# Patient Record
Sex: Female | Born: 1985 | Race: White | Hispanic: No | Marital: Single | Smoking: Current every day smoker
Health system: Southern US, Community
[De-identification: ages and names within clinical notes are randomized; demographics above are authoritative.]

## PROBLEM LIST (undated history)

## (undated) ENCOUNTER — Inpatient Hospital Stay (HOSPITAL_COMMUNITY): Payer: Self-pay

## (undated) VITALS — BP 104/74 | HR 102 | Temp 98.2°F | Resp 16 | Ht 63.0 in | Wt 116.0 lb

## (undated) DIAGNOSIS — R112 Nausea with vomiting, unspecified: Secondary | ICD-10-CM

## (undated) DIAGNOSIS — N390 Urinary tract infection, site not specified: Secondary | ICD-10-CM

## (undated) DIAGNOSIS — F329 Major depressive disorder, single episode, unspecified: Secondary | ICD-10-CM

## (undated) DIAGNOSIS — J45909 Unspecified asthma, uncomplicated: Secondary | ICD-10-CM

## (undated) DIAGNOSIS — R87629 Unspecified abnormal cytological findings in specimens from vagina: Secondary | ICD-10-CM

## (undated) DIAGNOSIS — G43909 Migraine, unspecified, not intractable, without status migrainosus: Secondary | ICD-10-CM

## (undated) DIAGNOSIS — N809 Endometriosis, unspecified: Secondary | ICD-10-CM

## (undated) DIAGNOSIS — F32A Depression, unspecified: Secondary | ICD-10-CM

## (undated) DIAGNOSIS — IMO0002 Reserved for concepts with insufficient information to code with codable children: Secondary | ICD-10-CM

## (undated) DIAGNOSIS — Z9889 Other specified postprocedural states: Secondary | ICD-10-CM

## (undated) DIAGNOSIS — F4310 Post-traumatic stress disorder, unspecified: Secondary | ICD-10-CM

## (undated) DIAGNOSIS — F419 Anxiety disorder, unspecified: Secondary | ICD-10-CM

## (undated) HISTORY — PX: WISDOM TOOTH EXTRACTION: SHX21

## (undated) HISTORY — PX: OTHER SURGICAL HISTORY: SHX169

## (undated) HISTORY — DX: Migraine, unspecified, not intractable, without status migrainosus: G43.909

## (undated) HISTORY — DX: Unspecified abnormal cytological findings in specimens from vagina: R87.629

## (undated) HISTORY — DX: Reserved for concepts with insufficient information to code with codable children: IMO0002

---
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
## 2005-04-16 ENCOUNTER — Emergency Department (HOSPITAL_COMMUNITY): Admission: EM | Admit: 2005-04-16 | Discharge: 2005-04-16 | Payer: Self-pay | Admitting: Emergency Medicine

## 2005-05-20 ENCOUNTER — Emergency Department (HOSPITAL_COMMUNITY): Admission: EM | Admit: 2005-05-20 | Discharge: 2005-05-21 | Payer: Self-pay | Admitting: Emergency Medicine

## 2006-07-09 ENCOUNTER — Encounter: Admission: RE | Admit: 2006-07-09 | Discharge: 2006-08-06 | Payer: Self-pay | Admitting: Family Medicine

## 2007-07-09 ENCOUNTER — Ambulatory Visit (HOSPITAL_COMMUNITY): Admission: RE | Admit: 2007-07-09 | Discharge: 2007-07-09 | Payer: Self-pay | Admitting: Pediatrics

## 2007-07-16 ENCOUNTER — Ambulatory Visit (HOSPITAL_COMMUNITY): Admission: RE | Admit: 2007-07-16 | Discharge: 2007-07-16 | Payer: Self-pay | Admitting: Pediatrics

## 2008-05-25 ENCOUNTER — Other Ambulatory Visit: Admission: RE | Admit: 2008-05-25 | Discharge: 2008-05-25 | Payer: Self-pay | Admitting: Obstetrics and Gynecology

## 2009-04-21 DIAGNOSIS — IMO0002 Reserved for concepts with insufficient information to code with codable children: Secondary | ICD-10-CM

## 2009-04-21 DIAGNOSIS — R87619 Unspecified abnormal cytological findings in specimens from cervix uteri: Secondary | ICD-10-CM

## 2009-04-21 HISTORY — DX: Reserved for concepts with insufficient information to code with codable children: IMO0002

## 2009-04-21 HISTORY — DX: Unspecified abnormal cytological findings in specimens from cervix uteri: R87.619

## 2010-01-08 ENCOUNTER — Emergency Department (HOSPITAL_COMMUNITY): Admission: EM | Admit: 2010-01-08 | Discharge: 2010-01-08 | Payer: Self-pay | Admitting: Emergency Medicine

## 2010-01-28 ENCOUNTER — Emergency Department (HOSPITAL_COMMUNITY): Admission: EM | Admit: 2010-01-28 | Discharge: 2010-01-29 | Payer: Self-pay | Admitting: Emergency Medicine

## 2010-07-04 LAB — POCT PREGNANCY, URINE: Preg Test, Ur: NEGATIVE

## 2011-01-21 ENCOUNTER — Emergency Department (HOSPITAL_COMMUNITY)
Admission: EM | Admit: 2011-01-21 | Discharge: 2011-01-21 | Disposition: A | Discharge status: Home / Self Care | Payer: Self-pay | Attending: Emergency Medicine | Admitting: Emergency Medicine

## 2011-01-21 DIAGNOSIS — J40 Bronchitis, not specified as acute or chronic: Secondary | ICD-10-CM | POA: Insufficient documentation

## 2011-01-21 DIAGNOSIS — L509 Urticaria, unspecified: Secondary | ICD-10-CM | POA: Insufficient documentation

## 2011-01-21 DIAGNOSIS — F17200 Nicotine dependence, unspecified, uncomplicated: Secondary | ICD-10-CM | POA: Insufficient documentation

## 2011-01-21 DIAGNOSIS — F411 Generalized anxiety disorder: Secondary | ICD-10-CM | POA: Insufficient documentation

## 2011-01-27 ENCOUNTER — Emergency Department (HOSPITAL_COMMUNITY)
Admission: EM | Admit: 2011-01-27 | Discharge: 2011-01-27 | Disposition: A | Discharge status: Home / Self Care | Payer: Self-pay | Attending: Emergency Medicine | Admitting: Emergency Medicine

## 2011-01-27 DIAGNOSIS — L509 Urticaria, unspecified: Secondary | ICD-10-CM | POA: Insufficient documentation

## 2011-01-27 DIAGNOSIS — F411 Generalized anxiety disorder: Secondary | ICD-10-CM | POA: Insufficient documentation

## 2011-04-24 DIAGNOSIS — F329 Major depressive disorder, single episode, unspecified: Secondary | ICD-10-CM | POA: Insufficient documentation

## 2011-04-24 DIAGNOSIS — F32A Depression, unspecified: Secondary | ICD-10-CM | POA: Insufficient documentation

## 2011-04-24 DIAGNOSIS — F411 Generalized anxiety disorder: Secondary | ICD-10-CM | POA: Insufficient documentation

## 2011-04-24 DIAGNOSIS — F410 Panic disorder [episodic paroxysmal anxiety] without agoraphobia: Secondary | ICD-10-CM | POA: Insufficient documentation

## 2011-12-31 ENCOUNTER — Emergency Department (HOSPITAL_COMMUNITY)
Admission: EM | Admit: 2011-12-31 | Discharge: 2011-12-31 | Disposition: A | Discharge status: Home / Self Care | Payer: Self-pay | Attending: Emergency Medicine | Admitting: Emergency Medicine

## 2011-12-31 ENCOUNTER — Emergency Department (HOSPITAL_COMMUNITY): Payer: Self-pay

## 2011-12-31 ENCOUNTER — Encounter (HOSPITAL_COMMUNITY): Payer: Self-pay | Admitting: Emergency Medicine

## 2011-12-31 DIAGNOSIS — M549 Dorsalgia, unspecified: Secondary | ICD-10-CM | POA: Insufficient documentation

## 2011-12-31 DIAGNOSIS — R109 Unspecified abdominal pain: Secondary | ICD-10-CM | POA: Insufficient documentation

## 2011-12-31 DIAGNOSIS — R300 Dysuria: Secondary | ICD-10-CM | POA: Insufficient documentation

## 2011-12-31 DIAGNOSIS — R10819 Abdominal tenderness, unspecified site: Secondary | ICD-10-CM | POA: Insufficient documentation

## 2011-12-31 DIAGNOSIS — J3489 Other specified disorders of nose and nasal sinuses: Secondary | ICD-10-CM | POA: Insufficient documentation

## 2011-12-31 HISTORY — DX: Depression, unspecified: F32.A

## 2011-12-31 HISTORY — DX: Post-traumatic stress disorder, unspecified: F43.10

## 2011-12-31 HISTORY — DX: Anxiety disorder, unspecified: F41.9

## 2011-12-31 HISTORY — DX: Major depressive disorder, single episode, unspecified: F32.9

## 2011-12-31 HISTORY — DX: Urinary tract infection, site not specified: N39.0

## 2011-12-31 LAB — BASIC METABOLIC PANEL
Calcium: 9 mg/dL (ref 8.4–10.5)
Creatinine, Ser: 0.81 mg/dL (ref 0.50–1.10)
GFR calc Af Amer: >90 mL/min (ref >90)
GFR calc non Af Amer: >90 mL/min (ref >90)
Sodium: 137 mEq/L (ref 135–145)

## 2011-12-31 LAB — CBC WITH DIFFERENTIAL/PLATELET
Basophils Absolute: 0 10*3/uL (ref 0.0–0.1)
Basophils Relative: 1 % (ref 0–1)
Eosinophils Relative: 3 % (ref 0–5)
HCT: 40.5 % (ref 36.0–46.0)
MCHC: 35.1 g/dL (ref 30.0–36.0)
MCV: 89.4 fL (ref 78.0–100.0)
Monocytes Absolute: 0.5 10*3/uL (ref 0.1–1.0)
Platelets: 239 10*3/uL (ref 150–400)
RDW: 12.2 % (ref 11.5–15.5)
WBC: 8.7 10*3/uL (ref 4.0–10.5)

## 2011-12-31 LAB — URINALYSIS, ROUTINE W REFLEX MICROSCOPIC
Ketones, ur: NEGATIVE mg/dL
Leukocytes, UA: NEGATIVE
Nitrite: NEGATIVE
Protein, ur: NEGATIVE mg/dL
Urobilinogen, UA: 0.2 mg/dL (ref 0.0–1.0)

## 2011-12-31 LAB — WET PREP, GENITAL: Clue Cells Wet Prep HPF POC: NONE SEEN

## 2011-12-31 MED ORDER — ONDANSETRON 8 MG PO TBDP
8.0000 mg | ORAL_TABLET | Freq: Once | ORAL | Status: AC
  Administered 2011-12-31: 8 mg via ORAL
  Filled 2011-12-31: qty 1

## 2011-12-31 MED ORDER — OXYCODONE-ACETAMINOPHEN 5-325 MG PO TABS: 1.0000 | ORAL_TABLET | Freq: Four times a day (QID) | ORAL | Status: AC | PRN

## 2011-12-31 MED ORDER — SODIUM CHLORIDE 0.9 % IV BOLUS (SEPSIS)
1000.0000 mL | Freq: Once | INTRAVENOUS | Status: AC
  Administered 2011-12-31: 1000 mL via INTRAVENOUS

## 2011-12-31 MED ORDER — OXYCODONE-ACETAMINOPHEN 5-325 MG PO TABS
2.0000 | ORAL_TABLET | Freq: Once | ORAL | Status: AC
  Administered 2011-12-31: 2 via ORAL
  Filled 2011-12-31: qty 2

## 2011-12-31 MED ORDER — DOXYCYCLINE HYCLATE 100 MG PO CAPS: 100.0000 mg | ORAL_CAPSULE | Freq: Two times a day (BID) | ORAL | Status: AC

## 2011-12-31 NOTE — ED Provider Notes (Signed)
Medical screening examination/treatment/procedure(s) were conducted as a shared visit with non-physician practitioner(s) and myself.  I personally evaluated the patient during the encounter  Toy Baker, MD 12/31/11 2024

## 2011-12-31 NOTE — ED Provider Notes (Signed)
History     CSN: [id number]  Arrival date & time 12/31/11  1042   First MD Initiated Contact with Patient 12/31/11 1146      Chief Complaint  Patient presents with  . Urinary Tract Infection  . Pyelonephritis    (Consider location/radiation/quality/duration/timing/severity/associated sxs/prior treatment) HPI Comments: Pamela Gallagher 26 y.o. female   The chief complaint is: Patient presents with:   Urinary Tract Infection   Pyelonephritis   The patient has medical history significant for:   Past Medical History:   UTI (lower urinary tract infection)                          PTSD (post-traumatic stress disorder)                        Anxiety and depression                                      Patient presents with urinary symptoms of dysuria, frequency, urgency for 1.5 months. Patiens state she took 2 different antibiotics, one she can't remember and 10 days of Levaquin. Associated symptoms include nausea, back pain, and hematuria. LMP: now. Sexually active with one partner, does not use barrier. Denies fever or chills. Denies vomiting and diarrhea.  Patient also reports productive cough with greenish sputum, an congestion and sinus pressure and believes she has a sinus infection. She has had sinus infections in the past and this seems similar. Denies sore throat but reports mild epsitaxis.       The history is provided by the patient.    Past Medical History  Diagnosis Date  . UTI (lower urinary tract infection)   . PTSD (post-traumatic stress disorder)   . Anxiety and depression     Past Surgical History  Procedure Date  . Thyroglossoduct cyst     x 2  . Wisdom tooth extraction     History reviewed. No pertinent family history.  History  Substance Use Topics  . Smoking status: Current Every Day Smoker -- .2 years  . Smokeless tobacco: Not on file  . Alcohol Use: No     rarely    OB History    Grav Para Term Preterm Abortions TAB SAB Ect Mult Living                   Review of Systems  Constitutional: Negative for fever and chills.  HENT: Positive for nosebleeds, congestion, rhinorrhea and sinus pressure. Negative for sore throat.   Gastrointestinal: Negative for nausea, vomiting, abdominal pain and diarrhea.  Genitourinary: Positive for dysuria, urgency, hematuria and difficulty urinating. Negative for vaginal discharge.  Musculoskeletal: Positive for back pain.  All other systems reviewed and are negative.    Allergies  Clindamycin/lincomycin; Penicillins; and Sulfa antibiotics  Home Medications   Current Outpatient Rx  Name Route Sig Dispense Refill  . ALBUTEROL SULFATE HFA 108 (90 BASE) MCG/ACT IN AERS Inhalation Inhale 2 puffs into the lungs every 6 (six) hours as needed. Wheezing    . ALPRAZOLAM 2 MG PO TABS Oral Take 2 mg by mouth 3 (three) times daily as needed. Anxiety    . CITALOPRAM HYDROBROMIDE 40 MG PO TABS Oral Take 40 mg by mouth daily.      BP 113/53  Pulse 75  Temp 98 F (36.7 C) (Oral)  Resp 16  SpO2 100%  LMP 12/02/2011  Physical Exam  Nursing note and vitals reviewed. Constitutional: She appears well-developed and well-nourished.  HENT:  Head: Normocephalic and atraumatic.  Mouth/Throat: Oropharynx is clear and moist.       Patient has tenderness to palpation of her maxillary and frontal sinuses.  Eyes: Conjunctivae normal and EOM are normal. No scleral icterus.  Neck: Normal range of motion. Neck supple.  Cardiovascular: Normal rate, regular rhythm and normal heart sounds.   Pulmonary/Chest: Effort normal and breath sounds normal. She has no wheezes.  Abdominal: Soft. Bowel sounds are normal. There is tenderness.       Suprapubic and CVA tenderness on exam.  Lymphadenopathy:    She has no cervical adenopathy.  Neurological: She is alert.  Skin: Skin is warm and dry.    ED Course  Procedures (including critical care time)   Labs Reviewed  URINALYSIS, ROUTINE W REFLEX MICROSCOPIC  CBC  WITH DIFFERENTIAL   Results for orders placed during the hospital encounter of 12/31/11  CBC WITH DIFFERENTIAL      Component Value Range   WBC 8.7  4.0 - 10.5 K/uL   RBC 4.53  3.87 - 5.11 MIL/uL   Hemoglobin 14.2  12.0 - 15.0 g/dL   HCT 81.1  91.4 - 78.2 %   MCV 89.4  78.0 - 100.0 fL   MCH 31.3  26.0 - 34.0 pg   MCHC 35.1  30.0 - 36.0 g/dL   RDW 95.6  21.3 - 08.6 %   Platelets 239  150 - 400 K/uL   Neutrophils Relative 63  43 - 77 %   Neutro Abs 5.5  1.7 - 7.7 K/uL   Lymphocytes Relative 28  12 - 46 %   Lymphs Abs 2.5  0.7 - 4.0 K/uL   Monocytes Relative 6  3 - 12 %   Monocytes Absolute 0.5  0.1 - 1.0 K/uL   Eosinophils Relative 3  0 - 5 %   Eosinophils Absolute 0.3  0.0 - 0.7 K/uL   Basophils Relative 1  0 - 1 %   Basophils Absolute 0.0  0.0 - 0.1 K/uL  BASIC METABOLIC PANEL      Component Value Range   Sodium 137  135 - 145 mEq/L   Potassium 3.9  3.5 - 5.1 mEq/L   Chloride 101  96 - 112 mEq/L   CO2 27  19 - 32 mEq/L   Glucose, Bld 84  70 - 99 mg/dL   BUN 7  6 - 23 mg/dL   Creatinine, Ser 5.78  0.50 - 1.10 mg/dL   Calcium 9.0  8.4 - 46.9 mg/dL   GFR calc non Af Amer >90  >90 mL/min   GFR calc Af Amer >90  >90 mL/min  URINALYSIS, ROUTINE W REFLEX MICROSCOPIC      Component Value Range   Color, Urine YELLOW  YELLOW   APPearance CLOUDY (*) CLEAR   Specific Gravity, Urine 1.024  1.005 - 1.030   pH 6.0  5.0 - 8.0   Glucose, UA NEGATIVE  NEGATIVE mg/dL   Hgb urine dipstick NEGATIVE  NEGATIVE   Bilirubin Urine NEGATIVE  NEGATIVE   Ketones, ur NEGATIVE  NEGATIVE mg/dL   Protein, ur NEGATIVE  NEGATIVE mg/dL   Urobilinogen, UA 0.2  0.0 - 1.0 mg/dL   Nitrite NEGATIVE  NEGATIVE   Leukocytes, UA NEGATIVE  NEGATIVE  POCT PREGNANCY, URINE      Component Value Range   Preg  Test, Ur NEGATIVE  NEGATIVE  WET PREP, GENITAL      Component Value Range   Yeast Wet Prep HPF POC NONE SEEN  NONE SEEN   Trich, Wet Prep NONE SEEN  NONE SEEN   Clue Cells Wet Prep HPF POC NONE SEEN  NONE  SEEN   WBC, Wet Prep HPF POC FEW (*) NONE SEEN    No results found.   1. Abdominal pain   2. Dysuria       MDM  Patient presents with UTI symptoms for one month. Back flank pain 8/10 with associated nausea. Patient given pain medication and Zofran with improves. Patient also given fluids as she has not been eating or drinkink as much due to pain. CBC:,CMP, UA, UPT: unremarkable CT abdomen pelvis without contrast:unremarkable other than a moderate amount of stool. Patient  discharged on ABX for sinusitis. No red flags for kidney stone, pyelonephritis, or perinephric abscess.  Return precautions given verbally and in discharge summary.        Pixie Casino, PA-C 12/31/11 1612

## 2011-12-31 NOTE — ED Notes (Signed)
Seen at [facility] 1 month ago for UTI-- took 10 days of antibiotics, did not get better, went back, given another antibiotic-- still not better. Still having dysuria, frequency

## 2012-01-01 LAB — GC/CHLAMYDIA PROBE AMP, GENITAL: Chlamydia, DNA Probe: NEGATIVE

## 2012-01-14 ENCOUNTER — Encounter: Payer: Self-pay | Admitting: Advanced Practice Midwife

## 2012-01-14 ENCOUNTER — Ambulatory Visit (INDEPENDENT_AMBULATORY_CARE_PROVIDER_SITE_OTHER): Payer: Self-pay | Admitting: Obstetrics & Gynecology

## 2012-01-14 VITALS — BP 115/73 | HR 88 | Temp 96.6°F | Resp 20 | Ht 63.0 in | Wt 119.5 lb

## 2012-01-14 DIAGNOSIS — R300 Dysuria: Secondary | ICD-10-CM | POA: Insufficient documentation

## 2012-01-14 LAB — POCT URINALYSIS DIP (DEVICE)
Glucose, UA: NEGATIVE mg/dL
Specific Gravity, Urine: 1.02 (ref 1.005–1.030)
Urobilinogen, UA: 2 mg/dL — ABNORMAL HIGH (ref 0.0–1.0)

## 2012-01-14 MED ORDER — PHENAZOPYRIDINE HCL 95 MG PO TABS: 95.0000 mg | ORAL_TABLET | Freq: Three times a day (TID) | ORAL | Status: DC | PRN

## 2012-01-14 MED ORDER — NITROFURANTOIN MONOHYD MACRO 100 MG PO CAPS: 100.0000 mg | ORAL_CAPSULE | ORAL | Status: DC

## 2012-01-14 NOTE — Progress Notes (Signed)
Pt also desires referral to PCP- her previous PCP has changed to [facility].

## 2012-01-14 NOTE — Progress Notes (Signed)
Subjective:     Patient ID: Pamela Gallagher, female   DOB: March 29, 1986, 26 y.o.   MRN: [id number]  HPI Pt reports a h/o freq UIT's.  Pt reports that she was seen several times and treated for hemorrhagic cystitis.      Pt c/o current pain.  No blood in urine.   Review of Systems     Objective:   Physical ExamBP 115/73  Pulse 88  Temp 96.6 F (35.9 C) (Oral)  Resp 20  Ht 5\' 3"  (1.6 m)  Wt 119 lb 8 oz (54.205 kg)  BMI 21.17 kg/m2  LMP 12/25/2011  Pt in NAD Lungs: CTA CV: RRR Abd: soft, ND, NT; no suprapubic tenderness GU: EGBUS: no lesions Vagina: no blood in vault Cervix: no lesion; no mucopurulent d/c Uterus: small, mobile Adnexa: no masses; sl tender   UA: No blood Neg leuk Neg nit  RADIOLOGY REPORT*  Clinical Data: Right flank pain for 1 month  CT ABDOMEN AND PELVIS WITHOUT CONTRAST  Technique: Multidetector CT imaging of the abdomen and pelvis was  performed following the standard protocol without intravenous  contrast.  Comparison: None.  Findings: Sagittal images of the spine are unremarkable. The lung  bases are unremarkable. Unenhanced liver shows no biliary ductal  dilatation. No calcified gallstones are noted within gallbladder.  Unenhanced pancreas, spleen and adrenal glands are unremarkable.  No aortic aneurysm.  Unenhanced kidneys are symmetrical in size. No nephrolithiasis.  No hydronephrosis or hydroureter. No calcified ureteral calculi  are noted. Moderate stool in transverse colon.  No small bowel obstruction. No ascites or free air. No  adenopathy.  There is no pericecal inflammation. Normal appendix is partially  visualized in axial image 51.  Moderate stool noted in the rectosigmoid colon. Stool noted in the  left colon. No distal colonic obstruction. The unenhanced uterus  and adnexa are unremarkable. Bilateral distal ureter is  unremarkable. No calcified calculi are noted within urinary  bladder. No inguinal adenopathy. No destructive bony  lesions are  noted within pelvis. Small nonspecific bilateral inguinal lymph  nodes.  IMPRESSION:  1. No nephrolithiasis. No hydronephrosis or hydroureter.  2. No calcified ureteral calculi are noted.  3. No pericecal inflammation. Normal appendix is partially  visualized.  4. Moderate colonic stool. No distal colonic obstruction.       Assessment:     H/o freq UIT's . No active sx.     Plan:     macrobid 1 po q day Pyridium prn Fu. 4 weeks  Rochella Benner L. Harraway-Smith, M.D., [facility]

## 2012-01-14 NOTE — Patient Instructions (Addendum)

## 2012-01-15 ENCOUNTER — Telehealth: Payer: Self-pay | Admitting: *Deleted

## 2012-01-15 NOTE — Telephone Encounter (Signed)
Pt left message w/question about her prescriptions. She stated that the pharmacy told her that one of the meds "was not ready." She is wondering if both prescriptions were sent. I returned her call and left message on her personal voice mail. I stated that our records indicate that both prescriptions were sent in. Maybe the pharmacy meant that they had not prepared one of the medications @ the time she was there to pick up. She may leave a new message if she still has questions or is having problems obtaining her medications.

## 2012-01-21 ENCOUNTER — Other Ambulatory Visit: Payer: Self-pay | Admitting: *Deleted

## 2012-01-21 DIAGNOSIS — R300 Dysuria: Secondary | ICD-10-CM

## 2012-01-21 MED ORDER — NITROFURANTOIN MONOHYD MACRO 100 MG PO CAPS: 100.0000 mg | ORAL_CAPSULE | ORAL | Status: DC

## 2012-01-21 MED ORDER — PHENAZOPYRIDINE HCL 95 MG PO TABS: 95.0000 mg | ORAL_TABLET | Freq: Three times a day (TID) | ORAL | Status: DC | PRN

## 2012-01-21 NOTE — Progress Notes (Signed)
Pt states that her rx was not sent to the right pharmacy. Will resend to [facility] on [address]

## 2012-01-24 ENCOUNTER — Encounter (HOSPITAL_COMMUNITY): Payer: Self-pay | Admitting: Emergency Medicine

## 2012-01-24 ENCOUNTER — Emergency Department (HOSPITAL_COMMUNITY)
Admission: EM | Admit: 2012-01-24 | Discharge: 2012-01-25 | Disposition: A | Discharge status: Home / Self Care | Payer: Self-pay | Attending: Emergency Medicine | Admitting: Emergency Medicine

## 2012-01-24 DIAGNOSIS — R0602 Shortness of breath: Secondary | ICD-10-CM | POA: Insufficient documentation

## 2012-01-24 DIAGNOSIS — J45909 Unspecified asthma, uncomplicated: Secondary | ICD-10-CM | POA: Insufficient documentation

## 2012-01-24 DIAGNOSIS — J45901 Unspecified asthma with (acute) exacerbation: Secondary | ICD-10-CM

## 2012-01-24 DIAGNOSIS — IMO0001 Reserved for inherently not codable concepts without codable children: Secondary | ICD-10-CM | POA: Insufficient documentation

## 2012-01-24 HISTORY — DX: Unspecified asthma, uncomplicated: J45.909

## 2012-01-24 MED ORDER — METHYLPREDNISOLONE SODIUM SUCC 125 MG IJ SOLR
INTRAMUSCULAR | Status: AC
  Administered 2012-01-24: 22:00:00
  Filled 2012-01-24: qty 2

## 2012-01-24 MED ORDER — ACETAMINOPHEN 325 MG PO TABS
650.0000 mg | ORAL_TABLET | Freq: Once | ORAL | Status: AC
  Administered 2012-01-24: 650 mg via ORAL
  Filled 2012-01-24: qty 2

## 2012-01-24 MED ORDER — IPRATROPIUM BROMIDE 0.02 % IN SOLN
RESPIRATORY_TRACT | Status: AC
  Administered 2012-01-24: 22:00:00
  Filled 2012-01-24: qty 2.5

## 2012-01-24 MED ORDER — SODIUM CHLORIDE 0.9 % IV BOLUS (SEPSIS)
1000.0000 mL | Freq: Once | INTRAVENOUS | Status: AC
  Administered 2012-01-24: 1000 mL via INTRAVENOUS

## 2012-01-24 MED ORDER — ALBUTEROL SULFATE HFA 108 (90 BASE) MCG/ACT IN AERS
2.0000 | INHALATION_SPRAY | RESPIRATORY_TRACT | Status: DC | PRN
  Administered 2012-01-24: 2 via RESPIRATORY_TRACT
  Filled 2012-01-24: qty 6.7

## 2012-01-24 MED ORDER — ALBUTEROL SULFATE (5 MG/ML) 0.5% IN NEBU
INHALATION_SOLUTION | RESPIRATORY_TRACT | Status: AC
  Administered 2012-01-24: 22:00:00
  Filled 2012-01-24: qty 2

## 2012-01-24 NOTE — ED Notes (Signed)
As per EMS pt was SOB and was not moving air. Pt was given 10mg albuterol, .5mg  arrovent and 125mg  solumedrol en route. VSS.

## 2012-01-24 NOTE — ED Provider Notes (Signed)
History     CSN: [id number]  Arrival date & time 01/24/12  2159   First MD Initiated Contact with Patient 01/24/12 2202      Chief Complaint  Patient presents with  . Asthma    (Consider location/radiation/quality/duration/timing/severity/associated sxs/prior treatment) HPI Comments: Patient history of, asthma, was at a wedding today in the heat and noticed, that she was having some shortness of breath, but because her inhaler.  Has been running low she put off using it until she was extremely short of breath.  Her friend/roommate preformed 2 chest compressions -she was worried about her breathing.  By the time the patient arrived in the emergency department.  She was wheezing slightly, but moving air, EMS administered 10 mg albuterol 0.5 mg, Atrovent, and 125 of Solu-Medrol  Patient is a 26 y.o. female presenting with asthma. The history is provided by the patient.  Asthma This is a new problem. The current episode started today. The problem has been rapidly improving. Associated symptoms include myalgias. Pertinent negatives include no chills, coughing, fever, headaches or weakness.    Past Medical History  Diagnosis Date  . UTI (lower urinary tract infection)   . PTSD (post-traumatic stress disorder)   . Anxiety and depression   . Abnormal Pap smear 2011  . Asthma     Past Surgical History  Procedure Date  . Thyroglossoduct cyst     x 2  . Wisdom tooth extraction     Family History  Problem Relation Age of Onset  . Depression Mother   . Anxiety disorder Mother   . Depression Father   . Hearing loss Paternal Uncle     History  Substance Use Topics  . Smoking status: Current Every Day Smoker -- 0.2 packs/day for .5 years    Types: Cigarettes  . Smokeless tobacco: Not on file  . Alcohol Use: No     rarely    OB History    Grav Para Term Preterm Abortions TAB SAB Ect Mult Living   0               Review of Systems  Constitutional: Negative for fever and  chills.  Respiratory: Positive for shortness of breath and wheezing. Negative for cough.   Musculoskeletal: Positive for myalgias.  Neurological: Negative for dizziness, weakness and headaches.    Allergies  Clindamycin/lincomycin; Penicillins; and Sulfa antibiotics  Home Medications   Current Outpatient Rx  Name Route Sig Dispense Refill  . ALBUTEROL SULFATE HFA 108 (90 BASE) MCG/ACT IN AERS Inhalation Inhale 2 puffs into the lungs every 6 (six) hours as needed. Wheezing    . ALPRAZOLAM 2 MG PO TABS Oral Take 2 mg by mouth 3 (three) times daily as needed. Anxiety    . CALCIUM CARBONATE ANTACID 500 MG PO CHEW Oral Chew 1 tablet by mouth daily as needed. For heartburn    . CITALOPRAM HYDROBROMIDE 40 MG PO TABS Oral Take 40 mg by mouth daily.    Marland Kitchen NITROFURANTOIN MONOHYD MACRO 100 MG PO CAPS Oral Take 100 mg by mouth daily.    Marland Kitchen PHENAZOPYRIDINE HCL 95 MG PO TABS Oral Take 1 tablet (95 mg total) by mouth 3 (three) times daily as needed for pain. 10 tablet 0  . ALBUTEROL SULFATE HFA 108 (90 BASE) MCG/ACT IN AERS Inhalation Inhale 1-2 puffs into the lungs every 6 (six) hours as needed for wheezing. 1 Inhaler 0  . PREDNISONE 10 MG PO TABS Oral Take 2 tablets (20 mg total)  by mouth daily. 15 tablet 0    BP 101/42  Pulse 107  Resp 20  SpO2 99%  LMP 12/25/2011  Physical Exam  Constitutional: She appears well-developed.  HENT:  Head: Normocephalic.  Eyes: Pupils are equal, round, and reactive to light.  Neck: Normal range of motion.  Cardiovascular: Normal rate.   Pulmonary/Chest: Effort normal and breath sounds normal. No respiratory distress. She has no wheezes. She has no rales. She exhibits tenderness.  Abdominal: She exhibits no distension.  Musculoskeletal: Normal range of motion.  Neurological: She is alert.  Skin: Skin is warm.    ED Course  Procedures (including critical care time)  Labs Reviewed - No data to display No results found.   1. Asthma attack       MDM    Will continue to monitor and provide patient with albuterol inhaler, Rx for Prednisone and referrals for PCP         Arman Filter, NP 01/25/12 0021

## 2012-01-25 MED ORDER — PREDNISONE 10 MG PO TABS: 20.0000 mg | ORAL_TABLET | Freq: Every day | ORAL | Status: DC

## 2012-01-25 MED ORDER — ALBUTEROL SULFATE HFA 108 (90 BASE) MCG/ACT IN AERS: 1.0000 | INHALATION_SPRAY | Freq: Four times a day (QID) | RESPIRATORY_TRACT | Status: DC | PRN

## 2012-01-25 NOTE — ED Provider Notes (Signed)
History/physical exam/procedure(s) were performed by non-physician practitioner and as supervising physician I was immediately available for consultation/collaboration. I have reviewed all notes and am in agreement with care and plan.   Hilario Quarry, MD 01/25/12 [phone]

## 2012-02-10 ENCOUNTER — Ambulatory Visit: Payer: Self-pay | Admitting: Emergency Medicine

## 2012-02-10 ENCOUNTER — Ambulatory Visit: Payer: Self-pay

## 2012-02-10 VITALS — BP 100/84 | HR 93 | Temp 98.5°F | Resp 16 | Ht 63.0 in | Wt 118.8 lb

## 2012-02-10 DIAGNOSIS — R079 Chest pain, unspecified: Secondary | ICD-10-CM

## 2012-02-10 DIAGNOSIS — R071 Chest pain on breathing: Secondary | ICD-10-CM

## 2012-02-10 DIAGNOSIS — R0789 Other chest pain: Secondary | ICD-10-CM | POA: Insufficient documentation

## 2012-02-10 MED ORDER — MELOXICAM 15 MG PO TABS: 15.0000 mg | ORAL_TABLET | Freq: Every day | ORAL | Status: DC

## 2012-02-10 NOTE — Progress Notes (Addendum)
Pamela Gallagher is a 26 y.o. female who presents to [facility] today for left-sided chest pain starting this afternoon at 3 PM. Patient has acute nonradiating nonexertional chest pain worse with inspiration today. She says this is not consistent with prior panic attacks. She denies any fevers or chills. No palpitations or significant shortness of breath.  She says that on October 5 she started wheezing and stopped breathing her from her provided 2 rescue breaths and she presented to the emergency room where she was thought to possibly have asthma versus panic.  She is currently taking Xanax and Celexa for panic and PTSD. Her primary care provider recently moved and she is without a PCP.     PMH reviewed. Panic/PTSD History  Substance Use Topics  . Smoking status: Current Every Day Smoker -- 0.2 packs/day for .5 years    Types: Cigarettes  . Smokeless tobacco: Not on file  . Alcohol Use: No     rarely   ROS as above otherwise neg   Exam:  BP 100/84  Pulse 93  Temp 98.5 F (36.9 C) (Oral)  Resp 16  Ht 5\' 3"  (1.6 m)  Wt 118 lb 12.8 oz (53.887 kg)  BMI 21.04 kg/m2  SpO2 97%  LMP 01/20/2012 Gen: Well NAD Heent: Normal mucous membranes no significant lymphadenopathy Lungs: Clear to Auscultation bilaterally with normal work of breathing Chest: Tender to palpation left anterior chest wall Heart: Regular rate and rhythm no murmurs rubs or gallop Abdomen; normal active bowel sounds soft nontender nondistended Extremities warm and well perfused  Chest x-ray: Two-view chest x-ray reveals no acute changes normal appearing lung  EKG: Twelve-lead EKG shows normal sinus rhythm at 73 beats per minute with a normal axis and no interval abnormalities.  Assessment and plan:  26 year old woman with panic disorder with chest pain.  Chest is tender to palpation. Patient additionally has anxiety disorder. Think his pain is costochondritis. Plan to treat with meloxicam and reassurance. Discussed warning  signs or symptoms patient expresses understanding and agreement. Follow up with Dr. Merla Riches for management of anxiety disorder.    I have reviewed this patient's record and agree with the findings and treatment ordered. Carmelina Dane, M.D. I have reviewed and agree with documentation. Robert P. Merla Riches, M.D.

## 2012-02-10 NOTE — Patient Instructions (Addendum)
Thank you for coming in today. Please take the meloxicam daily as needed.  Please take a followup appointment with Dr. Merla Riches in the next few weeks. Come back if not getting better or worsening. Call or go to the emergency room if you get worse, have trouble breathing, have chest pains, or palpitations.

## 2012-02-11 ENCOUNTER — Encounter: Payer: Self-pay | Admitting: Obstetrics & Gynecology

## 2012-02-11 ENCOUNTER — Ambulatory Visit (INDEPENDENT_AMBULATORY_CARE_PROVIDER_SITE_OTHER): Payer: Self-pay | Admitting: Obstetrics & Gynecology

## 2012-02-11 VITALS — BP 121/77 | HR 96 | Temp 98.2°F | Ht 63.0 in | Wt 121.0 lb

## 2012-02-11 DIAGNOSIS — R300 Dysuria: Secondary | ICD-10-CM

## 2012-02-11 NOTE — Addendum Note (Signed)
Addended by: Carmelina Dane on: 02/11/2012 11:09 AM   Modules accepted: Level of Service

## 2012-02-11 NOTE — Patient Instructions (Signed)
Dysuria  Dysuria is the medical term for pain with urination. There are many causes for dysuria, but urinary tract infection is the most common. If a urinalysis was performed it can show that there is a urinary tract infection. A urine culture confirms that you or your child is sick. You will need to follow up with a healthcare provider because:  · If a urine culture was done you will need to know the culture results and treatment recommendations.  · If the urine culture was positive, you or your child will need to be put on antibiotics or know if the antibiotics prescribed are the right antibiotics for your urinary tract infection.  · If the urine culture is negative (no urinary tract infection), then other causes may need to be explored or antibiotics need to be stopped.  Today laboratory work may have been done and there does not seem to be an infection. If cultures were done they will take at least 24 to 48 hours to be completed.  Today x-rays may have been taken and they read as normal. No cause can be found for the problems. The x-rays may be re-read by a radiologist and you will be contacted if additional findings are made.  You or your child may have been put on medications to help with this problem until you can see your primary caregiver. If the problems get better, see your primary caregiver if the problems return. If you were given antibiotics (medications which kill germs), take all of the mediations as directed for the full course of treatment.   If laboratory work was done, you need to find the results. Leave a telephone number where you can be reached. If this is not possible, make sure you find out how you are to get test results.  HOME CARE INSTRUCTIONS   · Drink lots of fluids. For adults, drink eight, 8 ounce glasses of clear juice or water a day. For children, replace fluids as suggested by your caregiver.  · Empty the bladder often. Avoid holding urine for long periods of time.  · After a bowel  movement, women should cleanse front to back, using each tissue only once.  · Empty your bladder before and after sexual intercourse.  · Take all the medicine given to you until it is gone. You may feel better in a few days, but TAKE ALL MEDICINE.  · Avoid caffeine, tea, alcohol and carbonated beverages, because they tend to irritate the bladder.  · In men, alcohol may irritate the prostate.  · Only take over-the-counter or prescription medicines for pain, discomfort, or fever as directed by your caregiver.  · If your caregiver has given you a follow-up appointment, it is very important to keep that appointment. Not keeping the appointment could result in a chronic or permanent injury, pain, and disability. If there is any problem keeping the appointment, you must call back to this facility for assistance.  SEEK IMMEDIATE MEDICAL CARE IF:   · Back pain develops.  · A fever develops.  · There is nausea (feeling sick to your stomach) or vomiting (throwing up).  · Problems are no better with medications or are getting worse.  MAKE SURE YOU:   · Understand these instructions.  · Will watch your condition.  · Will get help right away if you are not doing well or get worse.  Document Released: 01/04/2004 Document Revised: 06/30/2011 Document Reviewed: 11/11/2007  [employer]® Patient Information ©2013 [employer].

## 2012-02-11 NOTE — Progress Notes (Signed)
Subjective:     Patient ID: Pamela Gallagher, female   DOB: May 05, 1985, 26 y.o.   MRN: [id number]  HPI Pt reports that her sx have decreased since her last visit when she started Macrobid daily.  She is a little worried because she has not been able to test her sx with her cycle but, will have her cycle next week.   Review of Systems     Objective:   Physical ExamBP 121/77  Pulse 96  Temp 98.2 F (36.8 C)  Ht 5\' 3"  (1.6 m)  Wt 121 lb (54.885 kg)  BMI 21.43 kg/m2  LMP 01/20/2012 Exam-deferred     Assessment:     Dysuria- improved with daily Macrobid     Plan:     Keep Macrobid daily  Pyridium prn F/u 4months or sooner prn  [name] L. Harraway-Smith, M.D., [facility]

## 2012-03-29 ENCOUNTER — Emergency Department (HOSPITAL_COMMUNITY)
Admission: EM | Admit: 2012-03-29 | Discharge: 2012-03-30 | Disposition: A | Discharge status: Home / Self Care | Payer: Self-pay | Attending: Emergency Medicine | Admitting: Emergency Medicine

## 2012-03-29 ENCOUNTER — Encounter (HOSPITAL_COMMUNITY): Payer: Self-pay | Admitting: *Deleted

## 2012-03-29 DIAGNOSIS — Z7982 Long term (current) use of aspirin: Secondary | ICD-10-CM | POA: Insufficient documentation

## 2012-03-29 DIAGNOSIS — Z79899 Other long term (current) drug therapy: Secondary | ICD-10-CM | POA: Insufficient documentation

## 2012-03-29 DIAGNOSIS — F341 Dysthymic disorder: Secondary | ICD-10-CM | POA: Insufficient documentation

## 2012-03-29 DIAGNOSIS — F17200 Nicotine dependence, unspecified, uncomplicated: Secondary | ICD-10-CM | POA: Insufficient documentation

## 2012-03-29 DIAGNOSIS — Z3202 Encounter for pregnancy test, result negative: Secondary | ICD-10-CM | POA: Insufficient documentation

## 2012-03-29 DIAGNOSIS — F4310 Post-traumatic stress disorder, unspecified: Secondary | ICD-10-CM | POA: Insufficient documentation

## 2012-03-29 DIAGNOSIS — J45909 Unspecified asthma, uncomplicated: Secondary | ICD-10-CM | POA: Insufficient documentation

## 2012-03-29 DIAGNOSIS — N390 Urinary tract infection, site not specified: Secondary | ICD-10-CM | POA: Insufficient documentation

## 2012-03-29 LAB — URINE MICROSCOPIC-ADD ON

## 2012-03-29 LAB — URINALYSIS, ROUTINE W REFLEX MICROSCOPIC
Glucose, UA: NEGATIVE mg/dL
pH: 6 (ref 5.0–8.0)

## 2012-03-29 LAB — PREGNANCY, URINE: Preg Test, Ur: NEGATIVE

## 2012-03-29 MED ORDER — HYDROMORPHONE HCL PF 1 MG/ML IJ SOLN
1.0000 mg | Freq: Once | INTRAMUSCULAR | Status: AC
  Administered 2012-03-29: 1 mg via INTRAVENOUS
  Filled 2012-03-29: qty 1

## 2012-03-29 MED ORDER — SODIUM CHLORIDE 0.9 % IV SOLN
1000.0000 mL | Freq: Once | INTRAVENOUS | Status: AC
  Administered 2012-03-29: 1000 mL via INTRAVENOUS

## 2012-03-29 MED ORDER — ONDANSETRON HCL 4 MG/2ML IJ SOLN
4.0000 mg | Freq: Once | INTRAMUSCULAR | Status: AC
  Administered 2012-03-29: 4 mg via INTRAVENOUS
  Filled 2012-03-29: qty 2

## 2012-03-29 MED ORDER — SODIUM CHLORIDE 0.9 % IV SOLN: 1000.0000 mL | INTRAVENOUS | Status: DC

## 2012-03-29 MED ORDER — CIPROFLOXACIN IN D5W 400 MG/200ML IV SOLN
400.0000 mg | Freq: Once | INTRAVENOUS | Status: AC
  Administered 2012-03-29: 400 mg via INTRAVENOUS
  Filled 2012-03-29: qty 200

## 2012-03-29 NOTE — ED Notes (Signed)
Pt c/o hesitancy; urinary urgency; pain; history of same

## 2012-03-29 NOTE — ED Provider Notes (Signed)
History     CSN: [id number]  Arrival date & time 03/29/12  2016   First MD Initiated Contact with Patient 03/29/12 2305      Chief Complaint  Patient presents with  . urinary symptoms      The history is provided by the patient.   the patient reports a history of recurring urinary tract infections.  Over the past 24 hours she's had urinary urgency hesitancy and dysuria.  She denies flank pain.  She's had nausea without vomiting.  She denies diarrhea.  She has no flank pain.  She denies significant abdominal pain except for some mild suprapubic tenderness.  She is currently working with several providers in an effort to determine why she's having recurring urinary tract infections.  She believes she may be developing endometriosis as well per her OB/GYN.  The patient's pain is moderate to severe at this time.  Nothing improves or worsens her symptoms  Past Medical History  Diagnosis Date  . UTI (lower urinary tract infection)   . PTSD (post-traumatic stress disorder)   . Anxiety and depression   . Abnormal Pap smear 2011  . Asthma     Past Surgical History  Procedure Date  . Thyroglossoduct cyst     x 2  . Wisdom tooth extraction     Family History  Problem Relation Age of Onset  . Depression Mother   . Anxiety disorder Mother   . Depression Father   . Hearing loss Paternal Uncle     History  Substance Use Topics  . Smoking status: Current Every Day Smoker -- 0.2 packs/day for .5 years    Types: Cigarettes  . Smokeless tobacco: Never Used  . Alcohol Use: No     Comment: rarely    OB History    Grav Para Term Preterm Abortions TAB SAB Ect Mult Living   0               Review of Systems  All other systems reviewed and are negative.    Allergies  Clindamycin/lincomycin; Penicillins; and Sulfa antibiotics  Home Medications   Current Outpatient Rx  Name  Route  Sig  Dispense  Refill  . ALBUTEROL SULFATE HFA 108 (90 BASE) MCG/ACT IN AERS   Inhalation  Inhale 2 puffs into the lungs every 6 (six) hours as needed. Wheezing         . ALPRAZOLAM 2 MG PO TABS   Oral   Take 2 mg by mouth 3 (three) times daily as needed. Anxiety         . ASPIRIN-ACETAMINOPHEN-CAFFEINE 250-250-65 MG PO TABS   Oral   Take 2 tablets by mouth every 6 (six) hours as needed. For pain.         Marland Kitchen CALCIUM CARBONATE ANTACID 500 MG PO CHEW   Oral   Chew 1 tablet by mouth daily as needed. For heartburn         . CITALOPRAM HYDROBROMIDE 40 MG PO TABS   Oral   Take 40 mg by mouth daily.         . MELOXICAM 15 MG PO TABS   Oral   Take 1 tablet (15 mg total) by mouth daily.   30 tablet   0   . AZO TABS PO   Oral   Take 2 tablets by mouth once.         Marland Kitchen PRAZOSIN HCL 1 MG PO CAPS   Oral   Take 1 mg by  mouth at bedtime.           BP 111/71  Pulse 82  Temp 97.9 F (36.6 C)  Resp 20  SpO2 97%  LMP 03/22/2012  Physical Exam  Nursing note and vitals reviewed. Constitutional: She is oriented to person, place, and time. She appears well-developed and well-nourished. No distress.  HENT:  Head: Normocephalic and atraumatic.  Eyes: EOM are normal.  Neck: Normal range of motion.  Cardiovascular: Normal rate, regular rhythm and normal heart sounds.   Pulmonary/Chest: Effort normal and breath sounds normal.  Abdominal: Soft. She exhibits no distension.       Mild suprapubic tenderness  Musculoskeletal: Normal range of motion.  Neurological: She is alert and oriented to person, place, and time.  Skin: Skin is warm and dry.  Psychiatric: She has a normal mood and affect. Judgment normal.    ED Course  Procedures (including critical care time)  Labs Reviewed  URINALYSIS, ROUTINE W REFLEX MICROSCOPIC - Abnormal; Notable for the following:    Color, Urine AMBER (*)  BIOCHEMICALS MAY BE AFFECTED BY COLOR   APPearance TURBID (*)     Hgb urine dipstick MODERATE (*)     Bilirubin Urine SMALL (*)     Nitrite POSITIVE (*)     Leukocytes, UA  LARGE (*)     All other components within normal limits  URINE MICROSCOPIC-ADD ON - Abnormal; Notable for the following:    Squamous Epithelial / LPF MANY (*)     Bacteria, UA MANY (*)     All other components within normal limits  PREGNANCY, URINE  URINE CULTURE   No results found.   1. Urinary tract infection       MDM  2:31 AM Patient feels much better at this time.  IV Cipro given.  The patient be discharged home on antibiotics.  Primary care followup for recurrent urinary tract infections the  Lyanne Co, MD 03/31/12 [phone]

## 2012-03-30 MED ORDER — ONDANSETRON 8 MG PO TBDP: 8.0000 mg | ORAL_TABLET | Freq: Three times a day (TID) | ORAL | Status: DC | PRN

## 2012-03-30 MED ORDER — OXYCODONE-ACETAMINOPHEN 5-325 MG PO TABS: 1.0000 | ORAL_TABLET | ORAL | Status: DC | PRN

## 2012-03-30 MED ORDER — CIPROFLOXACIN HCL 500 MG PO TABS: 500.0000 mg | ORAL_TABLET | Freq: Two times a day (BID) | ORAL | Status: DC

## 2012-04-01 LAB — URINE CULTURE: Colony Count: >=100000

## 2012-04-02 NOTE — ED Notes (Signed)
+   Urine Patient treated with Cipro-Sensitive to same-chart appended per protocol MD.

## 2012-04-13 ENCOUNTER — Ambulatory Visit: Payer: Self-pay | Admitting: Family Medicine

## 2012-04-13 VITALS — BP 90/61 | HR 72 | Temp 98.4°F | Resp 16 | Ht 63.0 in | Wt 122.0 lb

## 2012-04-13 DIAGNOSIS — J111 Influenza due to unidentified influenza virus with other respiratory manifestations: Secondary | ICD-10-CM

## 2012-04-13 MED ORDER — OSELTAMIVIR PHOSPHATE 75 MG PO CAPS: 75.0000 mg | ORAL_CAPSULE | Freq: Two times a day (BID) | ORAL | Status: DC

## 2012-04-13 MED ORDER — PROMETHAZINE HCL 25 MG PO TABS: 25.0000 mg | ORAL_TABLET | Freq: Three times a day (TID) | ORAL | Status: DC | PRN

## 2012-04-13 MED ORDER — HYDROCODONE-HOMATROPINE 5-1.5 MG/5ML PO SYRP: 5.0000 mL | ORAL_SOLUTION | Freq: Three times a day (TID) | ORAL | Status: DC | PRN

## 2012-04-13 NOTE — Progress Notes (Signed)
26 yo woman who has recently been exposed to the flu and now has two days of -throat sore -nonstop cough -fever -chest burning -headache -runny nose -nauseated last night with vomiting.  Objective:  Pale HEENT:  Unremarkable Chest:  Clear Heart:  Regular, no murmur Neck:  Supple  Assessment:  Influenza  Plan:

## 2012-04-13 NOTE — Patient Instructions (Addendum)

## 2012-04-29 ENCOUNTER — Encounter (HOSPITAL_COMMUNITY): Payer: Self-pay | Admitting: *Deleted

## 2012-04-29 ENCOUNTER — Emergency Department (HOSPITAL_COMMUNITY)
Admission: EM | Admit: 2012-04-29 | Discharge: 2012-04-30 | Disposition: A | Discharge status: Home / Self Care | Payer: Self-pay | Attending: Emergency Medicine | Admitting: Emergency Medicine

## 2012-04-29 DIAGNOSIS — Z8659 Personal history of other mental and behavioral disorders: Secondary | ICD-10-CM | POA: Insufficient documentation

## 2012-04-29 DIAGNOSIS — Z9889 Other specified postprocedural states: Secondary | ICD-10-CM | POA: Insufficient documentation

## 2012-04-29 DIAGNOSIS — F329 Major depressive disorder, single episode, unspecified: Secondary | ICD-10-CM | POA: Insufficient documentation

## 2012-04-29 DIAGNOSIS — J45909 Unspecified asthma, uncomplicated: Secondary | ICD-10-CM | POA: Insufficient documentation

## 2012-04-29 DIAGNOSIS — Z87440 Personal history of urinary (tract) infections: Secondary | ICD-10-CM | POA: Insufficient documentation

## 2012-04-29 DIAGNOSIS — Z79899 Other long term (current) drug therapy: Secondary | ICD-10-CM | POA: Insufficient documentation

## 2012-04-29 DIAGNOSIS — R112 Nausea with vomiting, unspecified: Secondary | ICD-10-CM | POA: Insufficient documentation

## 2012-04-29 DIAGNOSIS — F1010 Alcohol abuse, uncomplicated: Secondary | ICD-10-CM | POA: Insufficient documentation

## 2012-04-29 DIAGNOSIS — F3289 Other specified depressive episodes: Secondary | ICD-10-CM | POA: Insufficient documentation

## 2012-04-29 DIAGNOSIS — F10929 Alcohol use, unspecified with intoxication, unspecified: Secondary | ICD-10-CM

## 2012-04-29 DIAGNOSIS — F411 Generalized anxiety disorder: Secondary | ICD-10-CM | POA: Insufficient documentation

## 2012-04-29 DIAGNOSIS — Z7982 Long term (current) use of aspirin: Secondary | ICD-10-CM | POA: Insufficient documentation

## 2012-04-29 DIAGNOSIS — F17200 Nicotine dependence, unspecified, uncomplicated: Secondary | ICD-10-CM | POA: Insufficient documentation

## 2012-04-29 MED ORDER — SODIUM CHLORIDE 0.9 % IV BOLUS (SEPSIS)
1000.0000 mL | Freq: Once | INTRAVENOUS | Status: AC
  Administered 2012-04-30: 1000 mL via INTRAVENOUS

## 2012-04-29 MED ORDER — ONDANSETRON HCL 4 MG/2ML IJ SOLN
4.0000 mg | Freq: Once | INTRAMUSCULAR | Status: AC
  Administered 2012-04-30: 4 mg via INTRAVENOUS
  Filled 2012-04-29: qty 2

## 2012-04-29 NOTE — ED Notes (Addendum)
Pt in stating she has been drinking alcohol, since that time she has been vomiting and she hasn't eaten, pt tearful in triage and states she isn't supposed to drink due to her anxiety. Pt called her friend to take her to ED because she continued to feel so sick after drinking, last drink was a few hours ago.

## 2012-04-29 NOTE — ED Notes (Signed)
MD at bedside. 

## 2012-04-29 NOTE — ED Provider Notes (Signed)
History     CSN: [id number]  Arrival date & time 04/29/12  2234   First MD Initiated Contact with Patient 04/29/12 2307      Chief Complaint  Patient presents with  . Alcohol Intoxication  . Emesis    (Consider location/radiation/quality/duration/timing/severity/associated sxs/prior treatment) HPI Hx per PT, drinking alcohol tonight, developed N/V, presents here for evaluation. Has h/o PTSD, takes xanax and is feeling very anxious, symptoms are MOD in severity. No CP or wheezing. NB/ NB emesis x multiple episodes prior to arrival. No diarrhea. No fevers. Past Medical History  Diagnosis Date  . UTI (lower urinary tract infection)   . PTSD (post-traumatic stress disorder)   . Anxiety and depression   . Abnormal Pap smear 2011  . Asthma     Past Surgical History  Procedure Date  . Thyroglossoduct cyst     x 2  . Wisdom tooth extraction     Family History  Problem Relation Age of Onset  . Depression Mother   . Anxiety disorder Mother   . Depression Father   . Hearing loss Paternal Uncle     History  Substance Use Topics  . Smoking status: Current Every Day Smoker -- 0.2 packs/day for .5 years    Types: Cigarettes  . Smokeless tobacco: Never Used  . Alcohol Use: No     Comment: rarely    OB History    Grav Para Term Preterm Abortions TAB SAB Ect Mult Living   0               Review of Systems  Constitutional: Negative for fever and chills.  HENT: Negative for neck pain and neck stiffness.   Eyes: Negative for pain.  Respiratory: Negative for cough.   Cardiovascular: Negative for chest pain.  Gastrointestinal: Positive for nausea and vomiting. Negative for abdominal pain.  Genitourinary: Negative for dysuria.  Musculoskeletal: Negative for back pain.  Skin: Negative for rash.  Neurological: Negative for headaches.  All other systems reviewed and are negative.    Allergies  Clindamycin/lincomycin; Penicillins; and Sulfa antibiotics  Home Medications     Current Outpatient Rx  Name  Route  Sig  Dispense  Refill  . ALBUTEROL SULFATE HFA 108 (90 BASE) MCG/ACT IN AERS   Inhalation   Inhale 2 puffs into the lungs every 6 (six) hours as needed. Wheezing         . ALPRAZOLAM 2 MG PO TABS   Oral   Take 2 mg by mouth 3 (three) times daily as needed. Anxiety         . ASPIRIN-ACETAMINOPHEN-CAFFEINE 250-250-65 MG PO TABS   Oral   Take 2 tablets by mouth every 6 (six) hours as needed. For pain.         Marland Kitchen CALCIUM CARBONATE ANTACID 500 MG PO CHEW   Oral   Chew 1 tablet by mouth daily as needed. For heartburn         . CITALOPRAM HYDROBROMIDE 40 MG PO TABS   Oral   Take 40 mg by mouth daily.         Marland Kitchen HYDROCODONE-HOMATROPINE 5-1.5 MG/5ML PO SYRP   Oral   Take 5 mLs by mouth every 8 (eight) hours as needed for cough.   120 mL   0   . OSELTAMIVIR PHOSPHATE 75 MG PO CAPS   Oral   Take 1 capsule (75 mg total) by mouth 2 (two) times daily.   10 capsule   0   .  PRAZOSIN HCL 1 MG PO CAPS   Oral   Take 1 mg by mouth at bedtime.         Marland Kitchen PROMETHAZINE HCL 25 MG PO TABS   Oral   Take 1 tablet (25 mg total) by mouth every 8 (eight) hours as needed for nausea.   20 tablet   0     BP 105/60  Pulse 85  Resp 20  SpO2 100%  Physical Exam  Nursing note and vitals reviewed. Constitutional: She is oriented to person, place, and time. She appears well-developed and well-nourished.  HENT:  Head: Normocephalic and atraumatic.  Eyes: EOM are normal. Pupils are equal, round, and reactive to light.  Neck: Neck supple.  Cardiovascular: Normal rate, regular rhythm, normal heart sounds and intact distal pulses.   Pulmonary/Chest: Effort normal and breath sounds normal. No respiratory distress. She has no wheezes.  Abdominal: Soft. Bowel sounds are normal. She exhibits no distension. There is no tenderness. There is no rebound.  Musculoskeletal: Normal range of motion. She exhibits no edema.  Neurological: She is alert and oriented  to person, place, and time.  Skin: Skin is warm and dry.  Psychiatric:       Anxious, cooperative    ED Course  Procedures (including critical care time)   IV fluids and Zofran provided  1:30 AM recheck is feeling much better and requesting to be discharged home. Sister bedside agreeable to plan. No indication for admit or further ED workup at this time   MDM   Alcohol intoxication with associated nausea and vomiting. Symptoms improved with fluids and medications as above. Vital signs nursing note reviewed and considered.          Sunnie Nielsen, MD 04/30/12 [phone]

## 2012-04-30 MED ORDER — ONDANSETRON HCL 4 MG PO TABS: 4.0000 mg | ORAL_TABLET | Freq: Four times a day (QID) | ORAL | Status: DC

## 2012-06-05 ENCOUNTER — Other Ambulatory Visit: Payer: Self-pay

## 2012-07-16 ENCOUNTER — Encounter (HOSPITAL_COMMUNITY): Payer: Self-pay | Admitting: Nurse Practitioner

## 2012-07-16 ENCOUNTER — Emergency Department (HOSPITAL_COMMUNITY)
Admission: EM | Admit: 2012-07-16 | Discharge: 2012-07-16 | Disposition: A | Discharge status: Home / Self Care | Payer: Self-pay | Attending: Emergency Medicine | Admitting: Emergency Medicine

## 2012-07-16 ENCOUNTER — Emergency Department (HOSPITAL_COMMUNITY): Payer: Self-pay

## 2012-07-16 DIAGNOSIS — F17200 Nicotine dependence, unspecified, uncomplicated: Secondary | ICD-10-CM | POA: Insufficient documentation

## 2012-07-16 DIAGNOSIS — F4310 Post-traumatic stress disorder, unspecified: Secondary | ICD-10-CM | POA: Insufficient documentation

## 2012-07-16 DIAGNOSIS — M62838 Other muscle spasm: Secondary | ICD-10-CM

## 2012-07-16 DIAGNOSIS — Z87440 Personal history of urinary (tract) infections: Secondary | ICD-10-CM | POA: Insufficient documentation

## 2012-07-16 DIAGNOSIS — H53149 Visual discomfort, unspecified: Secondary | ICD-10-CM | POA: Insufficient documentation

## 2012-07-16 DIAGNOSIS — R55 Syncope and collapse: Secondary | ICD-10-CM

## 2012-07-16 DIAGNOSIS — R110 Nausea: Secondary | ICD-10-CM | POA: Insufficient documentation

## 2012-07-16 DIAGNOSIS — M542 Cervicalgia: Secondary | ICD-10-CM | POA: Insufficient documentation

## 2012-07-16 DIAGNOSIS — F341 Dysthymic disorder: Secondary | ICD-10-CM | POA: Insufficient documentation

## 2012-07-16 DIAGNOSIS — J45909 Unspecified asthma, uncomplicated: Secondary | ICD-10-CM | POA: Insufficient documentation

## 2012-07-16 DIAGNOSIS — Z79899 Other long term (current) drug therapy: Secondary | ICD-10-CM | POA: Insufficient documentation

## 2012-07-16 DIAGNOSIS — R51 Headache: Secondary | ICD-10-CM | POA: Insufficient documentation

## 2012-07-16 LAB — POCT I-STAT, CHEM 8
BUN: 7 mg/dL (ref 6–23)
Calcium, Ion: 1.15 mmol/L (ref 1.12–1.23)
Chloride: 101 mEq/L (ref 96–112)
Creatinine, Ser: 1.2 mg/dL — ABNORMAL HIGH (ref 0.50–1.10)
Glucose, Bld: 83 mg/dL (ref 70–99)
HCT: 48 % — ABNORMAL HIGH (ref 36.0–46.0)
Hemoglobin: 16.3 g/dL — ABNORMAL HIGH (ref 12.0–15.0)
Potassium: 3.4 mEq/L — ABNORMAL LOW (ref 3.5–5.1)
Sodium: 140 meq/L (ref 135–145)
TCO2: 30 mmol/L (ref 0–100)

## 2012-07-16 LAB — HCG, SERUM, QUALITATIVE: Preg, Serum: NEGATIVE

## 2012-07-16 MED ORDER — KETOROLAC TROMETHAMINE 60 MG/2ML IM SOLN
60.0000 mg | Freq: Once | INTRAMUSCULAR | Status: AC
  Administered 2012-07-16: 60 mg via INTRAMUSCULAR
  Filled 2012-07-16: qty 2

## 2012-07-16 MED ORDER — CYCLOBENZAPRINE HCL 5 MG PO TABS: 5.0000 mg | ORAL_TABLET | Freq: Three times a day (TID) | ORAL | Status: DC | PRN

## 2012-07-16 MED ORDER — DIAZEPAM 5 MG PO TABS
5.0000 mg | ORAL_TABLET | Freq: Once | ORAL | Status: AC
  Administered 2012-07-16: 5 mg via ORAL
  Filled 2012-07-16: qty 1

## 2012-07-16 MED ORDER — IBUPROFEN 800 MG PO TABS: 800.0000 mg | ORAL_TABLET | Freq: Three times a day (TID) | ORAL | Status: DC
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
## 2012-07-16 NOTE — ED Notes (Signed)
Per Dr. Oletta Lamas- fluid challenge not necessary at this point.

## 2012-07-16 NOTE — ED Notes (Addendum)
Per pt: Pt had a "night terror" this morning and was awakened by a bad dream (Hx of PTSD/ Depression and anxiety ).  Pt began walking to restroom and suddenly passed out and woke up on bedroom floor (bedroom floor carpeted).  Pt woke up and felt pain in right wrists, right ribs, neck and head.  Pt believes that she hit the door and vanity.  Pt had emesis x2 about 5 minutes after she regained consciousness.  Pt denies taking any medications prior to fall.

## 2012-07-16 NOTE — ED Notes (Signed)
MD at bedside. 

## 2012-08-23 ENCOUNTER — Encounter: Payer: Self-pay | Admitting: Advanced Practice Midwife

## 2012-08-23 ENCOUNTER — Ambulatory Visit (INDEPENDENT_AMBULATORY_CARE_PROVIDER_SITE_OTHER): Payer: Self-pay | Admitting: Advanced Practice Midwife

## 2012-08-23 VITALS — BP 121/78 | HR 77 | Temp 97.5°F | Ht 63.0 in | Wt 116.6 lb

## 2012-08-23 DIAGNOSIS — R300 Dysuria: Secondary | ICD-10-CM

## 2012-08-23 DIAGNOSIS — N926 Irregular menstruation, unspecified: Secondary | ICD-10-CM

## 2012-08-23 DIAGNOSIS — N970 Female infertility associated with anovulation: Secondary | ICD-10-CM | POA: Insufficient documentation

## 2012-08-23 LAB — POCT URINALYSIS DIP (DEVICE)
Bilirubin Urine: NEGATIVE
Ketones, ur: NEGATIVE mg/dL
Leukocytes, UA: NEGATIVE
Protein, ur: NEGATIVE mg/dL
Specific Gravity, Urine: 1.025 (ref 1.005–1.030)
pH: 5.5 (ref 5.0–8.0)

## 2012-08-23 LAB — POCT PREGNANCY, URINE: Preg Test, Ur: NEGATIVE

## 2012-08-23 MED ORDER — AMITRIPTYLINE HCL 25 MG PO TABS: 25.0000 mg | ORAL_TABLET | Freq: Every day | ORAL | Status: DC

## 2012-08-23 NOTE — Patient Instructions (Addendum)
Dysfunctional Uterine Bleeding  Normally, menstrual periods begin between ages 11 to 17 in young women. A normal menstrual cycle/period may begin every 23 days up to 35 days and lasts from 1 to 7 days. Around 12 to 14 days before your menstrual period starts, ovulation (ovary produces an egg) occurs. When counting the time between menstrual periods, count from the first day of bleeding of the previous period to the first day of bleeding of the next period.  Dysfunctional (abnormal) uterine bleeding is bleeding that is different from a normal menstrual period. Your periods may come earlier or later than usual. They may be lighter, have blood clots or be heavier. You may have bleeding between periods, or you may skip one period or more. You may have bleeding after sexual intercourse, bleeding after menopause, or no menstrual period.  CAUSES   · Pregnancy (normal, miscarriage, tubal).  · IUDs (intrauterine device, birth control).  · Birth control pills.  · Hormone treatment.  · Menopause.  · Infection of the cervix.  · Blood clotting problems.  · Infection of the inside lining of the uterus.  · Endometriosis, inside lining of the uterus growing in the pelvis and other female organs.  · Adhesions (scar tissue) inside the uterus.  · Obesity or severe weight loss.  · Uterine polyps inside the uterus.  · Cancer of the vagina, cervix, or uterus.  · Ovarian cysts or polycystic ovary syndrome.  · Medical problems (diabetes, thyroid disease).  · Uterine fibroids (noncancerous tumor).  · Problems with your female hormones.  · Endometrial hyperplasia, very thick lining and enlarged cells inside of the uterus.  · Medicines that interfere with ovulation.  · Radiation to the pelvis or abdomen.  · Chemotherapy.  DIAGNOSIS   · Your doctor will discuss the history of your menstrual periods, medicines you are taking, changes in your weight, stress in your life, and any medical problems you may have.  · Your doctor will do a physical  and pelvic examination.  · Your doctor may want to perform certain tests to make a diagnosis, such as:  · Pap test.  · Blood tests.  · Cultures for infection.  · CT scan.  · Ultrasound.  · Hysteroscopy.  · Laparoscopy.  · MRI.  · Hysterosalpingography.  · D and C.  · Endometrial biopsy.  TREATMENT   Treatment will depend on the cause of the dysfunctional uterine bleeding (DUB). Treatment may include:  · Observing your menstrual periods for a couple of months.  · Prescribing medicines for medical problems, including:  · Antibiotics.  · Hormones.  · Birth control pills.  · Removing an IUD (intrauterine device, birth control).  · Surgery:  · D and C (scrape and remove tissue from inside the uterus).  · Laparoscopy (examine inside the abdomen with a lighted tube).  · Uterine ablation (destroy lining of the uterus with electrical current, laser, heat, or freezing).  · Hysteroscopy (examine cervix and uterus with a lighted tube).  · Hysterectomy (remove the uterus).  HOME CARE INSTRUCTIONS   · If medicines were prescribed, take exactly as directed. Do not change or switch medicines without consulting your caregiver.  · Long term heavy bleeding may result in iron deficiency. Your caregiver may have prescribed iron pills. They help replace the iron that your body lost from heavy bleeding. Take exactly as directed.  · Do not take aspirin or medicines that contain aspirin one week before or during your menstrual period. Aspirin may make   the bleeding worse.  · If you need to change your sanitary pad or tampon more than once every 2 hours, stay in bed with your feet elevated and a cold pack on your lower abdomen. Rest as much as possible, until the bleeding stops or slows down.  · Eat well-balanced meals. Eat foods high in iron. Examples are:  · Leafy green vegetables.  · Whole-grain breads and cereals.  · Eggs.  · Meat.  · Liver.  · Do not try to lose weight until the abnormal bleeding has stopped and your blood iron level is  back to normal. Do not lift more than ten pounds or do strenuous activities when you are bleeding.  · For a couple of months, make note on your calendar, marking the start and ending of your period, and the type of bleeding (light, medium, heavy, spotting, clots or missed periods). This is for your caregiver to better evaluate your problem.  SEEK MEDICAL CARE IF:   · You develop nausea (feeling sick to your stomach) and vomiting, dizziness, or diarrhea while you are taking your medicine.  · You are getting lightheaded or weak.  · You have any problems that may be related to the medicine you are taking.  · You develop pain with your DUB.  · You want to remove your IUD.  · You want to stop or change your birth control pills or hormones.  · You have any type of abnormal bleeding mentioned above.  · You are over 16 years old and have not had a menstrual period yet.  · You are 27 years old and you are still having menstrual periods.  · You have any of the symptoms mentioned above.  · You develop a rash.  SEEK IMMEDIATE MEDICAL CARE IF:   · An oral temperature above 102° F (38.9° C) develops.  · You develop chills.  · You are changing your sanitary pad or tampon more than once an hour.  · You develop abdominal pain.  · You pass out or faint.  Document Released: 04/04/2000 Document Revised: 06/30/2011 Document Reviewed: 03/06/2009  [employer]® Patient Information ©2013 [employer].

## 2012-08-23 NOTE — Progress Notes (Signed)
  Subjective:    Patient ID: Pamela Gallagher, female    DOB: 11-16-1985, 27 y.o.   MRN: [id number]  HPI This is a 27 y.o. female who presents with two concerns. First, she did not have a normal period this month. Instead of being 6-7 days and heavy it has been 11 days of very light bleeding. No cramps as usual. No PMS as usual. Has never missed a cycle before. Second, she has a long history of frequent UTIs and is still having some dysuria and some difficulty with initiating urine stream and then some bladder pain. Uses Macrobid suppression daily and Pyridium PRN. Wants another med that the doctor mentioned last time, for possible interstitial cystitis.    Review of Systems  Constitutional: Negative for fever, chills, diaphoresis and activity change.  Endocrine: Negative for cold intolerance and heat intolerance.  Genitourinary: Positive for dysuria, vaginal bleeding, difficulty urinating and menstrual problem. Negative for frequency, hematuria and flank pain.  Neurological: Negative for dizziness.       Objective:   Physical Exam  Constitutional: She is oriented to person, place, and time. She appears well-developed and well-nourished. No distress.  HENT:  Head: Normocephalic.  Cardiovascular: Normal rate.   Pulmonary/Chest: Effort normal.  Abdominal: Soft. There is no tenderness.  Musculoskeletal: Normal range of motion.  Neurological: She is alert and oriented to person, place, and time.  Skin: Skin is warm and dry.  Psychiatric: She has a normal mood and affect.    UA totally negative      Assessment & Plan:  A:  History of chronic UTIs      Possible bladder spasm vs possible Interstitial cystitis      Probable anovulatory cycle   P:  Recommend Urology eval, but pt has no insurance and wants to try med first       Discussed with Dr Erin Fulling and Dr Shawnie Pons       Will try small dose of Elavil 25mg  qhs       Advised stopping caffeine and sodas       Advised urology  consult but pt does not have insurance       Advised menstrual problem will probably resolve over next month or so

## 2012-10-05 ENCOUNTER — Inpatient Hospital Stay (HOSPITAL_COMMUNITY)
Admission: RE | Admit: 2012-10-05 | Discharge: 2012-10-08 | DRG: 897 | Disposition: A | Discharge status: Home / Self Care | Payer: Federal, State, Local not specified - Other | Attending: Psychiatry | Admitting: Psychiatry

## 2012-10-05 ENCOUNTER — Encounter (HOSPITAL_COMMUNITY): Payer: Self-pay

## 2012-10-05 ENCOUNTER — Encounter (HOSPITAL_COMMUNITY): Payer: Self-pay | Admitting: *Deleted

## 2012-10-05 ENCOUNTER — Emergency Department (HOSPITAL_COMMUNITY)
Admission: EM | Admit: 2012-10-05 | Discharge: 2012-10-05 | Disposition: A | Discharge status: Other Acute Inpatient Hospital | Payer: Self-pay | Attending: Emergency Medicine | Admitting: Emergency Medicine

## 2012-10-05 DIAGNOSIS — Z79899 Other long term (current) drug therapy: Secondary | ICD-10-CM | POA: Insufficient documentation

## 2012-10-05 DIAGNOSIS — F32A Depression, unspecified: Secondary | ICD-10-CM

## 2012-10-05 DIAGNOSIS — G4700 Insomnia, unspecified: Secondary | ICD-10-CM | POA: Insufficient documentation

## 2012-10-05 DIAGNOSIS — R450 Nervousness: Secondary | ICD-10-CM | POA: Insufficient documentation

## 2012-10-05 DIAGNOSIS — M542 Cervicalgia: Secondary | ICD-10-CM | POA: Insufficient documentation

## 2012-10-05 DIAGNOSIS — G8929 Other chronic pain: Secondary | ICD-10-CM | POA: Insufficient documentation

## 2012-10-05 DIAGNOSIS — N949 Unspecified condition associated with female genital organs and menstrual cycle: Secondary | ICD-10-CM | POA: Insufficient documentation

## 2012-10-05 DIAGNOSIS — F411 Generalized anxiety disorder: Secondary | ICD-10-CM | POA: Diagnosis present

## 2012-10-05 DIAGNOSIS — F341 Dysthymic disorder: Secondary | ICD-10-CM | POA: Insufficient documentation

## 2012-10-05 DIAGNOSIS — F329 Major depressive disorder, single episode, unspecified: Secondary | ICD-10-CM

## 2012-10-05 DIAGNOSIS — Z87440 Personal history of urinary (tract) infections: Secondary | ICD-10-CM | POA: Insufficient documentation

## 2012-10-05 DIAGNOSIS — Z880 Allergy status to penicillin: Secondary | ICD-10-CM | POA: Insufficient documentation

## 2012-10-05 DIAGNOSIS — F1320 Sedative, hypnotic or anxiolytic dependence, uncomplicated: Principal | ICD-10-CM

## 2012-10-05 DIAGNOSIS — F4310 Post-traumatic stress disorder, unspecified: Secondary | ICD-10-CM | POA: Insufficient documentation

## 2012-10-05 DIAGNOSIS — Z8659 Personal history of other mental and behavioral disorders: Secondary | ICD-10-CM | POA: Insufficient documentation

## 2012-10-05 DIAGNOSIS — J45909 Unspecified asthma, uncomplicated: Secondary | ICD-10-CM | POA: Insufficient documentation

## 2012-10-05 DIAGNOSIS — F1210 Cannabis abuse, uncomplicated: Secondary | ICD-10-CM

## 2012-10-05 DIAGNOSIS — F17200 Nicotine dependence, unspecified, uncomplicated: Secondary | ICD-10-CM | POA: Insufficient documentation

## 2012-10-05 DIAGNOSIS — F331 Major depressive disorder, recurrent, moderate: Secondary | ICD-10-CM

## 2012-10-05 DIAGNOSIS — Z3202 Encounter for pregnancy test, result negative: Secondary | ICD-10-CM | POA: Insufficient documentation

## 2012-10-05 LAB — COMPREHENSIVE METABOLIC PANEL
ALT: 7 U/L (ref 0–35)
AST: 14 U/L (ref 0–37)
Alkaline Phosphatase: 82 U/L (ref 39–117)
CO2: 28 mEq/L (ref 19–32)
Chloride: 99 mEq/L (ref 96–112)
GFR calc Af Amer: >90 mL/min (ref >90)
GFR calc non Af Amer: >90 mL/min (ref >90)
Glucose, Bld: 91 mg/dL (ref 70–99)
Sodium: 138 mEq/L (ref 135–145)
Total Bilirubin: 0.5 mg/dL (ref 0.3–1.2)

## 2012-10-05 LAB — CBC
Hemoglobin: 15.5 g/dL — ABNORMAL HIGH (ref 12.0–15.0)
Platelets: 261 10*3/uL (ref 150–400)
RBC: 4.94 MIL/uL (ref 3.87–5.11)
WBC: 7.9 10*3/uL (ref 4.0–10.5)

## 2012-10-05 LAB — POCT PREGNANCY, URINE: Preg Test, Ur: NEGATIVE

## 2012-10-05 LAB — RAPID URINE DRUG SCREEN, HOSP PERFORMED
Amphetamines: NOT DETECTED
Barbiturates: NOT DETECTED
Tetrahydrocannabinol: POSITIVE — AB

## 2012-10-05 MED ORDER — PRAZOSIN HCL 1 MG PO CAPS
1.0000 mg | ORAL_CAPSULE | Freq: Every day | ORAL | Status: DC
  Filled 2012-10-05: qty 1

## 2012-10-05 MED ORDER — GABAPENTIN 300 MG PO CAPS
300.0000 mg | ORAL_CAPSULE | Freq: Three times a day (TID) | ORAL | Status: DC
  Administered 2012-10-05: 300 mg via ORAL
  Filled 2012-10-05 (×2): qty 1

## 2012-10-05 MED ORDER — ALBUTEROL SULFATE HFA 108 (90 BASE) MCG/ACT IN AERS: 2.0000 | INHALATION_SPRAY | Freq: Four times a day (QID) | RESPIRATORY_TRACT | Status: DC | PRN

## 2012-10-05 MED ORDER — GABAPENTIN 300 MG PO CAPS
300.0000 mg | ORAL_CAPSULE | Freq: Three times a day (TID) | ORAL | Status: DC
  Administered 2012-10-06 – 2012-10-08 (×8): 300 mg via ORAL
  Filled 2012-10-05: qty 42
  Filled 2012-10-05 (×3): qty 1
  Filled 2012-10-05: qty 42
  Filled 2012-10-05 (×3): qty 1
  Filled 2012-10-05: qty 42
  Filled 2012-10-05 (×5): qty 1

## 2012-10-05 MED ORDER — ALUM & MAG HYDROXIDE-SIMETH 200-200-20 MG/5ML PO SUSP: 30.0000 mL | ORAL | Status: DC | PRN

## 2012-10-05 MED ORDER — CITALOPRAM HYDROBROMIDE 40 MG PO TABS
40.0000 mg | ORAL_TABLET | Freq: Every day | ORAL | Status: DC
  Filled 2012-10-05: qty 1

## 2012-10-05 MED ORDER — NICOTINE 14 MG/24HR TD PT24
14.0000 mg | MEDICATED_PATCH | Freq: Every day | TRANSDERMAL | Status: DC
  Administered 2012-10-06 – 2012-10-08 (×3): 14 mg via TRANSDERMAL
  Filled 2012-10-05 (×6): qty 1

## 2012-10-05 MED ORDER — MAGNESIUM HYDROXIDE 400 MG/5ML PO SUSP: 30.0000 mL | Freq: Every day | ORAL | Status: DC | PRN

## 2012-10-05 MED ORDER — POTASSIUM CHLORIDE CRYS ER 20 MEQ PO TBCR
20.0000 meq | EXTENDED_RELEASE_TABLET | Freq: Two times a day (BID) | ORAL | Status: AC
  Administered 2012-10-05 – 2012-10-07 (×4): 20 meq via ORAL
  Filled 2012-10-05 (×6): qty 1

## 2012-10-05 MED ORDER — ALPRAZOLAM 0.5 MG PO TABS: 2.0000 mg | ORAL_TABLET | Freq: Three times a day (TID) | ORAL | Status: DC | PRN

## 2012-10-05 MED ORDER — CYCLOBENZAPRINE HCL 10 MG PO TABS: 5.0000 mg | ORAL_TABLET | Freq: Three times a day (TID) | ORAL | Status: DC | PRN

## 2012-10-05 MED ORDER — PRAZOSIN HCL 1 MG PO CAPS
1.0000 mg | ORAL_CAPSULE | Freq: Every day | ORAL | Status: DC
Start: 2012-10-05 — End: 2012-10-08
  Administered 2012-10-05 – 2012-10-07 (×3): 1 mg via ORAL
  Filled 2012-10-05 (×4): qty 1
  Filled 2012-10-05: qty 14
  Filled 2012-10-05: qty 1

## 2012-10-05 MED ORDER — CITALOPRAM HYDROBROMIDE 40 MG PO TABS: 40.0000 mg | ORAL_TABLET | Freq: Every day | ORAL | Status: DC

## 2012-10-05 MED ORDER — ACETAMINOPHEN 325 MG PO TABS: 650.0000 mg | ORAL_TABLET | Freq: Four times a day (QID) | ORAL | Status: DC | PRN

## 2012-10-05 MED ORDER — NAPROXEN 500 MG PO TABS
500.0000 mg | ORAL_TABLET | Freq: Two times a day (BID) | ORAL | Status: DC | PRN
  Administered 2012-10-05: 500 mg via ORAL
  Filled 2012-10-05: qty 1

## 2012-10-05 MED ORDER — AMITRIPTYLINE HCL 25 MG PO TABS
25.0000 mg | ORAL_TABLET | Freq: Every day | ORAL | Status: DC
  Administered 2012-10-05 – 2012-10-07 (×3): 25 mg via ORAL
  Filled 2012-10-05 (×3): qty 1
  Filled 2012-10-05: qty 14
  Filled 2012-10-05 (×2): qty 1

## 2012-10-05 MED ORDER — AMITRIPTYLINE HCL 25 MG PO TABS: 25.0000 mg | ORAL_TABLET | Freq: Every day | ORAL | Status: DC

## 2012-10-05 MED ORDER — CHLORDIAZEPOXIDE HCL 25 MG PO CAPS: 25.0000 mg | ORAL_CAPSULE | Freq: Four times a day (QID) | ORAL | Status: DC | PRN

## 2012-10-05 NOTE — Tx Team (Signed)
Initial Interdisciplinary Treatment Plan  PATIENT STRENGTHS: (choose at least two) Ability for insight Capable of independent living Communication skills General fund of knowledge Motivation for treatment/growth Work skills  PATIENT STRESSORS: Financial difficulties Medication change or noncompliance Occupational concerns Substance abuse   PROBLEM LIST: Problem List/Patient Goals Date to be addressed Date deferred Reason deferred Estimated date of resolution  Substance abuse benzo/THC 10/05/12   D/C        Depression/anxiety 10/05/2012   D/C        Housing concerns/recent loss of apartment 10/05/2012   D/C                           DISCHARGE CRITERIA:  Ability to meet basic life and health needs Improved stabilization in mood, thinking, and/or behavior Reduction of life-threatening or endangering symptoms to within safe limits Safe-care adequate arrangements made Verbal commitment to aftercare and medication compliance Withdrawal symptoms are absent or subacute and managed without 24-hour nursing intervention  PRELIMINARY DISCHARGE PLAN: Attend aftercare/continuing care group Attend 12-step recovery group Placement in alternative living arrangements  PATIENT/FAMIILY INVOLVEMENT: This treatment plan has been presented to and reviewed with the patient, Pamela Gallagher, and/or family member, .  The patient and family have been given the opportunity to ask questions and make suggestions.  Cresenciano Lick 10/05/2012, 6:45 PM
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
## 2012-10-05 NOTE — Progress Notes (Signed)
Nursing admit note- Patient is a 27y/o s/w/f admitted voluntarily following medical clearance from [facility]. Presents requesting "help with medications" which she states are for chronic pelvic pain and anxiety.  Was guarded and vague when asked about medications,dosages and providers.  Cooperative but distraught and did confirm that she wanted to "be off medications."  Housing stressors r/t landlord evicting her and currently homeless.  Sleep issues r/t to "night terrors". Extensive PTA list. Denies SI/HI. Confirmed THC use but denied etoh or other illicits.  States she is on a "downward spiral".  Orientation to unit and meal given.  POC and 15' checks initiated.  Remains safe on the unit.

## 2012-10-05 NOTE — Progress Notes (Signed)
C/O pain at skin piercing sites.  Jewelry returned upon request.
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
## 2012-10-05 NOTE — ED Provider Notes (Addendum)
History     CSN: [id number]  Arrival date & time 10/05/12  1006   First MD Initiated Contact with Patient 10/05/12 1036      Chief Complaint  Patient presents with  . Medical Clearance    (Consider location/radiation/quality/duration/timing/severity/associated sxs/prior treatment) HPI Comments: Patient presents to the ER for evaluation of increased anxiety and depression. Patient reports a severe history of insomnia, worsening. She has run out of some of her sleep agents, has been using increased amounts of her other medications to compensate. Patient not actively suicidal but is concerned about her own safety. Patient does have a previous suicide attempt. Patient reports multiple new social stressors in her left this week, causing her decline.  She reports a history of chronic pelvic pain. She is having pain today because she is out of her pain medications. The pain is no different than her chronic pain. She also complains of pain in the left neck. She says that she pulled a muscle the other day and has been having pain ever since.   Past Medical History  Diagnosis Date  . UTI (lower urinary tract infection)   . PTSD (post-traumatic stress disorder)     "adultified child"  . Anxiety and depression   . Abnormal Pap smear 2011  . Asthma     Past Surgical History  Procedure Laterality Date  . Thyroglossoduct cyst      x 2  . Wisdom tooth extraction      Family History  Problem Relation Age of Onset  . Depression Mother   . Anxiety disorder Mother   . Depression Father   . Hearing loss Paternal Uncle     History  Substance Use Topics  . Smoking status: Current Every Day Smoker -- 0.50 packs/day for .5 years    Types: Cigarettes  . Smokeless tobacco: Never Used  . Alcohol Use: Yes     Comment: social    OB History   Grav Para Term Preterm Abortions TAB SAB Ect Mult Living   0               Review of Systems  HENT: Positive for neck pain.   Genitourinary:  Positive for pelvic pain.  Psychiatric/Behavioral: Positive for dysphoric mood. The patient is nervous/anxious.   All other systems reviewed and are negative.    Allergies  Clindamycin/lincomycin; Penicillins; and Sulfa antibiotics  Home Medications   Current Outpatient Rx  Name  Route  Sig  Dispense  Refill  . albuterol (PROVENTIL HFA;VENTOLIN HFA) 108 (90 BASE) MCG/ACT inhaler   Inhalation   Inhale 2 puffs into the lungs every 6 (six) hours as needed. Wheezing         . alprazolam (XANAX) 2 MG tablet   Oral   Take 2 mg by mouth 3 (three) times daily as needed. Anxiety         . amitriptyline (ELAVIL) 25 MG tablet   Oral   Take 1 tablet (25 mg total) by mouth at bedtime.   30 tablet   0   . citalopram (CELEXA) 40 MG tablet   Oral   Take 40 mg by mouth daily at 12 noon.          . cyclobenzaprine (FLEXERIL) 5 MG tablet   Oral   Take 1 tablet (5 mg total) by mouth 3 (three) times daily as needed for muscle spasms.   20 tablet   0   . gabapentin (NEURONTIN) 300 MG capsule   Oral  Take 300 mg by mouth 3 (three) times daily.         . prazosin (MINIPRESS) 1 MG capsule   Oral   Take 1 mg by mouth at bedtime.           BP 112/79  Pulse 97  Temp(Src) 98.3 F (36.8 C) (Oral)  Resp 18  SpO2 98%  LMP 09/29/2012  Physical Exam  Constitutional: She is oriented to person, place, and time. She appears well-developed and well-nourished. No distress.  HENT:  Head: Normocephalic and atraumatic.  Right Ear: Hearing normal.  Left Ear: Hearing normal.  Nose: Nose normal.  Mouth/Throat: Oropharynx is clear and moist and mucous membranes are normal.  Eyes: Conjunctivae and EOM are normal. Pupils are equal, round, and reactive to light.  Neck: Normal range of motion. Neck supple. Muscular tenderness present. No spinous process tenderness present. Normal range of motion present.  Cardiovascular: Regular rhythm, S1 normal and S2 normal.  Exam reveals no gallop and  no friction rub.   No murmur heard. Pulmonary/Chest: Effort normal and breath sounds normal. No respiratory distress. She exhibits no tenderness.  Abdominal: Soft. Normal appearance and bowel sounds are normal. There is no hepatosplenomegaly. There is no tenderness. There is no rebound, no guarding, no tenderness at McBurney's point and negative Murphy's sign. No hernia.  Musculoskeletal: Normal range of motion.  Neurological: She is oriented to person, place, and time. She has normal strength. No cranial nerve deficit or sensory deficit. Coordination normal. GCS eye subscore is 4. GCS verbal subscore is 5. GCS motor subscore is 6.  Appears slightly sedated, but is oriented  Skin: Skin is warm, dry and intact. No rash noted. No cyanosis.  Psychiatric: She has a normal mood and affect. Her speech is normal and behavior is normal. Thought content normal.    ED Course  Procedures (including critical care time)  Labs Reviewed  ACETAMINOPHEN LEVEL  CBC  COMPREHENSIVE METABOLIC PANEL  ETHANOL  SALICYLATE LEVEL  URINE RAPID DRUG SCREEN (HOSP PERFORMED)   No results found.   Diagnosis: Depression    MDM  Patient has been [facility] to [facility] for evaluation and treatment of her increasing anxiety and depression. She has been overusing her medications, however, and was therefore sent to ER for medical clearance. Blood work performed here in the ER. Patient complaining of pelvic pain but this is chronic, no change. She also has some mild left sided neck pain after a strain type injury, no midline pain. No imaging necessary.  Workup has returned with any abnormality. The patient is medically clear for psychiatric treatment.         Gilda Crease, MD 10/05/12 1201  Gilda Crease, MD 10/05/12 [phone]

## 2012-10-06 DIAGNOSIS — F1320 Sedative, hypnotic or anxiolytic dependence, uncomplicated: Principal | ICD-10-CM

## 2012-10-06 DIAGNOSIS — F411 Generalized anxiety disorder: Secondary | ICD-10-CM

## 2012-10-06 MED ORDER — FLUOXETINE HCL 20 MG PO CAPS
20.0000 mg | ORAL_CAPSULE | Freq: Every day | ORAL | Status: DC
  Administered 2012-10-06 – 2012-10-08 (×3): 20 mg via ORAL
  Filled 2012-10-06 (×2): qty 1
  Filled 2012-10-06: qty 14
  Filled 2012-10-06 (×2): qty 1

## 2012-10-06 MED ORDER — ADULT MULTIVITAMIN W/MINERALS CH
1.0000 | ORAL_TABLET | Freq: Every day | ORAL | Status: DC
  Administered 2012-10-06 – 2012-10-08 (×3): 1 via ORAL
  Filled 2012-10-06 (×5): qty 1

## 2012-10-06 NOTE — Progress Notes (Signed)
Adult Psychoeducational Group Note  Date:  10/06/2012 Time:  8:00PM Group Topic/Focus:  Wrap-Up Group:   The focus of this group is to help patients review their daily goal of treatment and discuss progress on daily workbooks.  Participation Level:  Did Not Attend    Additional Comments:  Pt. Didn't attend group.   Bing Plume D 10/06/2012, 8:51 PM

## 2012-10-06 NOTE — [facility] Suicide Risk Assessment (Signed)
Suicide Risk Assessment  Admission Assessment     Nursing information obtained from:  Patient Demographic factors:  Adolescent or young adult;Caucasian;Low socioeconomic status Current Mental Status:    Loss Factors:  Decline in physical health;Financial problems / change in socioeconomic status Historical Factors:    Risk Reduction Factors:  Positive social support;Positive therapeutic relationship  CLINICAL FACTORS:   Depression:   Severe Alcohol/Substance Abuse/Dependencies Chronic Pain  COGNITIVE FEATURES THAT CONTRIBUTE TO RISK:  Closed-mindedness Thought constriction (tunnel vision)    SUICIDE RISK:   Mild:  Suicidal ideation of limited frequency, intensity, duration, and specificity.  There are no identifiable plans, no associated intent, mild dysphoria and related symptoms, good self-control (both objective and subjective assessment), few other risk factors, and identifiable protective factors, including available and accessible social support.   Axis I: Benzodiazepine Dependency, Marijuana Abuse, Major Depressive Disorder, Post traumatic Stress Disorder, rule out Premenstrual Menstrual Dysphoric Disorder. Axis II: No Diagnosis Axis III:  Past Medical History   Diagnosis  Date   .  UTI (lower urinary tract infection)    .  PTSD (post-traumatic stress disorder)      "adultified child"   .  Anxiety and depression    .  Abnormal Pap smear  2011   .  Asthma    Axis IV: economic problems, occupational problems, other psychosocial or environmental problems and problems with access to health care services  Axis V: 31-40 impairment in reality testing  PLAN OF CARE:  Plan of Care:  1. Affirm with the patient that the medications are taken as ordered. Patient  expressed understanding of how their medications were to be used.    Laboratory:  Reviewed-positive for marijuana and benzodiazepine  Psychotherapy: Therapy: brief supportive therapy provided. Continue current  services.   Medications:  a) Stop Citalopram-has been on this for one year-not effective B) Start Fluoxetine 20 mg c) Continue Prazosin 1 mg d) Continue detox protocol e) Will not restart Xanax.  Routine PRN Medications:  Negative  Consultations: none  Safety Concerns:   15 minute checks  Other:   8. Referral to outpatient treatment. [facility]. [facility] 10. The patient expressed understanding of the plan and agrees with the above.      I certify that inpatient services furnished can reasonably be expected to improve the patient's condition.  Krysta Bloomfield 10/06/2012, 9:36 AM
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
## 2012-10-06 NOTE — Progress Notes (Signed)
NUTRITION ASSESSMENT  Pt identified as at risk on the Malnutrition Screen Tool  INTERVENTION: 1. Educated patient on the importance of nutrition and encouraged intake of food and beverages. 2. Discussed weight goals. 3. Supplements: MVI daily  NUTRITION DIAGNOSIS: Unintentional weight loss related to sub-optimal intake as evidenced by pt report.   Goal: Pt to meet >/= 90% of their estimated nutrition needs.  Monitor:  PO intake  Assessment:  Patient admitted with Benzodiazepine dependence and generalized anxiety disorder.  Spoke with patient who reports a poor appetite for months.  States that she had wisdom teeth out 07/21/11 and decreased eating started at that time.  Prior to admit would go 3 days without eating.  States this is often due to lack of resources.  Has 3 dogs as well which patient states she prioritizes above her own needs.  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  UBW fluctuates between 115 and 125 lbs.  Now eating well but feels bloated.  27 y.o. female  Height: Ht Readings from Last 1 Encounters:  10/05/12 5\' 3"  (1.6 m)    Weight: Wt Readings from Last 1 Encounters:  10/05/12 116 lb (52.617 kg)    Weight Hx: Wt Readings from Last 10 Encounters:  10/05/12 116 lb (52.617 kg)  08/23/12 116 lb 9.6 oz (52.889 kg)  04/13/12 122 lb (55.339 kg)  02/11/12 121 lb (54.885 kg)  02/10/12 118 lb 12.8 oz (53.887 kg)  01/14/12 119 lb 8 oz (54.205 kg)    BMI:  Body mass index is 20.55 kg/(m^2). Pt meets criteria for wnl based on current BMI.  Estimated Nutritional Needs: Kcal: 25-30 kcal/kg Protein: > 1 gram protein/kg Fluid: 1 ml/kcal  Diet Order: General Pt is also offered choice of unit snacks mid-morning and mid-afternoon.  Pt is eating as desired.   Lab results and medications reviewed.   Oran Rein, RD, LDN Clinical Inpatient Dietitian Pager:  [phone] Weekend and after hours pager:  [phone]
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
## 2012-10-06 NOTE — [facility] Group Notes (Signed)
[facility] LCSW Aftercare Discharge Planning Group Note   10/06/2012 9:44 AM  Participation Quality: Appropriate   Mood/Affect:  Appropriate  Depression Rating:  5  Anxiety Rating:  5  Thoughts of Suicide:  No Will you contract for safety?   NA  Current AVH:  No  Plan for Discharge/Comments:  Pt plans to return to cosmetology school and would like therapy and psychiatrist. She lives with her roommate and states that she is anxious to return to school. Pt states she is overmedicating due to running out of her meds.    Transportation Means: family/friends  Supports: family  Smart, Research scientist (physical sciences)

## 2012-10-06 NOTE — Progress Notes (Signed)
Patient ID: Pamela Gallagher, female   DOB: [birth year], 27 y.o.   MRN: [id number] She has been up and to groups interacting with peers and staff. She has refused her librium today saying she did not need it that she did not have any withdraw symptoms. She was  Educated as to why the medication was ordered. She denies thoughts of SI.

## 2012-10-07 DIAGNOSIS — F1320 Sedative, hypnotic or anxiolytic dependence, uncomplicated: Secondary | ICD-10-CM

## 2012-10-07 NOTE — Progress Notes (Signed)
Adult Psychoeducational Group Note  Date:  10/07/2012 Time:  12:46 PM  Group Topic/Focus:  Overcoming Stress:   The focus of this group is to define stress and help patients assess their triggers.  Participation Level:  Active  Participation Quality:  Appropriate  Affect:  Appropriate  Cognitive:  Appropriate  Insight: Appropriate  Engagement in Group:  Engaged  Modes of Intervention:  Discussion, Education and Support  Additional Comments:  Jannae was active in group and supported the other group members. Minie's comments were helpful and insightful.   Nichola Sizer 10/07/2012, 12:46 PM

## 2012-10-07 NOTE — Progress Notes (Signed)
D: Pt was tearful at the beginning of the shift after seeing her visitors. It reminded her of how much she wanted to be at home. She was also tearful about the anticipation of possibly seeing her mom. She states that her mom is one of the reasons she is here. Per pt's admission assessment note this is true. A: Writer encouraged pt to avoid focusing on the actual amount time she would actually be here. Afterwards we changed the focus to her detox progress. Pt is currently denying any withdrawal symptoms. Later in the course of this evening this pt was observed with increased interaction with the other patients. She took her medications as prescribed with no PRNs requested or needed. Continued support and availability as needed was extended to this pt. Staff continue to monitor this pt with q36min checks. R: Pt remains safe at this time.

## 2012-10-07 NOTE — [facility] Suicide Risk Assessment (Signed)
[facility] INPATIENT: Family/Significant Other Suicide Prevention Education  Suicide Prevention Education:  Education Completed; No one has been identified by the patient as the family member/significant other with whom the patient will be residing, and identified as the person(s) who will aid the patient in the event of a mental health crisis (suicidal ideations/suicide attempt). With written consent from the patient, the family member/significant other has been provided the following suicide prevention education, prior to the and/or following the discharge of the patient.  The suicide prevention education provided includes the following:  Suicide risk factors  Suicide prevention and interventions  National Suicide Hotline telephone number  [facility] assessment telephone number  [facility] Emergency Assistance 911  [facility] and/or Residential Mobile Crisis Unit telephone number Request made of family/significant other to:  Remove weapons (e.g., guns, rifles, knives), all items previously/currently identified as safety concern.  Remove drugs/medications (over-the-counter, prescriptions, illicit drugs), all items previously/currently identified as a safety concern. The family member/significant other verbalizes understanding of the suicide prevention education information provided. The family member/significant other agrees to remove the items of safety concern listed above.   Pt did not c/o SI at admission, nor have they endorsed SI during their stay here. SPE not required.  Leota Maka Smart LCSWA 10/07/2012 11:35 AM

## 2012-10-07 NOTE — [facility] Group Notes (Signed)
[facility] LCSW Group Therapy  10/07/2012 3:20 PM  Type of Therapy:  Group Therapy  Participation Level:  Active  Participation Quality:  Attentive  Affect:  Appropriate  Cognitive:  Appropriate  Insight:  Engaged  Engagement in Therapy:  Engaged  Modes of Intervention:  Discussion, Education, Exploration, Socialization and Support  Summary of Progress/Problems:  Finding Balance in Life. Today's group focused on defining balance in one's own words, identifying things that can knock one off balance, and exploring healthy ways to maintain balance in life. Group members were asked to provide an example of a time when they felt off balance, describe how they handled that situation,and process healthier ways to regain balance in the future. Group members were asked to share the most important tool for maintaining balance that they learned while at [facility] and how they plan to apply this method after discharge. [name] stated that to her, balance meant prioritizing goals, making a schedule, and stability. She stated that family members often stress her out and throw her off balance mentally. [name] stated that anxiety and depression (MI) cause her to feel out of balance. [name] processed ways to build on coping skills and develop methods for reestablishing balance. She stated that mindfulness and making time to do the things she enjoys helps to re attain a sense of peace and balance.     Gallagher, Pamela Steenson 10/07/2012, 3:20 PM

## 2012-10-07 NOTE — Progress Notes (Signed)
Recreation Therapy Notes  Date: 06.19.2014 Time: 3:00pm Location: [address] Dayroom      Group Topic/Focus: Communication, Journalist, newspaper, Team Work  Participation Level: Active  Participation Quality: Appropriate  Affect: Euhtymic  Cognitive: Appropriate   Additional Comments: Activity: Landing Pad ; Explanation: Patients worked as a team. Patients were given 10 drinking straws and a length of masking tape. Using the straws and the medical tape patients were asked to create a landing pad that would catch a golf ball dropped from approximately 6 feet.    Patient actively participated in group activity. Patient assisted peers with building landing pad. Group landing pad not successful at capturing golf ball. Patient contributed to wrap up discussion about the importance of using good communication skills, problem solving skills, and team work to enhance relapse prevention. Patient related how members of group helped when needed and stepped away when needed to her support system at home. Patient shared that she has a good support system that she can rely on, which will help her stay sober post d/c.   Marykay Lex Lynisha Osuch, LRT/CTRS  Sly Parlee L 10/07/2012 4:07 PM

## 2012-10-07 NOTE — [facility] Group Notes (Signed)
[facility] LCSW Aftercare Discharge Planning Group Note   10/07/2012 10:09 AM  Participation Quality:  Appropriate   Mood/Affect:  Appropriate  Depression Rating:  0  Anxiety Rating:  0  Thoughts of Suicide:  No Will you contract for safety?   Yes  Current AVH:  No  Plan for Discharge/Comments: Pt goes to [facility] for psychiatry and is interested in [facility] in [place] for therapy. Will call with pt today to schedule therapy appt that fits her school schedule. Pt hoping to d/c Friday.   Transportation Means: family/mother  Supports: Warehouse manager, Research scientist (physical sciences)

## 2012-10-07 NOTE — Progress Notes (Signed)
Patient did attend the evening karaoke group. Pt was engaged and supportive but did not sing a song.   

## 2012-10-07 NOTE — Progress Notes (Addendum)
Patient ID: Pamela Gallagher, female   DOB: 10-31-1985, 27 y.o.   MRN: [id number] D: patient in Day Room interacting with others.  She is attending groups.  She interacts well with staff and is cooperative.  She denies any withdrawal symptoms at this time.  She stated she was surprised that she was withdrawing so well considering the amount of xanax she was taking.  She has previously attempted to detox herself and stated, "I was told that was not a good idea."  Patient feels her treatment is progressing well. Patient denies any SI/HI/AVH. A: continue to monitor medication management and MD orders.  Encourage patient to continue to attend groups and participate in her treatment.   R: patient's behavior is appropriate; she is receptive to staff.

## 2012-10-07 NOTE — Progress Notes (Signed)
[facility] MD Progress Note  10/07/2012 2:20 PM Pamela Gallagher  MRN:  [id number]  Subjective: "I believe that I have changed my state of mind by being here. I'm actually trying to see if my dad will come to a place like this and get help. Group sessions, AA/NA meetings helped quite much too. Having to share my struggles with the others who are facing the same problems made it worth while. I planned on changing my environment, lose old friends, make new ones. Find a job and start going to bed on time, once I get discharged".  Diagnosis:   Axis I: Benzodiazepine dependence Axis II: Deferred Axis III:  Past Medical History  Diagnosis Date  . UTI (lower urinary tract infection)   . PTSD (post-traumatic stress disorder)     "adultified child"  . Anxiety and depression   . Abnormal Pap smear 2011  . Asthma    Axis IV: other psychosocial or environmental problems Axis V: 41-50 serious symptoms  ADL's:  Intact  Sleep: "Improved"  Appetite:  "Improved"  Suicidal Ideation:  Plan:  Denies Intent:  Denies Means:  Denies Homicidal Ideation:  Plan:  Denies Intent:  Denies Means:  Denies AEB (as evidenced by):  Psychiatric Specialty Exam: Review of Systems  Constitutional: Negative.   HENT: Negative.   Eyes: Negative.   Respiratory: Negative.   Cardiovascular: Negative.   Gastrointestinal: Negative.   Genitourinary: Negative.   Skin: Negative.   Neurological: Negative.   Endo/Heme/Allergies: Negative.   Psychiatric/Behavioral: Positive for depression (Currently being stabilized with medication) and substance abuse (Benzodiazepine dependence). Negative for suicidal ideas, hallucinations and memory loss. The patient is nervous/anxious (Currently being stabilized with medication) and has insomnia (Currently being stabilized with medication).     Blood pressure 120/86, pulse 101, temperature 98.3 F (36.8 C), temperature source Oral, resp. rate 20, height 5\' 3"  (1.6 m), weight 52.617 kg  (116 lb), last menstrual period 09/29/2012, SpO2 99.00%.Body mass index is 20.55 kg/(m^2).  General Appearance: Fairly Groomed  Patent attorney::  Good  Speech:  Clear and Coherent  Volume:  Normal  Mood:  Anxious and Depressed  Affect:  Flat  Thought Process:  Coherent and Intact  Orientation:  Full (Time, Place, and Person)  Thought Content:  Rumination  Suicidal Thoughts:  No  Homicidal Thoughts:  No  Memory:  Immediate;   Good Recent;   Good Remote;   Good  Judgement:  Fair  Insight:  Fair  Psychomotor Activity:  Normal  Concentration:  Fair  Recall:  Good  Akathisia:  No  Handed:  Right  AIMS (if indicated):     Assets:  Desire for Improvement  Sleep:  Number of Hours: 6   Current Medications: Current Facility-Administered Medications  Medication Dose Route Frequency Provider Last Rate Last Dose  . acetaminophen (TYLENOL) tablet 650 mg  650 mg Oral Q6H PRN Kerry Hough, PA-C      . albuterol (PROVENTIL HFA;VENTOLIN HFA) 108 (90 BASE) MCG/ACT inhaler 2 puff  2 puff Inhalation Q6H PRN Kerry Hough, PA-C      . alum & mag hydroxide-simeth (MAALOX/MYLANTA) 200-200-20 MG/5ML suspension 30 mL  30 mL Oral Q4H PRN Kerry Hough, PA-C      . amitriptyline (ELAVIL) tablet 25 mg  25 mg Oral QHS Kerry Hough, PA-C   25 mg at 10/06/12 2151  . chlordiazePOXIDE (LIBRIUM) capsule 25 mg  25 mg Oral QID PRN Kerry Hough, PA-C      .  cyclobenzaprine (FLEXERIL) tablet 5 mg  5 mg Oral TID PRN Kerry Hough, PA-C      . FLUoxetine (PROZAC) capsule 20 mg  20 mg Oral Daily Larena Sox, MD   20 mg at 10/07/12 0747  . gabapentin (NEURONTIN) capsule 300 mg  300 mg Oral TID Kerry Hough, PA-C   300 mg at 10/07/12 1158  . magnesium hydroxide (MILK OF MAGNESIA) suspension 30 mL  30 mL Oral Daily PRN Kerry Hough, PA-C      . multivitamin with minerals tablet 1 tablet  1 tablet Oral Daily Jeoffrey Massed, RD   1 tablet at 10/07/12 0747  . naproxen (NAPROSYN) tablet 500 mg  500 mg  Oral BID PRN Kerry Hough, PA-C   500 mg at 10/05/12 2133  . nicotine (NICODERM CQ - dosed in mg/24 hours) patch 14 mg  14 mg Transdermal Q0600 Kerry Hough, PA-C   14 mg at 10/07/12 0645  . prazosin (MINIPRESS) capsule 1 mg  1 mg Oral QHS Kerry Hough, PA-C   1 mg at 10/06/12 2151    Lab Results: No results found for this or any previous visit (from the past 48 hour(s)).  Physical Findings: AIMS: Facial and Oral Movements Muscles of Facial Expression: None, normal Lips and Perioral Area: None, normal Jaw: None, normal Tongue: None, normal,Extremity Movements Upper (arms, wrists, hands, fingers): None, normal Lower (legs, knees, ankles, toes): None, normal, Trunk Movements Neck, shoulders, hips: None, normal, Overall Severity Severity of abnormal movements (highest score from questions above): None, normal Incapacitation due to abnormal movements: None, normal Patient's awareness of abnormal movements (rate only patient's report): No Awareness, Dental Status Current problems with teeth and/or dentures?: No Does patient usually wear dentures?: No  CIWA:  CIWA-Ar Total: 3 COWS:     Treatment Plan Summary: Daily contact with patient to assess and evaluate symptoms and progress in treatment Medication management  Plan: Supportive approach/coping skills/relapse prevention. Encouraged out of room, participation in group sessions and application of coping skills when distressed. Will continue to monitor response to/adverse effects of medications in use to assure effectiveness. Continue to monitor mood, behavior and interaction with staff and other patients. Discharge plan in progress. Continue current plan of care.  Medical Decision Making Problem Points:  Established problem, stable/improving (1), Review of last therapy session (1) and Review of psycho-social stressors (1) Data Points:  Review of medication regiment & side effects (2) Review of new medications or change in  dosage (2)  I certify that inpatient services furnished can reasonably be expected to improve the patient's condition.   Armandina Stammer I, PMHNP-BC 10/07/2012, 2:20 PM

## 2012-10-07 NOTE — Progress Notes (Signed)
Patient ID: Pamela Gallagher, female   DOB: 1986-01-08, 27 y.o.   MRN: [id number] D: Patient denies SI/HI/AVH and pain. Pt mood/ affect is appropriate to circumstance. Pt stated she will be discharged tomorrow and she will be relocating to be away from bad influence.  Pt attended evening karaoke and was engaged and supportive of peers.  Pt denies any needs or concerns.  Cooperative with assessment. No acute distressed noted at this time.   A: Met with pt 1:1. Medications administered as prescribed. Writer encouraged pt to discuss feelings. Pt encouraged to come to staff with any question or concerns. 15 minutes checks for safety.  R: Patient remains safe. She is complaint with medications and denies any adverse reaction. Continue current POC.

## 2012-10-08 MED ORDER — AMITRIPTYLINE HCL 25 MG PO TABS: 25.0000 mg | ORAL_TABLET | Freq: Every day | ORAL | Status: DC

## 2012-10-08 MED ORDER — GABAPENTIN 300 MG PO CAPS: 300.0000 mg | ORAL_CAPSULE | Freq: Three times a day (TID) | ORAL | Status: DC

## 2012-10-08 MED ORDER — PRAZOSIN HCL 1 MG PO CAPS: 1.0000 mg | ORAL_CAPSULE | Freq: Every day | ORAL | Status: DC

## 2012-10-08 MED ORDER — CYCLOBENZAPRINE HCL 5 MG PO TABS: 5.0000 mg | ORAL_TABLET | Freq: Three times a day (TID) | ORAL | Status: DC | PRN

## 2012-10-08 MED ORDER — FLUOXETINE HCL 20 MG PO CAPS: 20.0000 mg | ORAL_CAPSULE | Freq: Every day | ORAL | Status: DC

## 2012-10-08 MED ORDER — ALBUTEROL SULFATE HFA 108 (90 BASE) MCG/ACT IN AERS: 2.0000 | INHALATION_SPRAY | Freq: Four times a day (QID) | RESPIRATORY_TRACT | Status: DC | PRN

## 2012-10-08 NOTE — BHH Group Notes (Signed)
[facility] LCSW Aftercare Discharge Planning Group Note   10/08/2012 9:40 AM  Participation Quality:  Appropriate  Mood/Affect:  Appropriate  Depression Rating:  0  Anxiety Rating:  0  Thoughts of Suicide:  No Will you contract for safety?   NA  Current AVH:  No  Plan for Discharge/Comments:  Pt to follow up at [facility] for med management and has appt with therapist at [facility] for next week. Pt feels confident in discharge plan as expressed in morning group.   Transportation Means: Friend  Supports: Engineer, water, Research scientist (physical sciences)

## 2012-10-08 NOTE — Progress Notes (Signed)
[facility] Adult Case Management Discharge Plan :  Will you be returning to the same living situation after discharge: No. At discharge, do you have transportation home?:Yes,  friend Do you have the ability to pay for your medications:Yes,  mental health  Release of information consent forms completed and in the chart;  Patient's signature needed at discharge.  Patient to Follow up at: Follow-up Information   Follow up with [facility] . (Walk-in between 8am-9am Monday through Friday for hospital followup.)    Contact information:   [address], [place] [address] phone: [phone] fax: [phone]      Follow up with [facility] On 10/15/2012. (Appt at 10:00AM with Elroy Channel for therapy)    Contact information:   [facility] [address], [place] [address] phone: [phone] fax: [phone]      Patient denies SI/HI:   Yes,  in group    Safety Planning and Suicide Prevention discussed:  Yes,  pt did not endorse SI during admission or during stay at [facility]. no SPE required. Pt provided with SPI pamplet.   Smart, Tijuan Dantes 10/08/2012, 10:17 AM

## 2012-10-08 NOTE — Progress Notes (Signed)
Adult Psychoeducational Group Note  Date:  10/08/2012 Time:  11:00AM Group Topic/Focus:  Relapse Prevention Planning:   The focus of this group is to define relapse and discuss the need for planning to combat relapse.  Participation Level:  Active  Participation Quality:  Appropriate and Attentive  Affect:  Appropriate  Cognitive:  Alert and Appropriate  Insight: Appropriate  Engagement in Group:  Engaged  Modes of Intervention:  Discussion  Additional Comments:  Pt. Was attentive and appropriate during today's group discussion. Pt shared that she is making changes and will not be around negative people and stay away from places that will cause her to relapse. Pt stated that she will continue to take her medication and not stop taking it once she is feeling better.   Bing Plume D 10/08/2012, 12:55 PM

## 2012-10-08 NOTE — Progress Notes (Signed)
Adult Psychoeducational Group Note  Date:  10/08/2012 Time:  2:55 PM  Group Topic/Focus:  Therapeutic Activity  Participation Level:  Active  Participation Quality:  Appropriate, Attentive, Sharing and Supportive  Affect:  Appropriate and Cheerful  Cognitive:  Alert, Appropriate and Oriented  Insight: Appropriate and Good  Engagement in Group:  Engaged  Modes of Intervention:  Activity, Education and Socialization  Additional Comments:  Pt was asked to participate in Pictionary for group. Pt was bright, cheerful and engaged throughout group. Pt was focused on topic and purpose of group, participating throughout entire group. Pt was able to socialize with other pts appropriately.     Dalia Heading 10/08/2012, 2:55 PM
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
## 2012-10-08 NOTE — Progress Notes (Signed)
[name] states " I am ready to go home today". SHe completes her AM self inventory and on it she denied SI within the past 24 hrs, she rated her depression and hopelessness " 2/2" and stated  Her DC plan is " eating, schedule, exercise". When asked by this Clinical research associate, she stated she has learned that " I am in control of my life and I am responsible for the choices I make".   A MD completed DC AVS as well as DC order. This Clinical research associate reviewed DC AVS with pt, she stated she understood and will comply   And pt given sample meds as well as prescriptions from MD. ALl belongings returned to pt, and pt escorted to bldg entrance and DC'd amb per MD order.   R Safety in place and pt DC'd.

## 2012-10-08 NOTE — [facility] Suicide Risk Assessment (Signed)
Suicide Risk Assessment  Discharge Assessment     Demographic Factors:  Low socioeconomic status, Unemployed and female  Mental Status Per Nursing Assessment::   On Admission:     Current Mental Status by Physician: patient denies suicidal ideation, intent or plan  Loss Factors: Financial problems/change in socioeconomic status  Historical Factors: Impulsivity  Risk Reduction Factors:   Sense of responsibility to family, Living with another person, especially a relative and Positive social support  Continued Clinical Symptoms:  Alcohol/Substance Abuse/Dependencies  Cognitive Features That Contribute To Risk:  Closed-mindedness    Suicide Risk:  Minimal: No identifiable suicidal ideation.  Patients presenting with no risk factors but with morbid ruminations; may be classified as minimal risk based on the severity of the depressive symptoms  Discharge Diagnoses:   AXIS I:  Benzodiazepine dependence  AXIS II:  Deferred AXIS III:   Past Medical History  Diagnosis Date  . UTI (lower urinary tract infection)   . PTSD (post-traumatic stress disorder)     "adultified child"  . Anxiety and depression   . Abnormal Pap smear 2011  . Asthma    AXIS IV:  other psychosocial or environmental problems and problems related to social environment AXIS V:  61-70 mild symptoms  Plan Of Care/Follow-up recommendations:  Activity:  as tolerated Diet:  healthy Tests:  routine Other:  patient to keep her after care appointment  Is patient on multiple antipsychotic therapies at discharge:  No   Has Patient had three or more failed trials of antipsychotic monotherapy by history:  No  Recommended Plan for Multiple Antipsychotic Therapies: N/A  Micheale Schlack,MD 10/08/2012, 11:23 AM

## 2012-10-11 NOTE — Discharge Summary (Signed)
Seen and agreed. Pamela Ambrosius, MD 

## 2012-10-13 NOTE — Progress Notes (Signed)
Patient Discharge Instructions:  After Visit Summary (AVS):   Faxed to:  10/13/12 Discharge Summary Note:   Faxed to:  10/13/12 Psychiatric Admission Assessment Note:   Faxed to:  10/13/12 Suicide Risk Assessment - Discharge Assessment:   Faxed to:  10/13/12 Faxed/Sent to the Next Level Care provider:  10/13/12 Faxed to [facility] @ [phone] Faxed to [facility] @ [phone]  Jerelene Redden, 10/13/2012, 11:50 AM

## 2012-10-14 ENCOUNTER — Encounter (HOSPITAL_COMMUNITY): Payer: Self-pay | Admitting: Emergency Medicine

## 2012-10-14 ENCOUNTER — Emergency Department (HOSPITAL_COMMUNITY)
Admission: EM | Admit: 2012-10-14 | Discharge: 2012-10-15 | Disposition: A | Discharge status: Home / Self Care | Payer: Federal, State, Local not specified - Other | Attending: Emergency Medicine | Admitting: Emergency Medicine

## 2012-10-14 DIAGNOSIS — R630 Anorexia: Secondary | ICD-10-CM | POA: Insufficient documentation

## 2012-10-14 DIAGNOSIS — J45909 Unspecified asthma, uncomplicated: Secondary | ICD-10-CM | POA: Insufficient documentation

## 2012-10-14 DIAGNOSIS — F1320 Sedative, hypnotic or anxiolytic dependence, uncomplicated: Secondary | ICD-10-CM

## 2012-10-14 DIAGNOSIS — F341 Dysthymic disorder: Secondary | ICD-10-CM | POA: Insufficient documentation

## 2012-10-14 DIAGNOSIS — F39 Unspecified mood [affective] disorder: Secondary | ICD-10-CM

## 2012-10-14 DIAGNOSIS — F17200 Nicotine dependence, unspecified, uncomplicated: Secondary | ICD-10-CM | POA: Insufficient documentation

## 2012-10-14 DIAGNOSIS — F32A Depression, unspecified: Secondary | ICD-10-CM

## 2012-10-14 DIAGNOSIS — F1920 Other psychoactive substance dependence, uncomplicated: Secondary | ICD-10-CM | POA: Insufficient documentation

## 2012-10-14 DIAGNOSIS — Z87440 Personal history of urinary (tract) infections: Secondary | ICD-10-CM | POA: Insufficient documentation

## 2012-10-14 DIAGNOSIS — F410 Panic disorder [episodic paroxysmal anxiety] without agoraphobia: Secondary | ICD-10-CM

## 2012-10-14 DIAGNOSIS — F4310 Post-traumatic stress disorder, unspecified: Secondary | ICD-10-CM | POA: Insufficient documentation

## 2012-10-14 DIAGNOSIS — Z880 Allergy status to penicillin: Secondary | ICD-10-CM | POA: Insufficient documentation

## 2012-10-14 DIAGNOSIS — Z79899 Other long term (current) drug therapy: Secondary | ICD-10-CM | POA: Insufficient documentation

## 2012-10-14 DIAGNOSIS — F329 Major depressive disorder, single episode, unspecified: Secondary | ICD-10-CM

## 2012-10-14 DIAGNOSIS — F419 Anxiety disorder, unspecified: Secondary | ICD-10-CM

## 2012-10-14 DIAGNOSIS — IMO0002 Reserved for concepts with insufficient information to code with codable children: Secondary | ICD-10-CM | POA: Insufficient documentation

## 2012-10-14 DIAGNOSIS — G478 Other sleep disorders: Secondary | ICD-10-CM | POA: Insufficient documentation

## 2012-10-14 LAB — CBC
HCT: 45.8 % (ref 36.0–46.0)
Hemoglobin: 15.7 g/dL — ABNORMAL HIGH (ref 12.0–15.0)
MCH: 30.8 pg (ref 26.0–34.0)
MCHC: 34.3 g/dL (ref 30.0–36.0)
RDW: 11.9 % (ref 11.5–15.5)

## 2012-10-14 LAB — COMPREHENSIVE METABOLIC PANEL
Albumin: 3.8 g/dL (ref 3.5–5.2)
Alkaline Phosphatase: 83 U/L (ref 39–117)
BUN: 8 mg/dL (ref 6–23)
Calcium: 9.5 mg/dL (ref 8.4–10.5)
Creatinine, Ser: 0.89 mg/dL (ref 0.50–1.10)
GFR calc Af Amer: >90 mL/min (ref >90)
Glucose, Bld: 79 mg/dL (ref 70–99)
Potassium: 4.2 mEq/L (ref 3.5–5.1)
Total Protein: 7.4 g/dL (ref 6.0–8.3)

## 2012-10-14 LAB — RAPID URINE DRUG SCREEN, HOSP PERFORMED
Barbiturates: NOT DETECTED
Benzodiazepines: POSITIVE — AB
Cocaine: NOT DETECTED
Opiates: NOT DETECTED
Tetrahydrocannabinol: POSITIVE — AB

## 2012-10-14 LAB — ETHANOL: Alcohol, Ethyl (B): <11 mg/dL (ref 0–11)

## 2012-10-14 MED ORDER — ONDANSETRON HCL 4 MG PO TABS: 4.0000 mg | ORAL_TABLET | Freq: Three times a day (TID) | ORAL | Status: DC | PRN

## 2012-10-14 MED ORDER — SODIUM CHLORIDE 0.9 % IV SOLN: INTRAVENOUS | Status: DC

## 2012-10-14 MED ORDER — PRAZOSIN HCL 1 MG PO CAPS
1.0000 mg | ORAL_CAPSULE | Freq: Every day | ORAL | Status: DC
  Filled 2012-10-14 (×2): qty 1

## 2012-10-14 MED ORDER — CYCLOBENZAPRINE HCL 10 MG PO TABS: 5.0000 mg | ORAL_TABLET | Freq: Three times a day (TID) | ORAL | Status: DC | PRN

## 2012-10-14 MED ORDER — FLUOXETINE HCL 20 MG PO CAPS
20.0000 mg | ORAL_CAPSULE | Freq: Every day | ORAL | Status: DC
  Administered 2012-10-15: 20 mg via ORAL
  Filled 2012-10-14: qty 1

## 2012-10-14 MED ORDER — IBUPROFEN 600 MG PO TABS: 600.0000 mg | ORAL_TABLET | Freq: Three times a day (TID) | ORAL | Status: DC | PRN

## 2012-10-14 MED ORDER — AMITRIPTYLINE HCL 25 MG PO TABS: 25.0000 mg | ORAL_TABLET | Freq: Every day | ORAL | Status: DC

## 2012-10-14 MED ORDER — ALBUTEROL SULFATE HFA 108 (90 BASE) MCG/ACT IN AERS: 2.0000 | INHALATION_SPRAY | Freq: Four times a day (QID) | RESPIRATORY_TRACT | Status: DC | PRN

## 2012-10-14 MED ORDER — LORAZEPAM 1 MG PO TABS: 1.0000 mg | ORAL_TABLET | Freq: Three times a day (TID) | ORAL | Status: DC | PRN

## 2012-10-14 MED ORDER — NICOTINE 21 MG/24HR TD PT24
21.0000 mg | MEDICATED_PATCH | Freq: Every day | TRANSDERMAL | Status: DC
  Administered 2012-10-14 – 2012-10-15 (×2): 21 mg via TRANSDERMAL
  Filled 2012-10-14 (×2): qty 1

## 2012-10-14 MED ORDER — SODIUM CHLORIDE 0.9 % IV BOLUS (SEPSIS)
1000.0000 mL | Freq: Once | INTRAVENOUS | Status: AC
  Administered 2012-10-14: 1000 mL via INTRAVENOUS

## 2012-10-14 MED ORDER — GABAPENTIN 300 MG PO CAPS
300.0000 mg | ORAL_CAPSULE | Freq: Three times a day (TID) | ORAL | Status: DC
  Administered 2012-10-14 – 2012-10-15 (×2): 300 mg via ORAL
  Filled 2012-10-14 (×5): qty 1

## 2012-10-14 MED ORDER — ACETAMINOPHEN 325 MG PO TABS: 650.0000 mg | ORAL_TABLET | ORAL | Status: DC | PRN

## 2012-10-14 MED ORDER — ZOLPIDEM TARTRATE 5 MG PO TABS: 5.0000 mg | ORAL_TABLET | Freq: Every evening | ORAL | Status: DC | PRN

## 2012-10-14 NOTE — ED Notes (Signed)
Pt with one bag of belongings in locker #27 cell phone, 1 t-shirt, 1 pajama pant

## 2012-10-14 NOTE — ED Notes (Signed)
Patient states she is unable to urinate at this time, has bladder issues
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
## 2012-10-14 NOTE — ED Notes (Signed)
Pt presenting to ed with c/o taking taking approximately 6 xanax. Pt states she is in a bad situation and she wanted to take a bunch of pills to go to sleep. Pt is very lethargic in triage pt states she just got out of [facility] on Friday. Pt states he depression is so bad she just wants to go to sleep. Pt states she does not want to kill herself

## 2012-10-14 NOTE — ED Notes (Signed)
Phoned poison control per representative pt needs supportive care and watch airway intubate if needed do not give romazicon it can cause seizures
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
## 2012-10-14 NOTE — ED Notes (Signed)
Pt assisted to restroom in wheelchair pt unstable on her feet

## 2012-10-14 NOTE — Progress Notes (Signed)
[facility] has seen patient and provided her with a oc application. °

## 2012-10-14 NOTE — ED Notes (Signed)
Verbal report given to [facility]- psych ED, pt eating. Will transfer pt when she has finished meal.

## 2012-10-14 NOTE — ED Notes (Signed)
Pt denies attempt to harm self or others and states "I just want to sleep."

## 2012-10-14 NOTE — ED Notes (Addendum)
Pt noted to be extremely lethargic, noxious stimuli required to arouse pt. Dr Denton Lank aware. Verbal order given to place pt on continuous pulse ox and continue administering IV fluids.

## 2012-10-15 ENCOUNTER — Emergency Department (HOSPITAL_COMMUNITY)
Admission: EM | Admit: 2012-10-15 | Discharge: 2012-10-16 | Disposition: A | Discharge status: Home / Self Care | Payer: Self-pay | Attending: Emergency Medicine | Admitting: Emergency Medicine

## 2012-10-15 ENCOUNTER — Encounter (HOSPITAL_COMMUNITY): Payer: Self-pay | Admitting: Registered Nurse

## 2012-10-15 ENCOUNTER — Encounter (HOSPITAL_COMMUNITY): Payer: Self-pay

## 2012-10-15 DIAGNOSIS — F410 Panic disorder [episodic paroxysmal anxiety] without agoraphobia: Secondary | ICD-10-CM

## 2012-10-15 DIAGNOSIS — Z79899 Other long term (current) drug therapy: Secondary | ICD-10-CM | POA: Insufficient documentation

## 2012-10-15 DIAGNOSIS — J45909 Unspecified asthma, uncomplicated: Secondary | ICD-10-CM | POA: Insufficient documentation

## 2012-10-15 DIAGNOSIS — Z87440 Personal history of urinary (tract) infections: Secondary | ICD-10-CM | POA: Insufficient documentation

## 2012-10-15 DIAGNOSIS — F419 Anxiety disorder, unspecified: Secondary | ICD-10-CM

## 2012-10-15 DIAGNOSIS — Z880 Allergy status to penicillin: Secondary | ICD-10-CM | POA: Insufficient documentation

## 2012-10-15 DIAGNOSIS — F3289 Other specified depressive episodes: Secondary | ICD-10-CM

## 2012-10-15 DIAGNOSIS — F39 Unspecified mood [affective] disorder: Secondary | ICD-10-CM

## 2012-10-15 DIAGNOSIS — F411 Generalized anxiety disorder: Secondary | ICD-10-CM

## 2012-10-15 DIAGNOSIS — F329 Major depressive disorder, single episode, unspecified: Secondary | ICD-10-CM

## 2012-10-15 DIAGNOSIS — F17200 Nicotine dependence, unspecified, uncomplicated: Secondary | ICD-10-CM | POA: Insufficient documentation

## 2012-10-15 DIAGNOSIS — F4310 Post-traumatic stress disorder, unspecified: Secondary | ICD-10-CM

## 2012-10-15 DIAGNOSIS — Z3202 Encounter for pregnancy test, result negative: Secondary | ICD-10-CM | POA: Insufficient documentation

## 2012-10-15 DIAGNOSIS — F341 Dysthymic disorder: Secondary | ICD-10-CM | POA: Insufficient documentation

## 2012-10-15 LAB — CBC
HCT: 44.1 % (ref 36.0–46.0)
Hemoglobin: 15.5 g/dL — ABNORMAL HIGH (ref 12.0–15.0)
MCH: 31.1 pg (ref 26.0–34.0)
MCHC: 35.1 g/dL (ref 30.0–36.0)
MCV: 88.6 fL (ref 78.0–100.0)
Platelets: 222 10*3/uL (ref 150–400)
RBC: 4.98 MIL/uL (ref 3.87–5.11)
RDW: 12 % (ref 11.5–15.5)
WBC: 9.4 10*3/uL (ref 4.0–10.5)

## 2012-10-15 LAB — COMPREHENSIVE METABOLIC PANEL
Albumin: 4.2 g/dL (ref 3.5–5.2)
BUN: 8 mg/dL (ref 6–23)
CO2: 27 mEq/L (ref 19–32)
Chloride: 101 mEq/L (ref 96–112)
Creatinine, Ser: 0.93 mg/dL (ref 0.50–1.10)
GFR calc Af Amer: >90 mL/min (ref >90)
GFR calc non Af Amer: 83 mL/min — ABNORMAL LOW (ref >90)
Total Bilirubin: 0.5 mg/dL (ref 0.3–1.2)

## 2012-10-15 LAB — SALICYLATE LEVEL: Salicylate Lvl: <2 mg/dL — ABNORMAL LOW (ref 2.8–20.0)

## 2012-10-15 LAB — ACETAMINOPHEN LEVEL: Acetaminophen (Tylenol), Serum: <15 ug/mL (ref 10–30)

## 2012-10-15 LAB — RAPID URINE DRUG SCREEN, HOSP PERFORMED
Amphetamines: NOT DETECTED
Opiates: NOT DETECTED

## 2012-10-15 LAB — ETHANOL: Alcohol, Ethyl (B): <11 mg/dL (ref 0–11)

## 2012-10-15 MED ORDER — ZOLPIDEM TARTRATE 5 MG PO TABS: 5.0000 mg | ORAL_TABLET | Freq: Every evening | ORAL | Status: DC | PRN

## 2012-10-15 MED ORDER — CHLORDIAZEPOXIDE HCL 25 MG PO CAPS: 25.0000 mg | ORAL_CAPSULE | Freq: Four times a day (QID) | ORAL | Status: DC | PRN

## 2012-10-15 MED ORDER — ALUM & MAG HYDROXIDE-SIMETH 200-200-20 MG/5ML PO SUSP: 30.0000 mL | ORAL | Status: DC | PRN

## 2012-10-15 MED ORDER — LORAZEPAM 1 MG PO TABS: 1.0000 mg | ORAL_TABLET | Freq: Three times a day (TID) | ORAL | Status: DC | PRN

## 2012-10-15 MED ORDER — ONDANSETRON HCL 4 MG PO TABS: 4.0000 mg | ORAL_TABLET | Freq: Three times a day (TID) | ORAL | Status: DC | PRN

## 2012-10-15 MED ORDER — AMITRIPTYLINE HCL 25 MG PO TABS: 25.0000 mg | ORAL_TABLET | Freq: Every day | ORAL | Status: DC

## 2012-10-15 MED ORDER — IBUPROFEN 600 MG PO TABS: 600.0000 mg | ORAL_TABLET | Freq: Three times a day (TID) | ORAL | Status: DC | PRN

## 2012-10-15 MED ORDER — ALBUTEROL SULFATE HFA 108 (90 BASE) MCG/ACT IN AERS: 2.0000 | INHALATION_SPRAY | Freq: Four times a day (QID) | RESPIRATORY_TRACT | Status: DC | PRN

## 2012-10-15 MED ORDER — PRAZOSIN HCL 1 MG PO CAPS
1.0000 mg | ORAL_CAPSULE | Freq: Every day | ORAL | Status: DC
Start: 2012-10-15 — End: 2012-10-16
  Filled 2012-10-15 (×2): qty 1

## 2012-10-15 MED ORDER — FLUOXETINE HCL 20 MG PO CAPS
20.0000 mg | ORAL_CAPSULE | Freq: Every day | ORAL | Status: DC
  Filled 2012-10-15: qty 1

## 2012-10-15 MED ORDER — NICOTINE 21 MG/24HR TD PT24: 21.0000 mg | MEDICATED_PATCH | Freq: Every day | TRANSDERMAL | Status: DC

## 2012-10-15 MED ORDER — GABAPENTIN 300 MG PO CAPS
300.0000 mg | ORAL_CAPSULE | Freq: Three times a day (TID) | ORAL | Status: DC
  Filled 2012-10-15 (×4): qty 1

## 2012-10-15 NOTE — ED Provider Notes (Signed)
Medical screening examination/treatment/procedure(s) were performed by non-physician practitioner and as supervising physician I was immediately available for consultation/collaboration.   Benny Lennert, MD 10/15/12 [phone]

## 2012-10-15 NOTE — ED Notes (Signed)
Pt was seen earlier today and cleared by psychiatrist and given referrals. Her roommate took out IVC papers on her stating pt was texting people saying she was or did overdose on Xanax. Pt adamantly denies all accusations. She denies being suicidal or homicidal or voices or SA problems. She said her roommate is upset because she's moving and the roommate isn't moving with her another friend is even though she asked roommate and roommate said she was moving too far for her to drive so she didn't want to move with the pt. Pt is very upset as she has to have all her stuff out of the trailer by tomorrow or they are taking it away and she will lose all her stuff. She is [place] about [place], she can have her dogs, plant a garden, be closer to her mom and brothers and still see her team at [facility] for treatment. She is med compliant.
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
## 2012-10-15 NOTE — ED Provider Notes (Signed)
History  This chart was scribed for Dierdre Forth- PA by Manuela Schwartz, ED scribe. This patient was seen in room WTR4/WLPT4 and the patient's care was started at 1908.  CSN: [id number] Arrival date & time 10/15/12  1908  First MD Initiated Contact with Patient 10/15/12 2019     Chief Complaint  Patient presents with  . Medical Clearance   The history is provided by the patient. No language interpreter was used.   HPI Comments: Pamela Gallagher is a 27 y.o. female who presents to the Emergency Department via [employer] with IVC papers placed by her roommate. She was seen here yesterday for psychiatric issues and feelings of depression. She claims that she was seen here yesterday b/c she accidentally took the wrong medicine at home. She states that today her roommate placed IVC papers saying that she was suicidal and going to harm herself b/c she is angry at her. Tonight she denies suicidal ideations, a plan to harm herself, homicidal ideations, hallucinations or hearing voices. She states that yesterday or within the past few months that she has not felt suicidal. She states no longer taking her xanax but is taking all of her other prescribed medicines.     Past Medical History  Diagnosis Date  . UTI (lower urinary tract infection)   . PTSD (post-traumatic stress disorder)     "adultified child"  . Anxiety and depression   . Abnormal Pap smear 2011  . Asthma    Past Surgical History  Procedure Laterality Date  . Thyroglossoduct cyst      x 2  . Wisdom tooth extraction     Family History  Problem Relation Age of Onset  . Depression Mother   . Anxiety disorder Mother   . Depression Father   . Hearing loss Paternal Uncle    History  Substance Use Topics  . Smoking status: Current Every Day Smoker -- 0.50 packs/day for .5 years    Types: Cigarettes  . Smokeless tobacco: Never Used  . Alcohol Use: Yes     Comment: social   OB History   Grav Para Term Preterm Abortions TAB SAB  Ect Mult Living   0              Review of Systems  Constitutional: Negative for fever, diaphoresis, appetite change, fatigue and unexpected weight change.  HENT: Negative for mouth sores and neck stiffness.   Eyes: Negative for visual disturbance.  Respiratory: Negative for cough, chest tightness, shortness of breath and wheezing.   Cardiovascular: Negative for chest pain.  Gastrointestinal: Negative for nausea, vomiting, abdominal pain, diarrhea and constipation.  Endocrine: Negative for polydipsia, polyphagia and polyuria.  Genitourinary: Negative for dysuria, urgency, frequency and hematuria.  Musculoskeletal: Negative for back pain.  Skin: Negative for rash.  Allergic/Immunologic: Negative for immunocompromised state.  Neurological: Negative for syncope, light-headedness and headaches.  Hematological: Does not bruise/bleed easily.  Psychiatric/Behavioral: Negative for suicidal ideas, hallucinations and self-injury.  All other systems reviewed and are negative.   A complete 10 system review of systems was obtained and all systems are negative except as noted in the HPI and PMH.   Allergies  Clindamycin/lincomycin; Penicillins; and Sulfa antibiotics  Home Medications   Current Outpatient Rx  Name  Route  Sig  Dispense  Refill  . albuterol (PROVENTIL HFA;VENTOLIN HFA) 108 (90 BASE) MCG/ACT inhaler   Inhalation   Inhale 2 puffs into the lungs every 6 (six) hours as needed. Wheezing         .  amitriptyline (ELAVIL) 25 MG tablet   Oral   Take 1 tablet (25 mg total) by mouth at bedtime. For sleep   30 tablet   0   . FLUoxetine (PROZAC) 20 MG capsule   Oral   Take 1 capsule (20 mg total) by mouth daily. For depression   30 capsule   0   . gabapentin (NEURONTIN) 300 MG capsule   Oral   Take 1 capsule (300 mg total) by mouth 3 (three) times daily. For anxiety   90 capsule   0   . prazosin (MINIPRESS) 1 MG capsule   Oral   Take 1 capsule (1 mg total) by mouth at  bedtime. For night mares   30 capsule   0    Triage Vitals: BP 113/90  Pulse 86  Temp(Src) 98.3 F (36.8 C) (Oral)  Resp 18  SpO2 100%  LMP 09/29/2012 Physical Exam  Nursing note and vitals reviewed. Constitutional: She is oriented to person, place, and time. She appears well-developed and well-nourished. No distress.  HENT:  Head: Normocephalic and atraumatic.  Mouth/Throat: Oropharynx is clear and moist. No oropharyngeal exudate.  Eyes: Conjunctivae are normal. Pupils are equal, round, and reactive to light. No scleral icterus.  Neck: Normal range of motion. Neck supple.  Cardiovascular: Normal rate, regular rhythm, normal heart sounds and intact distal pulses.   No murmur heard. Pulmonary/Chest: Effort normal and breath sounds normal. No respiratory distress. She has no wheezes. She has no rales.  Abdominal: Soft. Bowel sounds are normal. She exhibits no mass. There is no tenderness. There is no rebound and no guarding.  Musculoskeletal: Normal range of motion. She exhibits no edema.  Lymphadenopathy:    She has no cervical adenopathy.  Neurological: She is alert and oriented to person, place, and time. She exhibits normal muscle tone. Coordination normal.  Speech is clear and goal oriented Moves extremities without ataxia  Skin: Skin is warm and dry. No rash noted. She is not diaphoretic. No erythema.  Psychiatric: She has a normal mood and affect.    ED Course  Procedures (including critical care time) DIAGNOSTIC STUDIES: Oxygen Saturation is 100% on room air, normal by my interpretation.    COORDINATION OF CARE: At 830 PM Discussed treatment plan with patient which includes blood work, drug screen, UA, cosult to telepsych. Patient agrees.   Labs Reviewed  URINE RAPID DRUG SCREEN (HOSP PERFORMED) - Abnormal; Notable for the following:    Benzodiazepines POSITIVE (*)    Tetrahydrocannabinol POSITIVE (*)    All other components within normal limits  CBC - Abnormal;  Notable for the following:    Hemoglobin 15.5 (*)    All other components within normal limits  COMPREHENSIVE METABOLIC PANEL - Abnormal; Notable for the following:    GFR calc non Af Amer 83 (*)    All other components within normal limits  SALICYLATE LEVEL - Abnormal; Notable for the following:    Salicylate Lvl <2.0 (*)    All other components within normal limits  ACETAMINOPHEN LEVEL  ETHANOL  POCT PREGNANCY, URINE   No results found. 1. Anxiety   2. PTSD (post-traumatic stress disorder)     MDM  Juanda Crumble presents with IVC papers. Record review shows that patient was evaluated and discharged the resource this morning with outpatient resources. Stating her roommate take some IVC papers stating that she was taking people saying that she was a good overdose on Xanax. It is unclear whether or not he IVC  papers were taken out because of a miscommunication about patient's voluntary evaluation last night or whether or not roommate took IVC papers and anger. Patient adamantly denies SI/HI, auditory hallucinations/visual hallucinations.   Discussed with Dr. Rolan Bucco who recommends telepsych prior to discharge.  Patient has been medically cleared in the ED and is awaiting consult by Telepsyc for possible placement vs OP clinic information. Pt is currently not having SI or HI and appears stable in NAD. Pt is cleared to be moved back to [facility]   I personally performed the services described in this documentation, which was scribed in my presence. The recorded information has been reviewed and is accurate.     Dahlia Client Ciana Simmon, PA-C 10/16/12 [phone]

## 2012-10-15 NOTE — ED Notes (Signed)
Pt arrives via [employer] with IVC papers. Pt was just d/c early this morning for same. Pt came in voluntary yesterday and today, a friend took out IVC paperwork on her. [employer] officers at bedside. Pt was seen and treated yesterday for the same and she believes that friend was unaware that she had already sought tx for same. Pt has a follow-up referral already for [facility] from her visit yesterday. Pt denies SI and HI at this time.

## 2012-10-15 NOTE — ED Notes (Signed)
telepsych info called/faxed to lori at [facility]; she said there are two ahead of [place].

## 2012-10-16 NOTE — ED Notes (Signed)
telepsych completed 

## 2012-10-16 NOTE — ED Notes (Signed)
Per Mikle Bosworth at [facility] we have 3 ahead of [place].

## 2012-10-16 NOTE — ED Provider Notes (Signed)
Medical screening examination/treatment/procedure(s) were performed by non-physician practitioner and as supervising physician I was immediately available for consultation/collaboration.   Rolan Bucco, MD 10/16/12 [phone]

## 2012-10-16 NOTE — ED Notes (Signed)
Pt walked to d/c window. Given d/c instructions.

## 2012-10-16 NOTE — ED Provider Notes (Signed)
5:02 AM The patient was seen and evaluated by the psychiatrist Dr. Jacky Kindle, he feels as though the patient is not a threat to shoot himself or to others.  He reversed the involuntary commitment paperwork.  Outpatient followup recommended.  Resource guide given.  PCP followup.  Lyanne Co, MD 10/16/12 [phone]

## 2012-10-16 NOTE — ED Notes (Signed)
Pt is discharging home per recommendations of telepsych and IVC papers are rescinded.

## 2013-02-24 ENCOUNTER — Other Ambulatory Visit: Payer: Self-pay

## 2013-02-28 ENCOUNTER — Emergency Department (HOSPITAL_COMMUNITY)
Admission: EM | Admit: 2013-02-28 | Discharge: 2013-02-28 | Disposition: A | Discharge status: Home / Self Care | Payer: Self-pay | Attending: Emergency Medicine | Admitting: Emergency Medicine

## 2013-02-28 ENCOUNTER — Encounter (HOSPITAL_COMMUNITY): Payer: Self-pay | Admitting: Emergency Medicine

## 2013-02-28 DIAGNOSIS — F341 Dysthymic disorder: Secondary | ICD-10-CM | POA: Insufficient documentation

## 2013-02-28 DIAGNOSIS — F17200 Nicotine dependence, unspecified, uncomplicated: Secondary | ICD-10-CM | POA: Insufficient documentation

## 2013-02-28 DIAGNOSIS — M549 Dorsalgia, unspecified: Secondary | ICD-10-CM | POA: Insufficient documentation

## 2013-02-28 DIAGNOSIS — R112 Nausea with vomiting, unspecified: Secondary | ICD-10-CM | POA: Insufficient documentation

## 2013-02-28 DIAGNOSIS — R5383 Other fatigue: Secondary | ICD-10-CM | POA: Insufficient documentation

## 2013-02-28 DIAGNOSIS — R350 Frequency of micturition: Secondary | ICD-10-CM | POA: Insufficient documentation

## 2013-02-28 DIAGNOSIS — Z79899 Other long term (current) drug therapy: Secondary | ICD-10-CM | POA: Insufficient documentation

## 2013-02-28 DIAGNOSIS — R300 Dysuria: Secondary | ICD-10-CM | POA: Insufficient documentation

## 2013-02-28 DIAGNOSIS — J45909 Unspecified asthma, uncomplicated: Secondary | ICD-10-CM | POA: Insufficient documentation

## 2013-02-28 DIAGNOSIS — Z880 Allergy status to penicillin: Secondary | ICD-10-CM | POA: Insufficient documentation

## 2013-02-28 DIAGNOSIS — R5381 Other malaise: Secondary | ICD-10-CM | POA: Insufficient documentation

## 2013-02-28 DIAGNOSIS — A0472 Enterocolitis due to Clostridium difficile, not specified as recurrent: Secondary | ICD-10-CM

## 2013-02-28 DIAGNOSIS — F4310 Post-traumatic stress disorder, unspecified: Secondary | ICD-10-CM | POA: Insufficient documentation

## 2013-02-28 DIAGNOSIS — R630 Anorexia: Secondary | ICD-10-CM | POA: Insufficient documentation

## 2013-02-28 DIAGNOSIS — R197 Diarrhea, unspecified: Secondary | ICD-10-CM | POA: Insufficient documentation

## 2013-02-28 LAB — CBC WITH DIFFERENTIAL/PLATELET
Basophils Relative: 1 % (ref 0–1)
Eosinophils Absolute: 0.2 10*3/uL (ref 0.0–0.7)
MCH: 32.1 pg (ref 26.0–34.0)
MCHC: 35.6 g/dL (ref 30.0–36.0)
Neutrophils Relative %: 73 % (ref 43–77)
Platelets: 266 10*3/uL (ref 150–400)
RDW: 12.3 % (ref 11.5–15.5)

## 2013-02-28 LAB — URINALYSIS, ROUTINE W REFLEX MICROSCOPIC
Glucose, UA: NEGATIVE mg/dL
Hgb urine dipstick: NEGATIVE
Protein, ur: NEGATIVE mg/dL
Specific Gravity, Urine: 1.03 (ref 1.005–1.030)
pH: 6 (ref 5.0–8.0)

## 2013-02-28 LAB — COMPREHENSIVE METABOLIC PANEL
ALT: 15 U/L (ref 0–35)
Albumin: 4.2 g/dL (ref 3.5–5.2)
Alkaline Phosphatase: 70 U/L (ref 39–117)
BUN: 8 mg/dL (ref 6–23)
CO2: 26 mEq/L (ref 19–32)
Creatinine, Ser: 0.82 mg/dL (ref 0.50–1.10)
GFR calc Af Amer: >90 mL/min (ref >90)
GFR calc non Af Amer: >90 mL/min (ref >90)
Glucose, Bld: 90 mg/dL (ref 70–99)
Potassium: 3 mEq/L — ABNORMAL LOW (ref 3.5–5.1)
Total Protein: 7.2 g/dL (ref 6.0–8.3)

## 2013-02-28 LAB — HCG, SERUM, QUALITATIVE: Preg, Serum: NEGATIVE

## 2013-02-28 LAB — CK: Total CK: 74 U/L (ref 7–177)

## 2013-02-28 MED ORDER — DIPHENOXYLATE-ATROPINE 2.5-0.025 MG PO TABS: 1.0000 | ORAL_TABLET | Freq: Four times a day (QID) | ORAL | Status: DC | PRN

## 2013-02-28 MED ORDER — ONDANSETRON HCL 4 MG/2ML IJ SOLN
4.0000 mg | Freq: Once | INTRAMUSCULAR | Status: AC
  Administered 2013-02-28: 4 mg via INTRAVENOUS
  Filled 2013-02-28: qty 2

## 2013-02-28 MED ORDER — MORPHINE SULFATE 4 MG/ML IJ SOLN
4.0000 mg | INTRAMUSCULAR | Status: DC | PRN
  Administered 2013-02-28: 4 mg via INTRAVENOUS
  Filled 2013-02-28: qty 1

## 2013-02-28 MED ORDER — METRONIDAZOLE 500 MG PO TABS: 500.0000 mg | ORAL_TABLET | Freq: Two times a day (BID) | ORAL | Status: DC

## 2013-02-28 MED ORDER — SODIUM CHLORIDE 0.9 % IV SOLN
Freq: Once | INTRAVENOUS | Status: AC
  Administered 2013-02-28: 13:00:00 via INTRAVENOUS

## 2013-02-28 MED ORDER — SODIUM CHLORIDE 0.9 % IV BOLUS (SEPSIS)
1000.0000 mL | Freq: Once | INTRAVENOUS | Status: AC
  Administered 2013-02-28: 1000 mL via INTRAVENOUS

## 2013-02-28 MED ORDER — ONDANSETRON 4 MG PO TBDP: 4.0000 mg | ORAL_TABLET | Freq: Three times a day (TID) | ORAL | Status: DC | PRN

## 2013-02-28 NOTE — ED Provider Notes (Signed)
CSN: [id number]     Arrival date & time 02/28/13  1143 History   First MD Initiated Contact with Patient 02/28/13 1155     Chief Complaint  Patient presents with  . Flank Pain    HPI  Patient presents with back pain and vomiting. About 2 weeks ago she was admitted in [facility] with infection. She did not bite her right wrist. She was seen in urgent care there was Dermabonded, became became acutely infected. He was treated in the hospital for antibiotics for 2-3 days. Discharged on what sounds like Keflex and Bactrim. She continued to feel weak and poorly. She daily nausea. She had diarrhea "10 times today" for the first week out of the hospital this is actually decreased over the last several days. She states her urine has been dark. She has some poor by mouth intake anorexia nausea back pain.  Past Medical History  Diagnosis Date  . UTI (lower urinary tract infection)   . PTSD (post-traumatic stress disorder)     "adultified child"  . Anxiety and depression   . Abnormal Pap smear 2011  . Asthma    Past Surgical History  Procedure Laterality Date  . Thyroglossoduct cyst      x 2  . Wisdom tooth extraction     Family History  Problem Relation Age of Onset  . Depression Mother   . Anxiety disorder Mother   . Depression Father   . Hearing loss Paternal Uncle    History  Substance Use Topics  . Smoking status: Current Every Day Smoker -- 0.50 packs/day for .5 years    Types: Cigarettes  . Smokeless tobacco: Never Used  . Alcohol Use: Yes     Comment: social   OB History   Grav Para Term Preterm Abortions TAB SAB Ect Mult Living   0              Review of Systems  Constitutional: Positive for fatigue. Negative for fever, chills, diaphoresis and appetite change.  HENT: Negative for mouth sores, sore throat and trouble swallowing.   Eyes: Negative for visual disturbance.  Respiratory: Negative for cough, chest tightness, shortness of breath and wheezing.    Cardiovascular: Negative for chest pain.  Gastrointestinal: Positive for nausea, vomiting and diarrhea. Negative for abdominal pain and abdominal distention.  Endocrine: Negative for polydipsia, polyphagia and polyuria.  Genitourinary: Positive for dysuria and frequency. Negative for hematuria and decreased urine volume.       Dark urine  Musculoskeletal: Positive for back pain and myalgias. Negative for gait problem.  Skin: Negative for color change, pallor and rash.  Neurological: Negative for dizziness, syncope, light-headedness and headaches.  Hematological: Does not bruise/bleed easily.  Psychiatric/Behavioral: Negative for behavioral problems and confusion.    Allergies  Clindamycin/lincomycin; Penicillins; and Sulfa antibiotics  Home Medications   Current Outpatient Rx  Name  Route  Sig  Dispense  Refill  . albuterol (PROVENTIL HFA;VENTOLIN HFA) 108 (90 BASE) MCG/ACT inhaler   Inhalation   Inhale 2 puffs into the lungs every 6 (six) hours as needed. Wheezing         . amitriptyline (ELAVIL) 50 MG tablet   Oral   Take 50 mg by mouth at bedtime.         Marland Kitchen FLUoxetine (PROZAC) 20 MG capsule   Oral   Take 1 capsule (20 mg total) by mouth daily. For depression   30 capsule   0   . gabapentin (NEURONTIN) 300 MG  capsule   Oral   Take 1 capsule (300 mg total) by mouth 3 (three) times daily. For anxiety   90 capsule   0   . ibuprofen (ADVIL,MOTRIN) 200 MG tablet   Oral   Take 400 mg by mouth every 6 (six) hours as needed.         . prazosin (MINIPRESS) 1 MG capsule   Oral   Take 3 mg by mouth at bedtime. For night mares         . diphenoxylate-atropine (LOMOTIL) 2.5-0.025 MG per tablet   Oral   Take 1 tablet by mouth 4 (four) times daily as needed for diarrhea or loose stools.   30 tablet   0   . metroNIDAZOLE (FLAGYL) 500 MG tablet   Oral   Take 1 tablet (500 mg total) by mouth 2 (two) times daily.   14 tablet   0   . ondansetron (ZOFRAN ODT) 4 MG  disintegrating tablet   Oral   Take 1 tablet (4 mg total) by mouth every 8 (eight) hours as needed for nausea.   20 tablet   0    BP 114/62  Pulse 86  Temp(Src) 97.7 F (36.5 C) (Oral)  Resp 20  SpO2 100%  LMP 02/27/2013 Physical Exam  Constitutional: She is oriented to person, place, and time. She appears well-developed and well-nourished. No distress.  Awake alert. Appears to not feel well. Mentating well.  HENT:  Head: Normocephalic.  Eyes: Conjunctivae are normal. Pupils are equal, round, and reactive to light. No scleral icterus.  Neck: Normal range of motion. Neck supple. No thyromegaly present.  Cardiovascular: Normal rate and regular rhythm.  Exam reveals no gallop and no friction rub.   No murmur heard. Pulmonary/Chest: Effort normal and breath sounds normal. No respiratory distress. She has no wheezes. She has no rales.  Abdominal: Soft. Bowel sounds are normal. She exhibits no distension. There is no tenderness. There is no rebound.  Abdomen soft benign  Musculoskeletal: Normal range of motion.  Tenderness in the low to upper lumbar or spinal muscular.  Neurological: She is alert and oriented to person, place, and time.  Skin: Skin is warm and dry. No rash noted.  2 healing lacerations on the distal right forearm. Neurological intact distally normal tendon function distally. No sign of infection currently.  Psychiatric: She has a normal mood and affect. Her behavior is normal.    ED Course  Procedures (including critical care time) Labs Review Labs Reviewed  URINALYSIS, ROUTINE W REFLEX MICROSCOPIC - Abnormal; Notable for the following:    Color, Urine AMBER (*)    APPearance CLOUDY (*)    Bilirubin Urine SMALL (*)    Ketones, ur >80 (*)    All other components within normal limits  COMPREHENSIVE METABOLIC PANEL - Abnormal; Notable for the following:    Potassium 3.0 (*)    All other components within normal limits  URINE CULTURE  CBC WITH DIFFERENTIAL   HCG, SERUM, QUALITATIVE  CK   Imaging Review No results found.  EKG Interpretation   None       MDM   1. Diarrhea   2. C. difficile colitis      Plan will be hydration symptom control your evaluation for infection. With the back pain and dark urine I want to be sure that her CK is normal. Evaluate her liver function. She may have C. difficile with a recent antibiotic use use,  although this is improving.  CK  is normal. Urine does not appear infected. Her urine is concentrated. Her renal function is intact. With her history of the IV followed by by mouth antibiotics, I feel this is very likely a C. difficile colitis.  Her medical history he has decreased. She is hydrated here. With IV fluids and antiemetics he feels considerably better. Plan is discharge. We'll continue her probiotic. Flagyl. Zofran. Lomotil. Recheck if not improving.    Roney Marion, MD 02/28/13 [phone]

## 2013-02-28 NOTE — ED Notes (Signed)
Patient with decreased appetite, and not taking many fluids due to nausea.  Patient has vomited x 2 in the last 24 hours.  History of frequent UTI's.  She is having bilateral flank pain, and dysuria, and decreased urine output.

## 2013-02-28 NOTE — Progress Notes (Signed)
[facility] provided pt with a list of primary care resources. Patient pending Medicaid.  °

## 2013-03-01 LAB — URINE CULTURE

## 2014-03-25 IMAGING — CR DG CHEST 2V
2 series · 2 of 2 positions shown · non-contrast
Comparison: None.

CLINICAL DATA: panic attacks.  Chest pain.

CHEST - 2 VIEW

[PA]
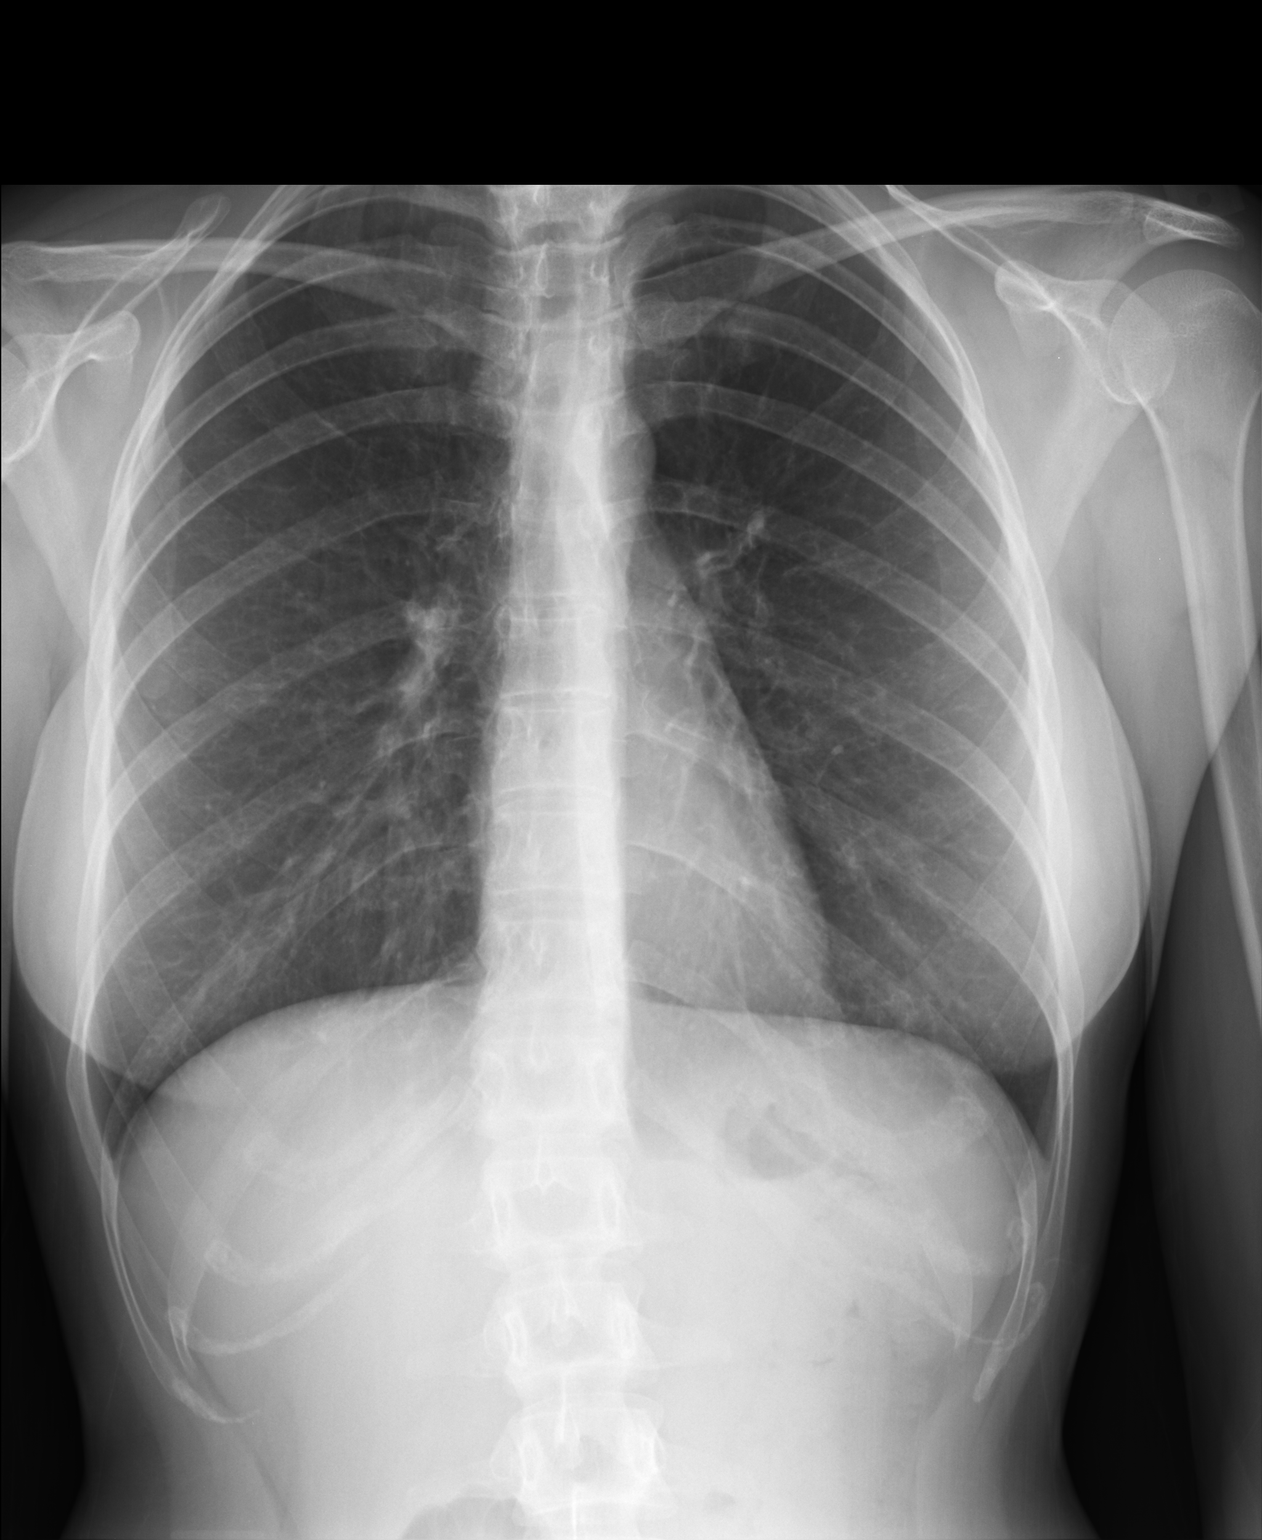

[lateral]
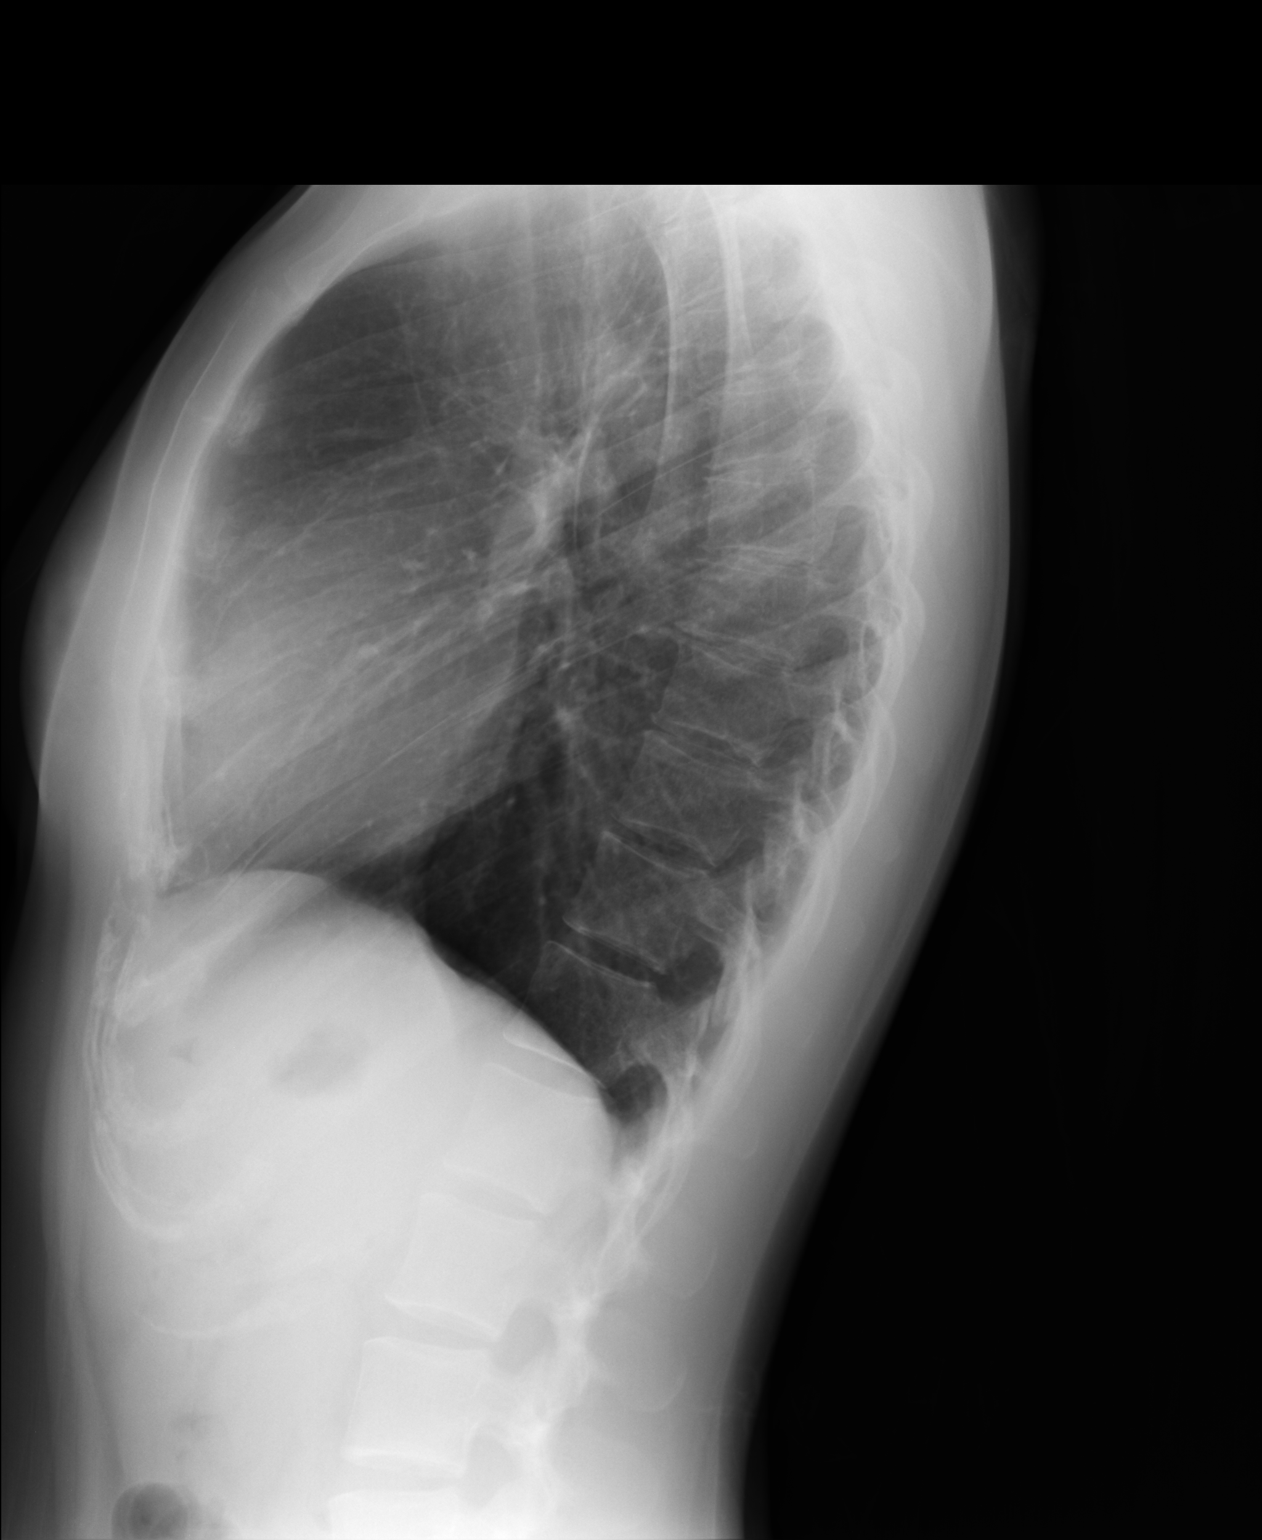

[2 of 2 positions shown; findings below may reference images not displayed]

FINDINGS: The heart, mediastinum and hila are within normal limits.
The lungs are hyperexpanded but clear.  No pleural effusion or
pneumothorax.  The bony thorax and surrounding soft tissues are
unremarkable.
IMPRESSION: No active disease of the chest.

## 2014-08-01 ENCOUNTER — Other Ambulatory Visit: Payer: Self-pay | Admitting: Obstetrics and Gynecology

## 2014-08-01 DIAGNOSIS — R102 Pelvic and perineal pain: Secondary | ICD-10-CM

## 2014-08-01 DIAGNOSIS — R1084 Generalized abdominal pain: Secondary | ICD-10-CM

## 2014-08-03 ENCOUNTER — Ambulatory Visit
Admission: RE | Admit: 2014-08-03 | Discharge: 2014-08-03 | Disposition: A | Discharge status: Home / Self Care | Payer: BLUE CROSS/BLUE SHIELD | Source: Ambulatory Visit | Attending: Obstetrics and Gynecology | Admitting: Obstetrics and Gynecology

## 2014-08-03 DIAGNOSIS — R102 Pelvic and perineal pain: Secondary | ICD-10-CM

## 2014-08-03 DIAGNOSIS — R1084 Generalized abdominal pain: Secondary | ICD-10-CM

## 2014-08-03 MED ORDER — IOPAMIDOL (ISOVUE-300) INJECTION 61%
100.0000 mL | Freq: Once | INTRAVENOUS | Status: AC | PRN
  Administered 2014-08-03: 100 mL via INTRAVENOUS

## 2014-11-06 ENCOUNTER — Emergency Department (HOSPITAL_COMMUNITY)
Admission: EM | Admit: 2014-11-06 | Discharge: 2014-11-06 | Discharge status: Against Medical Advice | Payer: BLUE CROSS/BLUE SHIELD | Attending: Emergency Medicine | Admitting: Emergency Medicine

## 2014-11-06 ENCOUNTER — Encounter (HOSPITAL_COMMUNITY): Payer: Self-pay | Admitting: Emergency Medicine

## 2014-11-06 DIAGNOSIS — R109 Unspecified abdominal pain: Secondary | ICD-10-CM | POA: Diagnosis present

## 2014-11-06 DIAGNOSIS — R1110 Vomiting, unspecified: Secondary | ICD-10-CM | POA: Insufficient documentation

## 2014-11-06 DIAGNOSIS — Z720 Tobacco use: Secondary | ICD-10-CM | POA: Diagnosis not present

## 2014-11-06 DIAGNOSIS — J45909 Unspecified asthma, uncomplicated: Secondary | ICD-10-CM | POA: Diagnosis not present

## 2014-11-06 HISTORY — DX: Endometriosis, unspecified: N80.9

## 2014-11-06 LAB — CBC
HEMATOCRIT: 44.6 % (ref 36.0–46.0)
HEMOGLOBIN: 15 g/dL (ref 12.0–15.0)
MCH: 31.1 pg (ref 26.0–34.0)
MCHC: 33.6 g/dL (ref 30.0–36.0)
MCV: 92.5 fL (ref 78.0–100.0)
Platelets: 325 10*3/uL (ref 150–400)
RBC: 4.82 MIL/uL (ref 3.87–5.11)
RDW: 12.3 % (ref 11.5–15.5)
WBC: 11.6 10*3/uL — AB (ref 4.0–10.5)

## 2014-11-06 LAB — COMPREHENSIVE METABOLIC PANEL
ALT: 11 U/L — ABNORMAL LOW (ref 14–54)
AST: 21 U/L (ref 15–41)
Albumin: 4.2 g/dL (ref 3.5–5.0)
Alkaline Phosphatase: 44 U/L (ref 38–126)
Anion gap: 8 (ref 5–15)
BUN: 7 mg/dL (ref 6–20)
CALCIUM: 9.2 mg/dL (ref 8.9–10.3)
CHLORIDE: 105 mmol/L (ref 101–111)
CO2: 26 mmol/L (ref 22–32)
Creatinine, Ser: 0.88 mg/dL (ref 0.44–1.00)
GFR calc Af Amer: >60 mL/min (ref >60)
GLUCOSE: 109 mg/dL — AB (ref 65–99)
Potassium: 3.5 mmol/L (ref 3.5–5.1)
SODIUM: 139 mmol/L (ref 135–145)
TOTAL PROTEIN: 7.6 g/dL (ref 6.5–8.1)
Total Bilirubin: 0.7 mg/dL (ref 0.3–1.2)

## 2014-11-06 LAB — I-STAT BETA HCG BLOOD, ED (MC, WL, AP ONLY)

## 2014-11-06 LAB — LIPASE, BLOOD: Lipase: 30 U/L (ref 22–51)

## 2014-11-06 NOTE — ED Notes (Signed)
Pt no answer from lobby  

## 2014-11-06 NOTE — ED Notes (Signed)
No answer from lobby  

## 2014-11-06 NOTE — ED Notes (Signed)
Pt states that she has endometriosis and is scheduled to see a specialist later this week.  Pt states that she started new birth control and is coming to the end of her cycle to have a period.  Pt states that today she was sent home from work due to pain in her abd and vomiting.

## 2014-11-17 ENCOUNTER — Encounter (HOSPITAL_COMMUNITY): Payer: Self-pay | Admitting: *Deleted

## 2014-11-17 ENCOUNTER — Other Ambulatory Visit: Payer: Self-pay | Admitting: Obstetrics and Gynecology

## 2014-11-25 ENCOUNTER — Other Ambulatory Visit (HOSPITAL_COMMUNITY): Payer: Self-pay | Admitting: Obstetrics and Gynecology

## 2014-11-25 NOTE — H&P (Signed)
Pamela Gallagher is a 29 y.o.  female G: 0 who presents for exploratory laparoscopy because of chronic pelvic pain.  Over the past year the patient has experienced severe menstrual cramping that has  gotten progressively worse.  Over the past four months, however, her pain has become daily, crampy and stabbing in nature and rated at 7/10 on a 10 point pain scale.  At night her pain may increase to 10/10 on a 10 point pain scale with the same severity  during menses.  She finds no relief with Ibuprofen and even with a 3 month trial of oral contraceptives, her pain not only increased but she bled almost continuously while she was on them.  Her typical period lasts for 8 days with a pad changed every hour and cramping,  as previously described.  After failing management with oral contraceptives the patient was placed on norethindrone 5 mg daily and  Vicodin which  significantly  decreased her pain and stopped her bleeding   She admits to dyspareunia,  on occasion and lower back pain but no changes in bowel or bladder function (except she experiences urinary hesitancy during the time of her menstrual flow).  Her TSH, CBC and CMET were in normal range and gonorrhea/chlamydia tests negative in June 2016.  A pelvic ultrasound in April 2016 showed a uterus: 5.13 x 4.85 x 4.56 cm, endometrium: 5.67 mm and normal appearing ovaries.  A review of both medical and surgical management options we're given to the patient and she has chosen to proceed with surgical evaluation due to the  persistent and disruptive nature of her symptoms.   Past Medical History  OB History: G: 0  GYN History: menarche:  29 YO;     LMP:  see HPI    Contracepton: Condoms;  Denies history of STDs;  Has a history of abnormal PAP smear in 2011 but was repeated and has been normal since;   Last PAP smear: ASCUS with negative HPV  Medical History: Asthma  Surgical History: 2011 Removal of Thyroid Cyst Denies problems with anesthesia or history of  blood transfusions  Family History: Diabetes Mellitus, Hypercholesterolemia, Hypertension and Cardiovascular Disease  Social History: Single and employed as Building surveyor:  Smokes 2 cigarettes/day and rare alcohol intake   Outpatient Encounter Prescriptions as of 11/25/2014  Medication Sig  . albuterol (PROVENTIL HFA;VENTOLIN HFA) 108 (90 BASE) MCG/ACT inhaler Inhale 2 puffs into the lungs every 6 (six) hours as needed. Wheezing (Patient taking differently: Inhale 2 puffs into the lungs every 6 (six) hours as needed for shortness of breath. Wheezing)  . HYDROcodone-acetaminophen (NORCO/VICODIN) 5-325 MG per tablet Take 0.5-1 tablets by mouth every 6 (six) hours as needed for moderate pain. Depends on pain if patient takes 0.5 or 1 tablet.  Marland Kitchen ibuprofen (ADVIL,MOTRIN) 800 MG tablet Take 800 mg by mouth every 8 (eight) hours as needed for moderate pain.  Marland Kitchen norethindrone (AYGESTIN) 5 MG tablet Take 5 mg by mouth daily.  Marland Kitchen OVER THE COUNTER MEDICATION Take 1 tablet by mouth daily as needed (heartburn). Generic over the counter medication for heartburn.   No facility-administered encounter medications on file as of 11/25/2014.  Flexeril  5 mg  3 times a day as needed  Allergies  Allergen Reactions  . Clindamycin/Lincomycin Shortness Of Breath  . Penicillins Anaphylaxis  . Sulfa Antibiotics Anaphylaxis    Denies sensitivity to peanuts, shellfish, soy, latex or adhesives.   ROS: Admits to shortness of breath with asthma  flare, nausea attributed to pain medications and nocturnal hand swelling but denies corrective lenses,  headache, vision changes, nasal congestion, dysphagia, tinnitus, dizziness, hoarseness, cough,  chest pain, shortness of breath, nausea, vomiting, diarrhea,constipation,  urinary frequency, urgency  dysuria, hematuria, vaginitis symptoms, pelvic pain, swelling of joints,easy bruising,  myalgias, arthralgias, skin rashes, unexplained weight loss and except as is  mentioned in the history of present illness, patient's review of systems is otherwise negative.   Physical Exam  Bp: 90/60  P: 84   R: 20   Temperature: 98.8 degrees F orally  Weight: 126 lbs.  Height: 5'4"   BMI: 21.6  Neck: supple without masses or thyromegaly Lungs: clear to auscultation Heart: regular rate and rhythm Abdomen: soft,  diffusely tender in both lower quadrants with voluntary guarding and no organomegaly Pelvic:EGBUS- wnl; vagina-normal rugae with brown discharge; uterus-normal size but tender, cervix without lesions or motion tenderness; adnexae-bilateral  tenderness but no masses Extremities:  no clubbing, cyanosis or edema   Assesment: Chronic Pelvic Pain   Disposition:  A discussion was held with patient regarding the indication for her procedure(s) along with the risks, which include but are not limited to: reaction to anesthesia, damage to adjacent organs, infection and excessive bleeding. The patient verbalized understanding of these risks and has consented to proceed with an Exploratory Laparoscopy at [facility] [place] on November 27, 2014.   CSN# [id number]   Cameryn Chrisley J. Lowell Guitar, PA-C  for Dr. Woodroe Mode. Su Hilt  Agree with above.  Will do Dx Laparoscopy +/- chromopertubation.

## 2014-11-27 ENCOUNTER — Encounter (HOSPITAL_COMMUNITY)
Admission: RE | Disposition: A | Discharge status: Home / Self Care | Payer: Self-pay | Source: Ambulatory Visit | Attending: Obstetrics and Gynecology

## 2014-11-27 ENCOUNTER — Ambulatory Visit (HOSPITAL_COMMUNITY): Payer: BLUE CROSS/BLUE SHIELD | Admitting: Anesthesiology

## 2014-11-27 ENCOUNTER — Ambulatory Visit (HOSPITAL_COMMUNITY)
Admission: RE | Admit: 2014-11-27 | Discharge: 2014-11-27 | Disposition: A | Discharge status: Home / Self Care | Payer: BLUE CROSS/BLUE SHIELD | Source: Ambulatory Visit | Attending: Obstetrics and Gynecology | Admitting: Obstetrics and Gynecology

## 2014-11-27 ENCOUNTER — Encounter (HOSPITAL_COMMUNITY): Payer: Self-pay | Admitting: Emergency Medicine

## 2014-11-27 DIAGNOSIS — G8929 Other chronic pain: Secondary | ICD-10-CM | POA: Insufficient documentation

## 2014-11-27 DIAGNOSIS — N941 Dyspareunia: Secondary | ICD-10-CM | POA: Insufficient documentation

## 2014-11-27 DIAGNOSIS — Z880 Allergy status to penicillin: Secondary | ICD-10-CM | POA: Insufficient documentation

## 2014-11-27 DIAGNOSIS — F17210 Nicotine dependence, cigarettes, uncomplicated: Secondary | ICD-10-CM | POA: Diagnosis not present

## 2014-11-27 DIAGNOSIS — N803 Endometriosis of pelvic peritoneum: Secondary | ICD-10-CM | POA: Insufficient documentation

## 2014-11-27 DIAGNOSIS — R102 Pelvic and perineal pain: Secondary | ICD-10-CM | POA: Insufficient documentation

## 2014-11-27 HISTORY — PX: LAPAROSCOPY: SHX197

## 2014-11-27 HISTORY — PX: BIOPSY: SHX5522

## 2014-11-27 HISTORY — DX: Nausea with vomiting, unspecified: R11.2

## 2014-11-27 HISTORY — PX: CHROMOPERTUBATION: SHX6288

## 2014-11-27 HISTORY — DX: Other specified postprocedural states: Z98.890

## 2014-11-27 LAB — CBC
HEMATOCRIT: 41.6 % (ref 36.0–46.0)
Hemoglobin: 14 g/dL (ref 12.0–15.0)
MCH: 31.2 pg (ref 26.0–34.0)
MCHC: 33.7 g/dL (ref 30.0–36.0)
MCV: 92.7 fL (ref 78.0–100.0)
Platelets: 285 10*3/uL (ref 150–400)
RBC: 4.49 MIL/uL (ref 3.87–5.11)
RDW: 12.4 % (ref 11.5–15.5)
WBC: 8.3 10*3/uL (ref 4.0–10.5)

## 2014-11-27 LAB — PREGNANCY, URINE: Preg Test, Ur: NEGATIVE

## 2014-11-27 SURGERY — LAPAROSCOPY OPERATIVE
Anesthesia: General

## 2014-11-27 MED ORDER — ROCURONIUM BROMIDE 100 MG/10ML IV SOLN
INTRAVENOUS | Status: DC | PRN
  Administered 2014-11-27: 5 mg via INTRAVENOUS
  Administered 2014-11-27: 10 mg via INTRAVENOUS
  Administered 2014-11-27: 15 mg via INTRAVENOUS

## 2014-11-27 MED ORDER — SCOPOLAMINE 1 MG/3DAYS TD PT72
1.0000 | MEDICATED_PATCH | Freq: Once | TRANSDERMAL | Status: DC
  Administered 2014-11-27: 1.5 mg via TRANSDERMAL

## 2014-11-27 MED ORDER — SCOPOLAMINE 1 MG/3DAYS TD PT72
MEDICATED_PATCH | TRANSDERMAL | Status: AC
  Administered 2014-11-27: 1.5 mg via TRANSDERMAL
  Filled 2014-11-27: qty 1

## 2014-11-27 MED ORDER — IBUPROFEN 800 MG PO TABS: ORAL_TABLET | ORAL | Status: DC

## 2014-11-27 MED ORDER — ROCURONIUM BROMIDE 100 MG/10ML IV SOLN
INTRAVENOUS | Status: AC
Start: 2014-11-27 — End: 2014-11-27
  Filled 2014-11-27: qty 1

## 2014-11-27 MED ORDER — OXYCODONE-ACETAMINOPHEN 5-325 MG PO TABS: 1.0000 | ORAL_TABLET | Freq: Four times a day (QID) | ORAL | Status: DC | PRN

## 2014-11-27 MED ORDER — BUPIVACAINE HCL (PF) 0.25 % IJ SOLN
INTRAMUSCULAR | Status: DC | PRN
  Administered 2014-11-27: 10 mL

## 2014-11-27 MED ORDER — DEXAMETHASONE SODIUM PHOSPHATE 4 MG/ML IJ SOLN
INTRAMUSCULAR | Status: AC
  Filled 2014-11-27: qty 1

## 2014-11-27 MED ORDER — FENTANYL CITRATE (PF) 250 MCG/5ML IJ SOLN
INTRAMUSCULAR | Status: AC
  Filled 2014-11-27: qty 25

## 2014-11-27 MED ORDER — MIDAZOLAM HCL 2 MG/2ML IJ SOLN
INTRAMUSCULAR | Status: AC
  Filled 2014-11-27: qty 4

## 2014-11-27 MED ORDER — HYDROMORPHONE HCL 1 MG/ML IJ SOLN
INTRAMUSCULAR | Status: DC | PRN
  Administered 2014-11-27: 1 mg via INTRAVENOUS

## 2014-11-27 MED ORDER — GLYCOPYRROLATE 0.2 MG/ML IJ SOLN
INTRAMUSCULAR | Status: AC
  Filled 2014-11-27: qty 2

## 2014-11-27 MED ORDER — FENTANYL CITRATE (PF) 100 MCG/2ML IJ SOLN
INTRAMUSCULAR | Status: DC | PRN
  Administered 2014-11-27: 50 ug via INTRAVENOUS
  Administered 2014-11-27: 25 ug via INTRAVENOUS
  Administered 2014-11-27: 50 ug via INTRAVENOUS
  Administered 2014-11-27: 100 ug via INTRAVENOUS
  Administered 2014-11-27: 25 ug via INTRAVENOUS

## 2014-11-27 MED ORDER — PROPOFOL 10 MG/ML IV BOLUS
INTRAVENOUS | Status: DC | PRN
  Administered 2014-11-27: 150 mg via INTRAVENOUS
  Administered 2014-11-27: 30 mg via INTRAVENOUS

## 2014-11-27 MED ORDER — NEOSTIGMINE METHYLSULFATE 10 MG/10ML IV SOLN
INTRAVENOUS | Status: AC
  Filled 2014-11-27: qty 1

## 2014-11-27 MED ORDER — PROMETHAZINE HCL 25 MG/ML IJ SOLN
6.2500 mg | INTRAMUSCULAR | Status: DC | PRN
Start: 2014-11-27 — End: 2014-11-27

## 2014-11-27 MED ORDER — OXYCODONE-ACETAMINOPHEN 5-325 MG PO TABS
1.0000 | ORAL_TABLET | ORAL | Status: DC | PRN
  Administered 2014-11-27: 1 via ORAL

## 2014-11-27 MED ORDER — LIDOCAINE HCL (CARDIAC) 20 MG/ML IV SOLN
INTRAVENOUS | Status: AC
  Filled 2014-11-27: qty 5

## 2014-11-27 MED ORDER — NEOSTIGMINE METHYLSULFATE 10 MG/10ML IV SOLN
INTRAVENOUS | Status: DC | PRN
  Administered 2014-11-27: 2 mg via INTRAVENOUS

## 2014-11-27 MED ORDER — DEXAMETHASONE SODIUM PHOSPHATE 10 MG/ML IJ SOLN
INTRAMUSCULAR | Status: DC | PRN
  Administered 2014-11-27: 4 mg via INTRAVENOUS

## 2014-11-27 MED ORDER — KETOROLAC TROMETHAMINE 30 MG/ML IJ SOLN
INTRAMUSCULAR | Status: AC
  Filled 2014-11-27: qty 1

## 2014-11-27 MED ORDER — IBUPROFEN 600 MG PO TABS: ORAL_TABLET | ORAL | Status: DC

## 2014-11-27 MED ORDER — ONDANSETRON HCL 4 MG/2ML IJ SOLN
INTRAMUSCULAR | Status: DC | PRN
  Administered 2014-11-27: 4 mg via INTRAVENOUS

## 2014-11-27 MED ORDER — KETOROLAC TROMETHAMINE 30 MG/ML IJ SOLN
INTRAMUSCULAR | Status: DC | PRN
  Administered 2014-11-27: 30 mg via INTRAVENOUS

## 2014-11-27 MED ORDER — HYDROMORPHONE HCL 1 MG/ML IJ SOLN
INTRAMUSCULAR | Status: AC
  Filled 2014-11-27: qty 1

## 2014-11-27 MED ORDER — METHYLENE BLUE 1 % INJ SOLN
INTRAMUSCULAR | Status: DC | PRN
  Administered 2014-11-27: 1 mL

## 2014-11-27 MED ORDER — ACETAMINOPHEN 10 MG/ML IV SOLN
1000.0000 mg | Freq: Once | INTRAVENOUS | Status: AC
  Administered 2014-11-27: 1000 mg via INTRAVENOUS
  Filled 2014-11-27: qty 100

## 2014-11-27 MED ORDER — FENTANYL CITRATE (PF) 100 MCG/2ML IJ SOLN
INTRAMUSCULAR | Status: AC
  Filled 2014-11-27: qty 2

## 2014-11-27 MED ORDER — LIDOCAINE HCL (CARDIAC) 20 MG/ML IV SOLN
INTRAVENOUS | Status: DC | PRN
  Administered 2014-11-27: 100 mg via INTRAVENOUS

## 2014-11-27 MED ORDER — FENTANYL CITRATE (PF) 100 MCG/2ML IJ SOLN
25.0000 ug | INTRAMUSCULAR | Status: DC | PRN
  Administered 2014-11-27 (×2): 50 ug via INTRAVENOUS

## 2014-11-27 MED ORDER — LACTATED RINGERS IV SOLN
INTRAVENOUS | Status: DC
  Administered 2014-11-27 (×2): via INTRAVENOUS

## 2014-11-27 MED ORDER — OXYCODONE-ACETAMINOPHEN 5-325 MG PO TABS
ORAL_TABLET | ORAL | Status: DC
Start: 2014-11-27 — End: 2014-11-27
  Filled 2014-11-27: qty 1

## 2014-11-27 MED ORDER — PROPOFOL 10 MG/ML IV BOLUS
INTRAVENOUS | Status: AC
  Filled 2014-11-27: qty 20

## 2014-11-27 MED ORDER — METHYLENE BLUE 1 % INJ SOLN
INTRAMUSCULAR | Status: AC
Start: 2014-11-27 — End: 2014-11-27
  Filled 2014-11-27: qty 1

## 2014-11-27 MED ORDER — ONDANSETRON HCL 4 MG/2ML IJ SOLN
INTRAMUSCULAR | Status: AC
  Filled 2014-11-27: qty 2

## 2014-11-27 MED ORDER — MEPERIDINE HCL 25 MG/ML IJ SOLN: 6.2500 mg | INTRAMUSCULAR | Status: DC | PRN

## 2014-11-27 MED ORDER — SODIUM CHLORIDE 0.9 % IR SOLN
Status: DC | PRN
  Administered 2014-11-27: 1000 mL

## 2014-11-27 MED ORDER — GLYCOPYRROLATE 0.2 MG/ML IJ SOLN
INTRAMUSCULAR | Status: DC | PRN
  Administered 2014-11-27: 0.4 mg via INTRAVENOUS

## 2014-11-27 MED ORDER — BUPIVACAINE HCL (PF) 0.25 % IJ SOLN
INTRAMUSCULAR | Status: AC
Start: 2014-11-27 — End: 2014-11-27
  Filled 2014-11-27: qty 30

## 2014-11-27 SURGICAL SUPPLY — 36 items
CABLE HIGH FREQUENCY MONO STRZ (ELECTRODE) IMPLANT
CHLORAPREP W/TINT 26ML (MISCELLANEOUS) ×3 IMPLANT
CLOTH BEACON ORANGE TIMEOUT ST (SAFETY) ×3 IMPLANT
DRSG COVADERM PLUS 2X2 (GAUZE/BANDAGES/DRESSINGS) ×6 IMPLANT
DRSG OPSITE POSTOP 3X4 (GAUZE/BANDAGES/DRESSINGS) ×3 IMPLANT
DRSG TELFA 3X8 NADH (GAUZE/BANDAGES/DRESSINGS) ×3 IMPLANT
FORCEPS CUTTING 33CM 5MM (CUTTING FORCEPS) IMPLANT
FORCEPS CUTTING 45CM 5MM (CUTTING FORCEPS) IMPLANT
GLOVE BIO SURGEON STRL SZ7.5 (GLOVE) ×6 IMPLANT
GLOVE BIOGEL PI IND STRL 7.5 (GLOVE) ×4 IMPLANT
GLOVE BIOGEL PI INDICATOR 7.5 (GLOVE) ×2
GLOVE ECLIPSE 7.0 STRL STRAW (GLOVE) ×9 IMPLANT
GOWN STRL REUS W/TWL LRG LVL3 (GOWN DISPOSABLE) ×6 IMPLANT
LIQUID BAND (GAUZE/BANDAGES/DRESSINGS) ×3 IMPLANT
MANIPULATOR UTERINE 4.5 ZUMI (MISCELLANEOUS) ×3 IMPLANT
NEEDLE INSUFFLATION 120MM (ENDOMECHANICALS) ×3 IMPLANT
NS IRRIG 1000ML POUR BTL (IV SOLUTION) ×6 IMPLANT
PACK LAPAROSCOPY BASIN (CUSTOM PROCEDURE TRAY) ×3 IMPLANT
PAD POSITIONER PINK NONSTERILE (MISCELLANEOUS) ×3 IMPLANT
POUCH SPECIMEN RETRIEVAL 10MM (ENDOMECHANICALS) IMPLANT
SET IRRIG TUBING LAPAROSCOPIC (IRRIGATION / IRRIGATOR) IMPLANT
SLEEVE XCEL OPT CAN 5 100 (ENDOMECHANICALS) IMPLANT
SOLUTION ELECTROLUBE (MISCELLANEOUS) ×3 IMPLANT
SUT MNCRL AB 3-0 PS2 27 (SUTURE) ×3 IMPLANT
SUT VIC AB 2-0 SH 27 (SUTURE) ×1
SUT VIC AB 2-0 SH 27XBRD (SUTURE) ×2 IMPLANT
SUT VICRYL 0 ENDOLOOP (SUTURE) IMPLANT
SUT VICRYL 0 TIES 12 18 (SUTURE) IMPLANT
SUT VICRYL 0 UR6 27IN ABS (SUTURE) ×3 IMPLANT
SYR 50ML LL SCALE MARK (SYRINGE) ×6 IMPLANT
TOWEL OR 17X24 6PK STRL BLUE (TOWEL DISPOSABLE) ×6 IMPLANT
TRAY FOLEY CATH SILVER 14FR (SET/KITS/TRAYS/PACK) ×3 IMPLANT
TROCAR XCEL NON-BLD 11X100MML (ENDOMECHANICALS) ×3 IMPLANT
TROCAR XCEL NON-BLD 5MMX100MML (ENDOMECHANICALS) ×3 IMPLANT
WARMER LAPAROSCOPE (MISCELLANEOUS) ×3 IMPLANT
WATER STERILE IRR 1000ML POUR (IV SOLUTION) ×3 IMPLANT

## 2014-11-27 NOTE — Anesthesia Preprocedure Evaluation (Addendum)
Anesthesia Evaluation  Patient identified by MRN, date of birth, ID band Patient awake    Reviewed: Allergy & Precautions, Patient's Chart, lab work & pertinent test results  Airway Mallampati: II   Neck ROM: Full    Dental  (+) Teeth Intact, Dental Advisory Given   Pulmonary asthma (inhalers) , Current Smoker (2 cigs/day?), former smoker,  breath sounds clear to auscultation        Cardiovascular negative cardio ROS  Rhythm:Regular  EKG 2014 WNL   Neuro/Psych PTSD   GI/Hepatic negative GI ROS,   Endo/Other  negative endocrine ROS  Renal/GU negative Renal ROS     Musculoskeletal   Abdominal (+)  Abdomen: soft.    Peds  Hematology 15/44   11/06/2014   Anesthesia Other Findings   Reproductive/Obstetrics                           Anesthesia Physical Anesthesia Plan  ASA: II  Anesthesia Plan: General   Post-op Pain Management:    Induction:   Airway Management Planned: Oral ETT  Additional Equipment:   Intra-op Plan:   Post-operative Plan: Extubation in OR  Informed Consent: I have reviewed the patients History and Physical, chart, labs and discussed the procedure including the risks, benefits and alternatives for the proposed anesthesia with the patient or authorized representative who has indicated his/her understanding and acceptance.     Plan Discussed with:   Anesthesia Plan Comments:         Anesthesia Quick Evaluation

## 2014-11-27 NOTE — Anesthesia Procedure Notes (Signed)
Procedure Name: Intubation Date/Time: 11/27/2014 2:09 PM Performed by: Cephus Shelling A Pre-anesthesia Checklist: Patient identified, Patient being monitored, Timeout performed, Emergency Drugs available and Suction available Patient Re-evaluated:Patient Re-evaluated prior to inductionOxygen Delivery Method: Circle System Utilized Preoxygenation: Pre-oxygenation with 100% oxygen Intubation Type: IV induction Ventilation: Mask ventilation without difficulty Laryngoscope Size: Mac and 3 Grade View: Grade II Tube type: Oral Tube size: 7.0 mm Number of attempts: 1 Airway Equipment and Method: stylet Placement Confirmation: ETT inserted through vocal cords under direct vision,  positive ETCO2 and breath sounds checked- equal and bilateral Secured at: 20 cm Tube secured with: Tape Dental Injury: Teeth and Oropharynx as per pre-operative assessment

## 2014-11-27 NOTE — H&P (View-Only) (Signed)
Pamela Gallagher is a 29 y.o.  female G: 0 who presents for exploratory laparoscopy because of chronic pelvic pain.  Over the past year the patient has experienced severe menstrual cramping that has  gotten progressively worse.  Over the past four months, however, her pain has become daily, crampy and stabbing in nature and rated at 7/10 on a 10 point pain scale.  At night her pain may increase to 10/10 on a 10 point pain scale with the same severity  during menses.  She finds no relief with Ibuprofen and even with a 3 month trial of oral contraceptives, her pain not only increased but she bled almost continuously while she was on them.  Her typical period lasts for 8 days with a pad changed every hour and cramping,  as previously described.  After failing management with oral contraceptives the patient was placed on norethindrone 5 mg daily and  Vicodin which  significantly  decreased her pain and stopped her bleeding   She admits to dyspareunia,  on occasion and lower back pain but no changes in bowel or bladder function (except she experiences urinary hesitancy during the time of her menstrual flow).  Her TSH, CBC and CMET were in normal range and gonorrhea/chlamydia tests negative in June 2016.  A pelvic ultrasound in April 2016 showed a uterus: 5.13 x 4.85 x 4.56 cm, endometrium: 5.67 mm and normal appearing ovaries.  A review of both medical and surgical management options we're given to the patient and she has chosen to proceed with surgical evaluation due to the  persistent and disruptive nature of her symptoms.   Past Medical History  OB History: G: 0  GYN History: menarche:  29 YO;     LMP:  see HPI    Contracepton: Condoms;  Denies history of STDs;  Has a history of abnormal PAP smear in 2011 but was repeated and has been normal since;   Last PAP smear: ASCUS with negative HPV  Medical History: Asthma  Surgical History: 2011 Removal of Thyroid Cyst Denies problems with anesthesia or history of  blood transfusions  Family History: Diabetes Mellitus, Hypercholesterolemia, Hypertension and Cardiovascular Disease  Social History: Single and employed as Building surveyor:  Smokes 2 cigarettes/day and rare alcohol intake   Outpatient Encounter Prescriptions as of 11/25/2014  Medication Sig  . albuterol (PROVENTIL HFA;VENTOLIN HFA) 108 (90 BASE) MCG/ACT inhaler Inhale 2 puffs into the lungs every 6 (six) hours as needed. Wheezing (Patient taking differently: Inhale 2 puffs into the lungs every 6 (six) hours as needed for shortness of breath. Wheezing)  . HYDROcodone-acetaminophen (NORCO/VICODIN) 5-325 MG per tablet Take 0.5-1 tablets by mouth every 6 (six) hours as needed for moderate pain. Depends on pain if patient takes 0.5 or 1 tablet.  Marland Kitchen ibuprofen (ADVIL,MOTRIN) 800 MG tablet Take 800 mg by mouth every 8 (eight) hours as needed for moderate pain.  Marland Kitchen norethindrone (AYGESTIN) 5 MG tablet Take 5 mg by mouth daily.  Marland Kitchen OVER THE COUNTER MEDICATION Take 1 tablet by mouth daily as needed (heartburn). Generic over the counter medication for heartburn.   No facility-administered encounter medications on file as of 11/25/2014.  Flexeril  5 mg  3 times a day as needed  Allergies  Allergen Reactions  . Clindamycin/Lincomycin Shortness Of Breath  . Penicillins Anaphylaxis  . Sulfa Antibiotics Anaphylaxis    Denies sensitivity to peanuts, shellfish, soy, latex or adhesives.   ROS: Admits to shortness of breath with asthma  flare, nausea attributed to pain medications and nocturnal hand swelling but denies corrective lenses,  headache, vision changes, nasal congestion, dysphagia, tinnitus, dizziness, hoarseness, cough,  chest pain, shortness of breath, nausea, vomiting, diarrhea,constipation,  urinary frequency, urgency  dysuria, hematuria, vaginitis symptoms, pelvic pain, swelling of joints,easy bruising,  myalgias, arthralgias, skin rashes, unexplained weight loss and except as is  mentioned in the history of present illness, patient's review of systems is otherwise negative.   Physical Exam  Bp: 90/60  P: 84   R: 20   Temperature: 98.8 degrees F orally  Weight: 126 lbs.  Height: 5'4"   BMI: 21.6  Neck: supple without masses or thyromegaly Lungs: clear to auscultation Heart: regular rate and rhythm Abdomen: soft,  diffusely tender in both lower quadrants with voluntary guarding and no organomegaly Pelvic:EGBUS- wnl; vagina-normal rugae with brown discharge; uterus-normal size but tender, cervix without lesions or motion tenderness; adnexae-bilateral  tenderness but no masses Extremities:  no clubbing, cyanosis or edema   Assesment: Chronic Pelvic Pain   Disposition:  A discussion was held with patient regarding the indication for her procedure(s) along with the risks, which include but are not limited to: reaction to anesthesia, damage to adjacent organs, infection and excessive bleeding. The patient verbalized understanding of these risks and has consented to proceed with an Exploratory Laparoscopy at [facility] [place] on November 27, 2014.   CSN# [id number]   [place] J. Lowell Guitar, PA-C  for Dr. Woodroe Mode. Su Hilt  Agree with above.  Will do Dx Laparoscopy +/- chromopertubation.

## 2014-11-27 NOTE — Anesthesia Postprocedure Evaluation (Signed)
  Anesthesia Post-op Note  Patient: Pamela Gallagher  Procedure(s) Performed: Procedure(s): LAPAROSCOPY OPERATIVE (N/A) CHROMOPERTUBATION BIOPSY of endometriosis  Patient Location: PACU  Anesthesia Type:General  Level of Consciousness: awake and alert   Airway and Oxygen Therapy: Patient Spontanous Breathing  Post-op Pain: mild  Post-op Assessment: Post-op Vital signs reviewed, Patient's Cardiovascular Status Stable, Respiratory Function Stable, Patent Airway and No signs of Nausea or vomiting              Post-op Vital Signs: Reviewed and stable  Last Vitals:  Filed Vitals:   11/27/14 1615  BP: 111/65  Pulse: 83  Temp:   Resp: 12    Complications: No apparent anesthesia complications

## 2014-11-27 NOTE — Discharge Instructions (Signed)
Call [facility] @ [phone] if:  You have a temperature greater than or equal to 100.4 degrees Farenheit orally You have pain that is not made better by the pain medication given and taken as directed You have excessive bleeding or problems urinating  Take Colace (Docusate Sodium/Stool Softener) 100 mg 2-3 times daily while taking narcotic pain medicine to avoid constipation or until bowel movements are regular. Take Ibuprofen 600 mg  with food every six hours for 5 days then as needed for pain.  You may drive after 24 hours as long as you're not taking narcotic pain medication  You may shower tomorrow You may resume a regular diet Keep incisions clean and dry  Do not lift over 15 pounds for 6 weeks Avoid anything in vagina  until after your post-operative visit  Keep follow up appointment with Dr. Su Hilt on December 15, 2014 at 11:30 a.m.  MAY TAKE IBUPROFEN (MOTRIN, ADVIL) OR ALEVE AFTER 9:15 PM FOR PAIN!! TAKE THE PATCH BEHIND YOUR EAR OFF

## 2014-11-27 NOTE — Interval H&P Note (Signed)
History and Physical Interval Note:  11/27/2014 1:51 PM  Pamela Gallagher  has presented today for surgery, with the diagnosis of Pelvic Pain  The various methods of treatment have been discussed with the patient and family. After consideration of risks, benefits and other options for treatment, the patient has consented to  Procedure(s): LAPAROSCOPY OPERATIVE (N/A) +/- chromopertubation as a surgical intervention .  The patient's history has been reviewed, patient examined, no change in status, stable for surgery.  I have reviewed the patient's chart and labs.  Questions were answered to the patient's satisfaction.     Purcell Nails

## 2014-11-27 NOTE — Op Note (Addendum)
Preop Diagnosis: Pelvic Pain   Postop Diagnosis: Pelvic Pain   Procedure: 1.OPERATIVE LAPAROSCOPY 2.BIOPSY OF ENDOMETRIOSIS 3.CHROMOPERTUBATION  Anesthesia: General   Anesthesiologist: Sebastian Ache, MD   Attending: Osborn Coho, MD   Assistant: Henreitta Leber, PA-C  Findings: Nl appearing bilateral ovaries and fallopian tubes.  Several areas with appearance of endometriosis over rt uterosacral and posterior cul-de-sac (approximately 4 biopsy sites - 2 in posterior cul-de-sac and 2 over rt uterosacral ligament).  Ureter identified prior to biopsy.    Pathology: Biopsy sites  Fluids: 1500 cc  UOP: 225 cc  EBL: 10 cc  Complications: None  Procedure: Preop Diagnosis: Pelvic Pain   Postop Diagnosis: Pelvic Pain   Procedure: LAPAROSCOPY OPERATIVE   Anesthesia: General   Anesthesiologist: Sebastian Ache, MD   Attending: Osborn Coho, MD   Assistant:   Findings:  Pathology:  Fluids:  UOP:  EBL:  Complications:  Procedure:  The patient was taken to the operating room after the risks, benefits, alternatives, complications, treatment options, and expected outcomes were discussed with the patient. The patient verbalized understanding, the patient concurred with the proposed plan and consent signed and witnessed. The patient was taken to the Operating Room and identified as Pamela Gallagher and the procedure verified as Operative Laparoscopy.  The patient was placed under general anesthesia per anesthesia staff, the patient was placed in modified dorsal lithotomy position and was prepped, draped, and catheterized in the normal, sterile fashion.  A Time Out was held and the above information confirmed.  The cervix was visualized and an intrauterine manipulator was placed.  A 10 mm umbilical incision was then performed. Veress needle was passed and pneumoperitoneum was established. A 10 mm trocar was advanced into the intraabdominal cavity, the laparoscope was  introduced and findings as noted above.  Patient was placed in trendelenburg and marcaine injected in the suprapubic area and a 5 mm incision was made and 5 mm trocar advanced into the intraabdominal cavity.  Erythematous areas as mentioned above in the findings was noted.  Biopsies performed.  Bipolar klepingers were used for hemostasis.  There was one area in the posterior cul-de sac that continued to bleed and cautery applied.  Rectal exam was performed and the peritoneum was overlying the rectum but there appeared to be a fair amount of thickness between the two without any obvious thermal injury.  The 10 mm suprapubic trocar was removed and fascia repaired with 0 vicryl.  The suprapubic trocar was removed as well under direct visualization.  Pneumoperitoneum was relieved and umbilical trocar removed under direct visualization.  The umbilical fascia was repaired with 0 vicryl.  The 10 mm skin incisions were repaired with 3-0 monocryl via a subcuticular stitch and liquaband was applied to all incisions.  Sponge, instrument, lap and needle counts were correct.  The patient tolerated the procedure well and was awaiting extubation and transfer to the recovery room in good condition.

## 2014-11-27 NOTE — Transfer of Care (Signed)
Immediate Anesthesia Transfer of Care Note  Patient: Pamela Gallagher  Procedure(s) Performed: Procedure(s): LAPAROSCOPY OPERATIVE (N/A) CHROMOPERTUBATION BIOPSY of endometriosis  Patient Location: PACU  Anesthesia Type:General  Level of Consciousness: awake  Airway & Oxygen Therapy: Patient Spontanous Breathing  Post-op Assessment: Report given to PACU RN  Post vital signs: stable  Filed Vitals:   11/27/14 1230  BP: 115/76  Pulse: 90  Temp: 36.7 C  Resp: 16    Complications: No apparent anesthesia complications

## 2014-11-28 ENCOUNTER — Encounter (HOSPITAL_COMMUNITY): Payer: Self-pay | Admitting: Obstetrics and Gynecology

## 2015-08-06 ENCOUNTER — Ambulatory Visit (HOSPITAL_COMMUNITY): Payer: Self-pay | Admitting: Psychiatry

## 2015-09-15 ENCOUNTER — Encounter (HOSPITAL_COMMUNITY): Payer: Self-pay | Admitting: Emergency Medicine

## 2015-09-15 ENCOUNTER — Emergency Department (HOSPITAL_COMMUNITY)
Admission: EM | Admit: 2015-09-15 | Discharge: 2015-09-15 | Disposition: A | Discharge status: Home / Self Care | Payer: BLUE CROSS/BLUE SHIELD | Attending: Emergency Medicine | Admitting: Emergency Medicine

## 2015-09-15 ENCOUNTER — Emergency Department (HOSPITAL_COMMUNITY): Payer: BLUE CROSS/BLUE SHIELD

## 2015-09-15 DIAGNOSIS — J45901 Unspecified asthma with (acute) exacerbation: Secondary | ICD-10-CM | POA: Insufficient documentation

## 2015-09-15 DIAGNOSIS — J45909 Unspecified asthma, uncomplicated: Secondary | ICD-10-CM | POA: Diagnosis present

## 2015-09-15 DIAGNOSIS — Z79899 Other long term (current) drug therapy: Secondary | ICD-10-CM | POA: Insufficient documentation

## 2015-09-15 DIAGNOSIS — Z87891 Personal history of nicotine dependence: Secondary | ICD-10-CM | POA: Insufficient documentation

## 2015-09-15 LAB — CBC WITH DIFFERENTIAL/PLATELET
BASOS ABS: 0 10*3/uL (ref 0.0–0.1)
BASOS PCT: 0 %
EOS ABS: 0.2 10*3/uL (ref 0.0–0.7)
EOS PCT: 1 %
HCT: 43.9 % (ref 36.0–46.0)
Hemoglobin: 15.7 g/dL — ABNORMAL HIGH (ref 12.0–15.0)
Lymphocytes Relative: 35 %
Lymphs Abs: 6 10*3/uL — ABNORMAL HIGH (ref 0.7–4.0)
MCH: 31.7 pg (ref 26.0–34.0)
MCHC: 35.8 g/dL (ref 30.0–36.0)
MCV: 88.5 fL (ref 78.0–100.0)
MONO ABS: 1 10*3/uL (ref 0.1–1.0)
Monocytes Relative: 6 %
Neutro Abs: 10 10*3/uL — ABNORMAL HIGH (ref 1.7–7.7)
Neutrophils Relative %: 58 %
Platelets: 312 10*3/uL (ref 150–400)
RBC: 4.96 MIL/uL (ref 3.87–5.11)
RDW: 12.3 % (ref 11.5–15.5)
WBC: 17.2 10*3/uL — AB (ref 4.0–10.5)

## 2015-09-15 LAB — BASIC METABOLIC PANEL
ANION GAP: 11 (ref 5–15)
BUN: 12 mg/dL (ref 6–20)
CALCIUM: 8.8 mg/dL — AB (ref 8.9–10.3)
CO2: 22 mmol/L (ref 22–32)
Chloride: 101 mmol/L (ref 101–111)
Creatinine, Ser: 1.03 mg/dL — ABNORMAL HIGH (ref 0.44–1.00)
GFR calc Af Amer: >60 mL/min (ref >60)
GLUCOSE: 62 mg/dL — AB (ref 65–99)
POTASSIUM: 3.1 mmol/L — AB (ref 3.5–5.1)
SODIUM: 134 mmol/L — AB (ref 135–145)

## 2015-09-15 LAB — CBG MONITORING, ED: Glucose-Capillary: 85 mg/dL (ref 65–99)

## 2015-09-15 LAB — I-STAT BETA HCG BLOOD, ED (MC, WL, AP ONLY): I-stat hCG, quantitative: <5 m[IU]/mL (ref <5)

## 2015-09-15 MED ORDER — ALBUTEROL SULFATE (2.5 MG/3ML) 0.083% IN NEBU
5.0000 mg | INHALATION_SOLUTION | Freq: Once | RESPIRATORY_TRACT | Status: AC
  Administered 2015-09-15: 5 mg via RESPIRATORY_TRACT
  Filled 2015-09-15: qty 6

## 2015-09-15 MED ORDER — SODIUM CHLORIDE 0.9 % IV BOLUS (SEPSIS)
1000.0000 mL | Freq: Once | INTRAVENOUS | Status: AC
  Administered 2015-09-15: 1000 mL via INTRAVENOUS

## 2015-09-15 MED ORDER — IPRATROPIUM BROMIDE 0.02 % IN SOLN
0.5000 mg | Freq: Once | RESPIRATORY_TRACT | Status: AC
  Administered 2015-09-15: 0.5 mg via RESPIRATORY_TRACT
  Filled 2015-09-15: qty 2.5

## 2015-09-15 MED ORDER — METHYLPREDNISOLONE SODIUM SUCC 125 MG IJ SOLR
125.0000 mg | Freq: Once | INTRAMUSCULAR | Status: AC
  Administered 2015-09-15: 125 mg via INTRAVENOUS
  Filled 2015-09-15: qty 2

## 2015-09-15 NOTE — ED Provider Notes (Signed)
CSN: [id number]     Arrival date & time 09/15/15  [id number] History   First MD Initiated Contact with Patient 09/15/15 [phone]     Chief Complaint  Patient presents with  . Asthma  . Shortness of Breath      Patient is a 30 y.o. female presenting with shortness of breath. The history is provided by the patient.  Shortness of Breath Patient presents the emergency department with complaints of bronchitis and shortness of breath.  She has a history of asthma but has not taken her albuterol today.  She states yesterday her albuterol made her nauseated and thus she did not take it today but presents with significant shortness of breath.  No unilateral leg swelling.  No history DVT.  Denies fevers or chills.  Reports ongoing cough.  She's had symptoms for several weeks and his artery on the course of antibiotics.  She is currently in the middle of the steroid taper.  She has nebulized albuterol at home    Past Medical History  Diagnosis Date  . UTI (lower urinary tract infection)   . PTSD (post-traumatic stress disorder)     "adultified child"  . Anxiety and depression   . Abnormal Pap smear 2011  . Asthma   . Endometriosis   . PONV (postoperative nausea and vomiting)    Past Surgical History  Procedure Laterality Date  . Thyroglossoduct cyst      x 2  . Wisdom tooth extraction    . Laparoscopy N/A 11/27/2014    Procedure: LAPAROSCOPY OPERATIVE;  Surgeon: Osborn CohoAngela Roberts, MD;  Location: [facility];  Service: Gynecology;  Laterality: N/A;  . Chromopertubation  11/27/2014    Procedure: CHROMOPERTUBATION;  Surgeon: Osborn CohoAngela Roberts, MD;  Location: [facility];  Service: Gynecology;;  . Biopsy  11/27/2014    Procedure: BIOPSY of endometriosis;  Surgeon: Osborn CohoAngela Roberts, MD;  Location: [facility];  Service: Gynecology;;   Family History  Problem Relation Age of Onset  . Depression Mother   . Anxiety disorder Mother   . Depression Father   . Hearing loss Paternal Uncle    Social History  Substance Use Topics  .  Smoking status: Former Smoker -- 0.50 packs/day for .5 years    Types: Cigarettes, E-cigarettes    Quit date: 06/26/2014  . Smokeless tobacco: Never Used  . Alcohol Use: Yes     Comment: social   OB History    Gravida Para Term Preterm AB TAB SAB Ectopic Multiple Living   0              Review of Systems  Respiratory: Positive for shortness of breath.   All other systems reviewed and are negative.     Allergies  Clindamycin/lincomycin; Penicillins; and Sulfa antibiotics  Home Medications   Prior to Admission medications   Medication Sig Start Date End Date Taking? Authorizing Provider  albuterol (PROVENTIL HFA;VENTOLIN HFA) 108 (90 BASE) MCG/ACT inhaler Inhale 2 puffs into the lungs every 6 (six) hours as needed. Wheezing Patient taking differently: Inhale 2 puffs into the lungs every 6 (six) hours as needed for shortness of breath. Wheezing 10/08/12  Yes Sanjuana KavaAgnes I Nwoko, NP  fluticasone (FLONASE) 50 MCG/ACT nasal spray Place 1 spray into both nostrils daily.  05/15/15 05/14/16 Yes Historical Provider, MD  guaiFENesin-codeine (ROBITUSSIN AC) 100-10 MG/5ML syrup Take 5 mLs by mouth 4 (four) times daily as needed for cough.  09/13/15 09/12/16 Yes Historical Provider, MD  ipratropium-albuterol (DUONEB) 0.5-2.5 (3) MG/3ML SOLN Inhale 3  mLs into the lungs every 4 (four) hours as needed (FOR WHEEZING AND SHORTNESS OF BREATH).  09/13/15  Yes Historical Provider, MD  Melatonin 1 MG CAPS Take 3 mg by mouth at bedtime as needed (FOR SLEEP).    Yes Historical Provider, MD  predniSONE (DELTASONE) 20 MG tablet Take 20-60 mg by mouth daily with breakfast. 3 TABLETS ONCE DAILY FOR 3 DAYS, 2 TABLETS ONCE DAILY FOR 3 DAYS THEN 1 TABLET ONCE DAILY FOR 3 DAYS   Yes Historical Provider, MD   BP 100/71 mmHg  Pulse 77  Temp(Src) 98.3 F (36.8 C) (Oral)  Resp 20  SpO2 99%  LMP 08/16/2015 Physical Exam  Constitutional: She is oriented to person, place, and time. She appears well-developed and  well-nourished. No distress.  HENT:  Head: Normocephalic and atraumatic.  Eyes: EOM are normal.  Neck: Normal range of motion.  Cardiovascular: Regular rhythm and normal heart sounds.   Tachycardia  Pulmonary/Chest: She has wheezes.  Tachypnea.  Mild accessory muscle use  Abdominal: Soft. She exhibits no distension.  Musculoskeletal: Normal range of motion.  Neurological: She is alert and oriented to person, place, and time.  Skin: Skin is warm and dry.  Psychiatric: She has a normal mood and affect. Judgment normal.  Nursing note and vitals reviewed.   ED Course  Procedures (including critical care time) Labs Review Labs Reviewed  CBC WITH DIFFERENTIAL/PLATELET - Abnormal; Notable for the following:    WBC 17.2 (*)    Hemoglobin 15.7 (*)    Neutro Abs 10.0 (*)    Lymphs Abs 6.0 (*)    All other components within normal limits  BASIC METABOLIC PANEL - Abnormal; Notable for the following:    Sodium 134 (*)    Potassium 3.1 (*)    Glucose, Bld 62 (*)    Creatinine, Ser 1.03 (*)    Calcium 8.8 (*)    All other components within normal limits  I-STAT BETA HCG BLOOD, ED ([facility], [facility], [facility] ONLY)  CBG MONITORING, ED    Imaging Review Dg Chest Portable 1 View  09/15/2015  CLINICAL DATA:  Shortness of breath. EXAM: PORTABLE CHEST 1 VIEW COMPARISON:  February 10, 2012. FINDINGS: The heart size and mediastinal contours are within normal limits. Both lungs are clear. No pneumothorax or pleural effusion is noted. The visualized skeletal structures are unremarkable. IMPRESSION: No acute cardiopulmonary abnormality seen. Electronically Signed   By: Lupita Raider, M.D.   On: 09/15/2015 09:44   I have personally reviewed and evaluated these images and lab results as part of my medical decision-making.   EKG Interpretation None      MDM   Final diagnoses:  None    10:17 AM Patient feels much better after albuterol and Atrovent at this time.  Patient be discharged home in good  condition.  She is currently in the middle of the steroid taper and she will continue this.  She has albuterol at home.  No indication for additional prescriptions necessary.  She understands to return to the ER for new or worsening symptoms      Azalia Bilis, MD 09/15/15 1017

## 2015-09-15 NOTE — Discharge Instructions (Signed)

## 2015-09-15 NOTE — ED Notes (Signed)
Pt recent diagnoses of bronchitis with increased SOB over past 24 hours.

## 2015-09-15 NOTE — ED Notes (Signed)
Pt reports IV pulled out with transfer to restroom. Gauze and tape applied to site. Pt received 750 ml of bolus prior to IV removed.

## 2016-06-30 ENCOUNTER — Encounter (HOSPITAL_COMMUNITY): Payer: Self-pay | Admitting: Emergency Medicine

## 2016-06-30 ENCOUNTER — Emergency Department (HOSPITAL_COMMUNITY)
Admission: EM | Admit: 2016-06-30 | Discharge: 2016-06-30 | Disposition: A | Discharge status: Home / Self Care | Payer: No Typology Code available for payment source | Attending: Emergency Medicine | Admitting: Emergency Medicine

## 2016-06-30 DIAGNOSIS — Z87891 Personal history of nicotine dependence: Secondary | ICD-10-CM | POA: Insufficient documentation

## 2016-06-30 DIAGNOSIS — J45909 Unspecified asthma, uncomplicated: Secondary | ICD-10-CM | POA: Insufficient documentation

## 2016-06-30 DIAGNOSIS — L509 Urticaria, unspecified: Secondary | ICD-10-CM | POA: Insufficient documentation

## 2016-06-30 MED ORDER — PREDNISONE 20 MG PO TABS: 40.0000 mg | ORAL_TABLET | Freq: Every day | ORAL | 0 refills | Status: DC

## 2016-06-30 MED ORDER — DIPHENHYDRAMINE HCL 50 MG/ML IJ SOLN
25.0000 mg | Freq: Once | INTRAMUSCULAR | Status: AC
  Administered 2016-06-30: 25 mg via INTRAVENOUS
  Filled 2016-06-30: qty 1

## 2016-06-30 MED ORDER — FAMOTIDINE IN NACL 20-0.9 MG/50ML-% IV SOLN
20.0000 mg | Freq: Once | INTRAVENOUS | Status: AC
  Administered 2016-06-30: 20 mg via INTRAVENOUS
  Filled 2016-06-30: qty 50

## 2016-06-30 MED ORDER — SODIUM CHLORIDE 0.9 % IV BOLUS (SEPSIS)
1000.0000 mL | Freq: Once | INTRAVENOUS | Status: AC
  Administered 2016-06-30: 1000 mL via INTRAVENOUS

## 2016-06-30 MED ORDER — LORAZEPAM 0.5 MG PO TABS: 0.5000 mg | ORAL_TABLET | Freq: Three times a day (TID) | ORAL | 0 refills | Status: DC | PRN

## 2016-06-30 MED ORDER — METHYLPREDNISOLONE SODIUM SUCC 125 MG IJ SOLR
125.0000 mg | Freq: Once | INTRAMUSCULAR | Status: AC
  Administered 2016-06-30: 125 mg via INTRAVENOUS
  Filled 2016-06-30: qty 2

## 2016-06-30 NOTE — ED Provider Notes (Signed)
[facility]-EM[facility]ENCY DEPT Provider Note   CSN: [id number] Arrival date & time: 06/30/16  4098     History   Chief Complaint Chief Complaint  Patient presents with  . Urticaria    HPI Pamela Gallagher is a 31 y.o. female.  HPI  Patient presents with concern of recurrent urticaria and throat fullness Patient has a history of similar episodes going back about 10 years, now he is in the midst of a 3 week period of worsening, more persistent symptoms. She notes over the past few days, she has had more severe, more diffuse urticarial lesions throughout her habitus This includes sensation of throat fullness, no syncope, no chest pain, no dyspnea. Patient has seen multiple physicians, allergists, with no clear etiology for her recurrent phenomena. During this illness, no clear precipitant, no clear alleviating or exacerbating factors. She does note, however, that symptoms seem more prominent at night, improve during the early morning.   Past Medical History:  Diagnosis Date  . Abnormal Pap smear 2011  . Anxiety and depression   . Asthma   . Endometriosis   . PONV (postoperative nausea and vomiting)   . PTSD (post-traumatic stress disorder)    "adultified child"  . UTI (lower urinary tract infection)     Patient Active Problem List   Diagnosis Date Noted  . Benzodiazepine dependence (HCC) 10/07/2012  . Anovulatory (dysfunctional uterine) bleeding 08/23/2012  . Costochondral chest pain 02/10/2012  . Dysuria 01/14/2012    Past Surgical History:  Procedure Laterality Date  . BIOPSY  11/27/2014   Procedure: BIOPSY of endometriosis;  Surgeon: Osborn Coho, MD;  Location: [facility];  Service: Gynecology;;  . Christianne Borrow  11/27/2014   Procedure: CHROMOPERTUBATION;  Surgeon: Osborn Coho, MD;  Location: [facility];  Service: Gynecology;;  . LAPAROSCOPY N/A 11/27/2014   Procedure: LAPAROSCOPY OPERATIVE;  Surgeon: Osborn Coho, MD;  Location: [facility];  Service: Gynecology;  Laterality:  N/A;  . thyroglossoduct cyst     x 2  . WISDOM TOOTH EXTRACTION      OB History    Gravida Para Term Preterm AB Living   0             SAB TAB Ectopic Multiple Live Births                   Home Medications    Prior to Admission medications   Medication Sig Start Date End Date Taking? Authorizing Provider  albuterol (PROVENTIL HFA;VENTOLIN HFA) 108 (90 BASE) MCG/ACT inhaler Inhale 2 puffs into the lungs every 6 (six) hours as needed. Wheezing Patient taking differently: Inhale 2 puffs into the lungs every 6 (six) hours as needed for shortness of breath. Wheezing 10/08/12   Sanjuana Kava, NP  fluticasone (FLONASE) 50 MCG/ACT nasal spray Place 1 spray into both nostrils daily.  05/15/15 05/14/16  Historical Provider, MD  guaiFENesin-codeine (ROBITUSSIN AC) 100-10 MG/5ML syrup Take 5 mLs by mouth 4 (four) times daily as needed for cough.  09/13/15 09/12/16  Historical Provider, MD  ipratropium-albuterol (DUONEB) 0.5-2.5 (3) MG/3ML SOLN Inhale 3 mLs into the lungs every 4 (four) hours as needed (FOR WHEEZING AND SHORTNESS OF BREATH).  09/13/15   Historical Provider, MD  Melatonin 1 MG CAPS Take 3 mg by mouth at bedtime as needed (FOR SLEEP).     Historical Provider, MD  predniSONE (DELTASONE) 20 MG tablet Take 20-60 mg by mouth daily with breakfast. 3 TABLETS ONCE DAILY FOR 3 DAYS, 2 TABLETS ONCE DAILY FOR 3  DAYS THEN 1 TABLET ONCE DAILY FOR 3 DAYS    Historical Provider, MD    Family History Family History  Problem Relation Age of Onset  . Depression Mother   . Anxiety disorder Mother   . Depression Father   . Hearing loss Paternal Uncle     Social History Social History  Substance Use Topics  . Smoking status: Former Smoker    Packs/day: 0.50    Years: 0.50    Types: Cigarettes, E-cigarettes    Quit date: 06/26/2014  . Smokeless tobacco: Never Used  . Alcohol use Yes     Comment: social     Allergies   Clindamycin/lincomycin; Penicillins; and Sulfa antibiotics   Review of  Systems Review of Systems  Constitutional:       Per HPI, otherwise negative  HENT:       Per HPI, otherwise negative  Respiratory:       Per HPI, otherwise negative  Cardiovascular:       Per HPI, otherwise negative  Gastrointestinal: Negative for vomiting.  Endocrine:       Negative aside from HPI  Genitourinary:       Neg aside from HPI   Musculoskeletal:       Per HPI, otherwise negative  Skin: Positive for color change and rash.  Allergic/Immunologic:       Per history of present illness  Neurological: Negative for syncope.  Hematological: Negative.      Physical Exam Updated Vital Signs BP 128/78 (BP Location: Right Arm)   Pulse 93   Temp 98.3 F (36.8 C) (Oral)   Resp 17   Ht 5\' 3"  (1.6 m)   Wt 125 lb (56.7 kg)   LMP 06/25/2016 (Exact Date)   SpO2 96%   BMI 22.14 kg/m   Physical Exam  Constitutional: She is oriented to person, place, and time. She appears well-developed and well-nourished. No distress.  HENT:  Head: Normocephalic and atraumatic.  Mouth/Throat: Oropharynx is clear and moist. No oropharyngeal exudate.  Eyes: Conjunctivae and EOM are normal.  Cardiovascular: Normal rate and regular rhythm.   Pulmonary/Chest: Effort normal and breath sounds normal. No stridor. No respiratory distress.  Abdominal: She exhibits no distension.  Musculoskeletal: She exhibits no edema.  Neurological: She is alert and oriented to person, place, and time. No cranial nerve deficit.  Skin: Skin is warm and dry. Rash noted.  Serpiginous urticarial lesion on both thighs, both knees.  Psychiatric: She has a normal mood and affect.  Nursing note and vitals reviewed.    ED Treatments / Results   Procedures Procedures (including critical care time)  Medications Ordered in ED Medications  famotidine (PEPCID) IVPB 20 mg premix (20 mg Intravenous New Bag/Given 06/30/16 0930)  sodium chloride 0.9 % bolus 1,000 mL (1,000 mLs Intravenous New Bag/Given 06/30/16 0930)    methylPREDNISolone sodium succinate (SOLU-MEDROL) 125 mg/2 mL injection 125 mg (125 mg Intravenous Given 06/30/16 0930)  diphenhydrAMINE (BENADRYL) injection 25 mg (25 mg Intravenous Given 06/30/16 0930)     Initial Impression / Assessment and Plan / ED Course  I have reviewed the triage vital signs and the nursing notes.  Pertinent labs & imaging results that were available during my care of the patient were reviewed by me and considered in my medical decision making (see chart for details).   Update: Patient appears better, lesion on both legs has resolved. I reviewed the patient's evaluation thus far during this illness, and is clear that she has never  seen an endocrinologist per Given the diurnal variation in her symptoms, this seems like a reasonable outpatient plan if she continues to improve here.   Final Clinical Impressions(s) / ED Diagnoses  Rash  This generally well young female presents with recurrent, vertebral rash, throat fullness. Here, after monitoring, steroids, antihistamines, the patient has substantial improvement. With no evidence for anaphylaxis, no respiratory compromise, appropriate for outpatient follow-up, with primary care and endocrinology.    Gerhard Munchobert Jakeya Gherardi, MD 06/30/16 1050

## 2016-06-30 NOTE — ED Triage Notes (Signed)
Patient complains of generalized "itching and burning" all over her body for the last 10 years.  States it has gotten worse over time.   States she has had an allergy test that indicated she was allergic to some plants and molds.  Patient alert and oriented and in no apparent distress at this time.

## 2016-06-30 NOTE — Discharge Instructions (Signed)
As discussed, your evaluation today has been largely reassuring.  But, it is important that you monitor your condition carefully, and do not hesitate to return to the ED if you develop new, or concerning changes in your condition.  Please follow-up with your physician and out endocrinologist for appropriate ongoing care.

## 2017-04-22 DIAGNOSIS — F332 Major depressive disorder, recurrent severe without psychotic features: Secondary | ICD-10-CM | POA: Insufficient documentation

## 2017-04-22 DIAGNOSIS — F331 Major depressive disorder, recurrent, moderate: Secondary | ICD-10-CM | POA: Insufficient documentation

## 2017-09-23 DIAGNOSIS — F410 Panic disorder [episodic paroxysmal anxiety] without agoraphobia: Secondary | ICD-10-CM | POA: Insufficient documentation

## 2017-10-09 ENCOUNTER — Other Ambulatory Visit (HOSPITAL_COMMUNITY): Payer: Self-pay | Admitting: Obstetrics and Gynecology

## 2017-10-09 DIAGNOSIS — N979 Female infertility, unspecified: Secondary | ICD-10-CM

## 2017-11-05 ENCOUNTER — Ambulatory Visit (HOSPITAL_COMMUNITY): Payer: No Typology Code available for payment source

## 2017-11-05 ENCOUNTER — Ambulatory Visit (HOSPITAL_COMMUNITY)
Admission: RE | Admit: 2017-11-05 | Discharge: 2017-11-05 | Disposition: A | Discharge status: Home / Self Care | Payer: BLUE CROSS/BLUE SHIELD | Source: Ambulatory Visit | Attending: Obstetrics and Gynecology | Admitting: Obstetrics and Gynecology

## 2017-11-05 DIAGNOSIS — N979 Female infertility, unspecified: Secondary | ICD-10-CM | POA: Insufficient documentation

## 2017-11-05 MED ORDER — IOPAMIDOL (ISOVUE-300) INJECTION 61%
30.0000 mL | Freq: Once | INTRAVENOUS | Status: AC | PRN
  Administered 2017-11-05: 30 mL

## 2017-11-05 NOTE — Progress Notes (Signed)
Pt's LMP was 10/17/17.  She presents today for HSG.  R/B/A discussed previously.  Catheter introduced in a sterile fashion.  13 cc of isovue 300 injected into uterus and bilateral tubes appeared patent with bilateral free spill.  No obvious uterine defects were noted.  Patient tolerated the procedure well and will f/u in the office in a couple of weeks.  Official report by radiologist.

## 2018-01-22 DIAGNOSIS — J069 Acute upper respiratory infection, unspecified: Secondary | ICD-10-CM | POA: Diagnosis not present

## 2018-01-28 DIAGNOSIS — M9901 Segmental and somatic dysfunction of cervical region: Secondary | ICD-10-CM | POA: Diagnosis not present

## 2018-01-29 DIAGNOSIS — F332 Major depressive disorder, recurrent severe without psychotic features: Secondary | ICD-10-CM | POA: Diagnosis not present

## 2018-02-25 DIAGNOSIS — Z331 Pregnant state, incidental: Secondary | ICD-10-CM | POA: Diagnosis not present

## 2018-02-25 DIAGNOSIS — N760 Acute vaginitis: Secondary | ICD-10-CM | POA: Diagnosis not present

## 2018-02-25 DIAGNOSIS — N898 Other specified noninflammatory disorders of vagina: Secondary | ICD-10-CM | POA: Diagnosis not present

## 2018-02-25 DIAGNOSIS — A499 Bacterial infection, unspecified: Secondary | ICD-10-CM | POA: Diagnosis not present

## 2018-02-25 DIAGNOSIS — E559 Vitamin D deficiency, unspecified: Secondary | ICD-10-CM | POA: Diagnosis not present

## 2018-02-27 ENCOUNTER — Other Ambulatory Visit (HOSPITAL_COMMUNITY)
Admission: RE | Admit: 2018-02-27 | Discharge: 2018-02-27 | Disposition: A | Discharge status: Home / Self Care | Payer: BLUE CROSS/BLUE SHIELD | Source: Ambulatory Visit | Attending: Obstetrics and Gynecology | Admitting: Obstetrics and Gynecology

## 2018-02-27 DIAGNOSIS — Z3201 Encounter for pregnancy test, result positive: Secondary | ICD-10-CM | POA: Insufficient documentation

## 2018-02-27 LAB — HCG, QUANTITATIVE, PREGNANCY: hCG, Beta Chain, Quant, S: 8171 m[IU]/mL — ABNORMAL HIGH

## 2018-03-24 DIAGNOSIS — N898 Other specified noninflammatory disorders of vagina: Secondary | ICD-10-CM | POA: Diagnosis not present

## 2018-03-24 DIAGNOSIS — N925 Other specified irregular menstruation: Secondary | ICD-10-CM | POA: Diagnosis not present

## 2018-03-24 LAB — OB RESULTS CONSOLE GC/CHLAMYDIA
Chlamydia: NEGATIVE
GC PROBE AMP, GENITAL: NEGATIVE

## 2018-04-21 NOTE — L&D Delivery Note (Signed)
Operative Delivery Note Pt became complete at 1240 and labored down until 1330 when she began pushing. At 1400 Drs Juleen China and Rip Harbour were called to expedite delivery due to persistent deep FHR variables with each push.  At 2:14 PM a viable female was delivered via Vaginal,  Outlet Vacuum (Extractor).  Presentation: vertex; Position: Right,, Occiput,, Anterior; Station: +3 by Dr Gerri Lins under the presence of Dr Rip Harbour. Nuchal cord x 1 reduced prior to delivery; infant dried and placed on pt's abd; cord clamped and cut by pt after 1 min delay; hospital cord blood sample collected for lab.  Verbal consent: obtained from patient.  Risks and benefits discussed in detail.  Risks include, but are not limited to the risks of anesthesia, bleeding, infection, damage to maternal tissues, fetal cephalhematoma.  There is also the risk of inability to effect vaginal delivery of the head, or shoulder dystocia that cannot be resolved by established maneuvers, leading to the need for emergency cesarean section.  APGAR: 9, 9; weight 3130 gm (6lb 14.4oz)   Placenta status: spont ,intact .   Cord: 3 vessel  Anesthesia:  Epidural Instruments: Kiwi vacuum Episiotomy: None Lacerations: 1st degree;Vaginal (repaired by Dr Janus Molder) Suture Repair: 3.0 monocryl Est. Blood Loss (mL):  255cc  Mom to postpartum.  Baby to Couplet care / Skin to Skin.  Myrtis Ser CNM 10/25/2018, 2:38 PM  Please schedule this patient for Postpartum visit in: 4 weeks with the following provider: Any provider For C/S patients schedule nurse incision check in weeks 2 weeks: no Low risk pregnancy complicated by: hx anxiety/depression- on meds Delivery mode:  Vacuum Anticipated Birth Control:  POPs PP Procedures needed: none  Schedule Integrated [place] visit: no

## 2018-04-30 ENCOUNTER — Encounter: Payer: Self-pay | Admitting: *Deleted

## 2018-04-30 DIAGNOSIS — Z3400 Encounter for supervision of normal first pregnancy, unspecified trimester: Secondary | ICD-10-CM | POA: Insufficient documentation

## 2018-04-30 DIAGNOSIS — Z3402 Encounter for supervision of normal first pregnancy, second trimester: Secondary | ICD-10-CM

## 2018-05-04 ENCOUNTER — Ambulatory Visit (INDEPENDENT_AMBULATORY_CARE_PROVIDER_SITE_OTHER): Payer: BLUE CROSS/BLUE SHIELD | Admitting: Women's Health

## 2018-05-04 ENCOUNTER — Encounter: Payer: Self-pay | Admitting: Women's Health

## 2018-05-04 ENCOUNTER — Ambulatory Visit: Payer: Self-pay | Admitting: *Deleted

## 2018-05-04 ENCOUNTER — Other Ambulatory Visit: Payer: Self-pay

## 2018-05-04 VITALS — BP 101/62 | HR 95 | Wt 126.0 lb

## 2018-05-04 DIAGNOSIS — Z1379 Encounter for other screening for genetic and chromosomal anomalies: Secondary | ICD-10-CM | POA: Diagnosis not present

## 2018-05-04 DIAGNOSIS — F17200 Nicotine dependence, unspecified, uncomplicated: Secondary | ICD-10-CM

## 2018-05-04 DIAGNOSIS — Z3A15 15 weeks gestation of pregnancy: Secondary | ICD-10-CM

## 2018-05-04 DIAGNOSIS — Z87891 Personal history of nicotine dependence: Secondary | ICD-10-CM | POA: Insufficient documentation

## 2018-05-04 DIAGNOSIS — Z331 Pregnant state, incidental: Secondary | ICD-10-CM

## 2018-05-04 DIAGNOSIS — F17210 Nicotine dependence, cigarettes, uncomplicated: Secondary | ICD-10-CM | POA: Insufficient documentation

## 2018-05-04 DIAGNOSIS — F418 Other specified anxiety disorders: Secondary | ICD-10-CM

## 2018-05-04 DIAGNOSIS — Z1389 Encounter for screening for other disorder: Secondary | ICD-10-CM

## 2018-05-04 DIAGNOSIS — Z363 Encounter for antenatal screening for malformations: Secondary | ICD-10-CM

## 2018-05-04 DIAGNOSIS — Z3402 Encounter for supervision of normal first pregnancy, second trimester: Secondary | ICD-10-CM | POA: Diagnosis not present

## 2018-05-04 LAB — POCT URINALYSIS DIPSTICK OB
Blood, UA: NEGATIVE
Glucose, UA: NEGATIVE
Ketones, UA: NEGATIVE
LEUKOCYTES UA: NEGATIVE
NITRITE UA: NEGATIVE
POC,PROTEIN,UA: NEGATIVE

## 2018-05-04 MED ORDER — ONDANSETRON HCL 4 MG PO TABS: 4.0000 mg | ORAL_TABLET | Freq: Three times a day (TID) | ORAL | 0 refills | Status: DC | PRN

## 2018-05-04 NOTE — Patient Instructions (Signed)
Juanda Crumble, I greatly value your feedback.  If you receive a survey following your visit with [place] today, we appreciate you taking the time to fill it out.  Thanks, Joellyn Haff, CNM, [facility]NP-BC   Second Trimester of Pregnancy The second trimester is from week 14 through week 27 (months 4 through 6). The second trimester is often a time when you feel your best. Your body has adjusted to being pregnant, and you begin to feel better physically. Usually, morning sickness has lessened or quit completely, you may have more energy, and you may have an increase in appetite. The second trimester is also a time when the fetus is growing rapidly. At the end of the sixth month, the fetus is about 9 inches long and weighs about 1 pounds. You will likely begin to feel the baby move (quickening) between 16 and 20 weeks of pregnancy. Body changes during your second trimester Your body continues to go through many changes during your second trimester. The changes vary from woman to woman.  Your weight will continue to increase. You will notice your lower abdomen bulging out.  You may begin to get stretch marks on your hips, abdomen, and breasts.  You may develop headaches that can be relieved by medicines. The medicines should be approved by your health care provider.  You may urinate more often because the fetus is pressing on your bladder.  You may develop or continue to have heartburn as a result of your pregnancy.  You may develop constipation because certain hormones are causing the muscles that push waste through your intestines to slow down.  You may develop hemorrhoids or swollen, bulging veins (varicose veins).  You may have back pain. This is caused by: ? Weight gain. ? Pregnancy hormones that are relaxing the joints in your pelvis. ? A shift in weight and the muscles that support your balance.  Your breasts will continue to grow and they will continue to become tender.  Your gums may bleed  and may be sensitive to brushing and flossing.  Dark spots or blotches (chloasma, mask of pregnancy) may develop on your face. This will likely fade after the baby is born.  A dark line from your belly button to the pubic area (linea nigra) may appear. This will likely fade after the baby is born.  You may have changes in your hair. These can include thickening of your hair, rapid growth, and changes in texture. Some women also have hair loss during or after pregnancy, or hair that feels dry or thin. Your hair will most likely return to normal after your baby is born.  What to expect at prenatal visits During a routine prenatal visit:  You will be weighed to make sure you and the fetus are growing normally.  Your blood pressure will be taken.  Your abdomen will be measured to track your baby's growth.  The fetal heartbeat will be listened to.  Any test results from the previous visit will be discussed.  Your health care provider may ask you:  How you are feeling.  If you are feeling the baby move.  If you have had any abnormal symptoms, such as leaking fluid, bleeding, severe headaches, or abdominal cramping.  If you are using any tobacco products, including cigarettes, chewing tobacco, and electronic cigarettes.  If you have any questions.  Other tests that may be performed during your second trimester include:  Blood tests that check for: ? Low iron levels (anemia). ? High  blood sugar that affects pregnant women (gestational diabetes) between 3 and 28 weeks. ? Rh antibodies. This is to check for a protein on red blood cells (Rh factor).  Urine tests to check for infections, diabetes, or protein in the urine.  An ultrasound to confirm the proper growth and development of the baby.  An amniocentesis to check for possible genetic problems.  Fetal screens for spina bifida and Down syndrome.  HIV (human immunodeficiency virus) testing. Routine prenatal testing includes  screening for HIV, unless you choose not to have this test.  Follow these instructions at home: Medicines  Follow your health care provider's instructions regarding medicine use. Specific medicines may be either safe or unsafe to take during pregnancy.  Take a prenatal vitamin that contains at least 600 micrograms (mcg) of folic acid.  If you develop constipation, try taking a stool softener if your health care provider approves. Eating and drinking  Eat a balanced diet that includes fresh fruits and vegetables, whole grains, good sources of protein such as meat, eggs, or tofu, and low-fat dairy. Your health care provider will help you determine the amount of weight gain that is right for you.  Avoid raw meat and uncooked cheese. These carry germs that can cause birth defects in the baby.  If you have low calcium intake from food, talk to your health care provider about whether you should take a daily calcium supplement.  Limit foods that are high in fat and processed sugars, such as fried and sweet foods.  To prevent constipation: ? Drink enough fluid to keep your urine clear or pale yellow. ? Eat foods that are high in fiber, such as fresh fruits and vegetables, whole grains, and beans. Activity  Exercise only as directed by your health care provider. Most women can continue their usual exercise routine during pregnancy. Try to exercise for 30 minutes at least 5 days a week. Stop exercising if you experience uterine contractions.  Avoid heavy lifting, wear low heel shoes, and practice good posture.  A sexual relationship may be continued unless your health care provider directs you otherwise. Relieving pain and discomfort  Wear a good support bra to prevent discomfort from breast tenderness.  Take warm sitz baths to soothe any pain or discomfort caused by hemorrhoids. Use hemorrhoid cream if your health care provider approves.  Rest with your legs elevated if you have leg cramps  or low back pain.  If you develop varicose veins, wear support hose. Elevate your feet for 15 minutes, 3-4 times a day. Limit salt in your diet. Prenatal Care  Write down your questions. Take them to your prenatal visits.  Keep all your prenatal visits as told by your health care provider. This is important. Safety  Wear your seat belt at all times when driving.  Make a list of emergency phone numbers, including numbers for family, friends, the hospital, and police and fire departments. General instructions  Ask your health care provider for a referral to a local prenatal education class. Begin classes no later than the beginning of month 6 of your pregnancy.  Ask for help if you have counseling or nutritional needs during pregnancy. Your health care provider can offer advice or refer you to specialists for help with various needs.  Do not use hot tubs, steam rooms, or saunas.  Do not douche or use tampons or scented sanitary pads.  Do not cross your legs for long periods of time.  Avoid cat litter boxes and soil  used by cats. These carry germs that can cause birth defects in the baby and possibly loss of the fetus by miscarriage or stillbirth.  Avoid all smoking, herbs, alcohol, and unprescribed drugs. Chemicals in these products can affect the formation and growth of the baby.  Do not use any products that contain nicotine or tobacco, such as cigarettes and e-cigarettes. If you need help quitting, ask your health care provider.  Visit your dentist if you have not gone yet during your pregnancy. Use a soft toothbrush to brush your teeth and be gentle when you floss. Contact a health care provider if:  You have dizziness.  You have mild pelvic cramps, pelvic pressure, or nagging pain in the abdominal area.  You have persistent nausea, vomiting, or diarrhea.  You have a bad smelling vaginal discharge.  You have pain when you urinate. Get help right away if:  You have a  fever.  You are leaking fluid from your vagina.  You have spotting or bleeding from your vagina.  You have severe abdominal cramping or pain.  You have rapid weight gain or weight loss.  You have shortness of breath with chest pain.  You notice sudden or extreme swelling of your face, hands, ankles, feet, or legs.  You have not felt your baby move in over an hour.  You have severe headaches that do not go away when you take medicine.  You have vision changes. Summary  The second trimester is from week 14 through week 27 (months 4 through 6). It is also a time when the fetus is growing rapidly.  Your body goes through many changes during pregnancy. The changes vary from woman to woman.  Avoid all smoking, herbs, alcohol, and unprescribed drugs. These chemicals affect the formation and growth your baby.  Do not use any tobacco products, such as cigarettes, chewing tobacco, and e-cigarettes. If you need help quitting, ask your health care provider.  Contact your health care provider if you have any questions. Keep all prenatal visits as told by your health care provider. This is important. This information is not intended to replace advice given to you by your health care provider. Make sure you discuss any questions you have with your health care provider. Document Released: 04/01/2001 Document Revised: 09/13/2015 Document Reviewed: 06/08/2012 Elsevier Interactive Patient Education  2017 [employer].

## 2018-05-04 NOTE — Progress Notes (Signed)
INITIAL OBSTETRICAL VISIT Patient name: Pamela Gallagher [place] MRN [id number]  Date of birth: 08/18/1985 Chief Complaint:   Initial Prenatal Visit (transfer from [facility])  History of Present Illness:   Pamela Gallagher Kurihara is a 33 y.o. G1P0 Caucasian female at 5629w0d by 9wk u/s, with an Estimated Date of Delivery: 10/26/18 being seen today for her initial obstetrical visit with us, transferring from [facility].   Her obstetrical history is significant for primigravida.   Today she reports Gallagher/v, improving, but still occ vomits, requests refill on zofran H/O dep/anx/PTSD- no meds currently, doing well Smoker: 1/2-1ppd down to 3 cigarettes/day, trying to quit completely Asthma- inhaler prn Patient's last menstrual period was 10/17/2017. Last pap 10/09/17. Results were: normal Review of Systems:   Pertinent items are noted in HPI Denies cramping/contractions, leakage of fluid, vaginal bleeding, abnormal vaginal discharge w/ itching/odor/irritation, headaches, visual changes, shortness of breath, chest pain, abdominal pain, severe nausea/vomiting, or problems with urination or bowel movements unless otherwise stated above.  Pertinent History Reviewed:  Reviewed past medical,surgical, social, obstetrical and family history.  Reviewed problem list, medications and allergies. OB History  Gravida Para Term Preterm AB Living  1            SAB TAB Ectopic Multiple Live Births               # Outcome Date GA Lbr Len/2nd Weight Sex Delivery Anes PTL Lv  1 Current            Physical Assessment:   Vitals:   05/04/18 1355  BP: 101/62  Pulse: 95  Weight: 126 lb (57.2 kg)  Body mass index is 22.32 kg/m.       Physical Examination:  General appearance - well appearing, and in no distress  Mental status - alert, oriented to person, place, and time  Psych:  She has a normal mood and affect  Skin - warm and dry, normal color, no suspicious lesions noted  Chest - effort normal, all lung fields clear to auscultation  bilaterally  Heart - normal rate and regular rhythm  Abdomen - soft, nontender  Extremities:  No swelling or varicosities noted  Thin prep pap is not done   Fetal Heart Rate (bpm): 145 via doppler  Results for orders placed or performed in visit on 05/04/18 (from the past 24 hour(s))  POC Urinalysis Dipstick OB   Collection Time: 05/04/18  2:26 PM  Result Value Ref Range   Color, UA     Clarity, UA     Glucose, UA Negative Negative   Bilirubin, UA     Ketones, UA neg    Spec Grav, UA     Blood, UA neg    pH, UA     POC,PROTEIN,UA Negative Negative, Trace, Small (1+), Moderate (2+), Large (3+), 4+   Urobilinogen, UA     Nitrite, UA neg    Leukocytes, UA Negative Negative   Appearance     Odor      Assessment & Plan:  1) Low-Risk Pregnancy G1P0 at 829w0d with an Estimated Date of Delivery: 10/26/18   2) Initial OB visit  3) Dep/anx/PTSD> no meds currently, doing well  4) Gallagher/V> rx zofran  5) Asthma> well controlled, inhaler prn  6) Wants to do BS Optimized> signed up, discussed  7) Smoker> 1/2-1ppd down to 3cigarettes/day, counseled, advised cessation, discussed risks to fetus while pregnant, to infant pp, and to herself. Offered QuitlineNC, declined, will try to quit on her own  Meds:  Meds ordered this encounter  Medications  . ondansetron (ZOFRAN) 4 MG tablet    Sig: Take 1 tablet (4 mg total) by mouth every 8 (eight) hours as needed for nausea or vomiting.    Dispense:  20 tablet    Refill:  0    Order Specific Question:   Supervising Provider    Answer:   Lazaro Arms [[id number]]    Initial labs obtained Continue prenatal vitamins Reviewed Gallagher/v relief measures and warning s/s to report Reviewed recommended weight gain based on pre-gravid BMI Encouraged well-balanced diet Genetic Screening discussed Quad Screen: requested Cystic fibrosis screening discussed declined Ultrasound discussed; fetal survey: requested [facility] completed> unsure if applying for preg mcaid,  may try, PCM not here Flu shot 02/2018  Follow-up: Return in about 3 weeks (around 05/25/2018) for LROB, PO:IPPGFQM.   Orders Placed This Encounter  Procedures  . GC/Chlamydia Probe Amp  . Urine Culture  . US OB Comp + 14 Wk  . Obstetric Panel, Including HIV  . Urinalysis, Routine w reflex microscopic  . Sickle cell screen  . AFP TETRA  . Pain Management Screening Profile (10S)  . POC Urinalysis Dipstick OB    Cheral Marker CNM, [facility] 05/04/2018 3:22 PM

## 2018-05-05 LAB — PMP SCREEN PROFILE (10S), URINE
AMPHETAMINE SCREEN URINE: NEGATIVE ng/mL
BARBITURATE SCREEN URINE: NEGATIVE ng/mL
BENZODIAZEPINE SCREEN, URINE: NEGATIVE ng/mL
CANNABINOIDS UR QL SCN: POSITIVE ng/mL — AB
COCAINE(METAB.)SCREEN, URINE: NEGATIVE ng/mL
CREATININE(CRT), U: 27.4 mg/dL (ref 20.0–300.0)
Methadone Screen, Urine: NEGATIVE ng/mL
OPIATE SCREEN URINE: NEGATIVE ng/mL
OXYCODONE+OXYMORPHONE UR QL SCN: NEGATIVE ng/mL
PHENCYCLIDINE QUANTITATIVE URINE: NEGATIVE ng/mL
PROPOXYPHENE SCREEN URINE: NEGATIVE ng/mL
Ph of Urine: 6.3 (ref 4.5–8.9)

## 2018-05-06 LAB — AFP TETRA
DIA MOM VALUE: 1.51
DIA VALUE (EIA): 307.62 pg/mL
DSR (By Age)    1 IN: 332
DSR (Second Trimester) 1 IN: 3357
GESTATIONAL AGE AFP: 15 wk
MSAFP Mom: 1.55
MSAFP: 50.6 ng/mL
MSHCG MOM: 0.58
MSHCG: 38384 m[IU]/mL
Maternal Age At EDD: 34.5 yr
Osb Risk: 2393
Test Results:: NEGATIVE
UE3 MOM: 1.42
Weight: 126 [lb_av]
uE3 Value: 1.09 ng/mL

## 2018-05-06 LAB — URINALYSIS, ROUTINE W REFLEX MICROSCOPIC
Bilirubin, UA: NEGATIVE
GLUCOSE, UA: NEGATIVE
KETONES UA: NEGATIVE
Leukocytes, UA: NEGATIVE
NITRITE UA: NEGATIVE
PROTEIN UA: NEGATIVE
RBC, UA: NEGATIVE
SPEC GRAV UA: 1.006 (ref 1.005–1.030)
UUROB: 0.2 mg/dL (ref 0.2–1.0)
pH, UA: 6.5 (ref 5.0–7.5)

## 2018-05-06 LAB — OBSTETRIC PANEL, INCLUDING HIV
Antibody Screen: NEGATIVE
Basophils Absolute: 0.1 10*3/uL (ref 0.0–0.2)
Basos: 0 %
EOS (ABSOLUTE): 0.2 10*3/uL (ref 0.0–0.4)
Eos: 1 %
HIV Screen 4th Generation wRfx: NONREACTIVE
Hematocrit: 39.1 % (ref 34.0–46.6)
Hemoglobin: 13.2 g/dL (ref 11.1–15.9)
Hepatitis B Surface Ag: NEGATIVE
IMMATURE GRANULOCYTES: 0 %
Immature Grans (Abs): 0 10*3/uL (ref 0.0–0.1)
LYMPHS ABS: 2.7 10*3/uL (ref 0.7–3.1)
Lymphs: 22 %
MCH: 31.6 pg (ref 26.6–33.0)
MCHC: 33.8 g/dL (ref 31.5–35.7)
MCV: 94 fL (ref 79–97)
MONOS ABS: 0.6 10*3/uL (ref 0.1–0.9)
Monocytes: 5 %
Neutrophils Absolute: 8.3 10*3/uL — ABNORMAL HIGH (ref 1.4–7.0)
Neutrophils: 72 %
PLATELETS: 277 10*3/uL (ref 150–450)
RBC: 4.18 x10E6/uL (ref 3.77–5.28)
RDW: 12.5 % (ref 11.7–15.4)
RH TYPE: POSITIVE
RPR Ser Ql: NONREACTIVE
Rubella Antibodies, IGG: 9.13 index (ref >0.99)
WBC: 11.9 10*3/uL — AB (ref 3.4–10.8)

## 2018-05-06 LAB — URINE CULTURE

## 2018-05-06 LAB — GC/CHLAMYDIA PROBE AMP
Chlamydia trachomatis, NAA: NEGATIVE
NEISSERIA GONORRHOEAE BY PCR: NEGATIVE

## 2018-05-06 LAB — SICKLE CELL SCREEN: Sickle Cell Screen: NEGATIVE

## 2018-05-10 ENCOUNTER — Encounter: Payer: Self-pay | Admitting: Women's Health

## 2018-05-10 DIAGNOSIS — F1290 Cannabis use, unspecified, uncomplicated: Secondary | ICD-10-CM | POA: Insufficient documentation

## 2018-05-12 ENCOUNTER — Encounter: Payer: Self-pay | Admitting: Women's Health

## 2018-05-12 DIAGNOSIS — N809 Endometriosis, unspecified: Secondary | ICD-10-CM | POA: Insufficient documentation

## 2018-05-26 ENCOUNTER — Ambulatory Visit (INDEPENDENT_AMBULATORY_CARE_PROVIDER_SITE_OTHER): Payer: BLUE CROSS/BLUE SHIELD | Admitting: Obstetrics and Gynecology

## 2018-05-26 ENCOUNTER — Ambulatory Visit (INDEPENDENT_AMBULATORY_CARE_PROVIDER_SITE_OTHER): Payer: BLUE CROSS/BLUE SHIELD

## 2018-05-26 ENCOUNTER — Encounter: Payer: Self-pay | Admitting: Obstetrics and Gynecology

## 2018-05-26 VITALS — BP 119/78 | HR 93 | Temp 98.0°F | Wt 127.6 lb

## 2018-05-26 DIAGNOSIS — F17200 Nicotine dependence, unspecified, uncomplicated: Secondary | ICD-10-CM

## 2018-05-26 DIAGNOSIS — Z331 Pregnant state, incidental: Secondary | ICD-10-CM

## 2018-05-26 DIAGNOSIS — Z3402 Encounter for supervision of normal first pregnancy, second trimester: Secondary | ICD-10-CM

## 2018-05-26 DIAGNOSIS — Z363 Encounter for antenatal screening for malformations: Secondary | ICD-10-CM | POA: Diagnosis not present

## 2018-05-26 DIAGNOSIS — Z3A18 18 weeks gestation of pregnancy: Secondary | ICD-10-CM

## 2018-05-26 DIAGNOSIS — Z1389 Encounter for screening for other disorder: Secondary | ICD-10-CM

## 2018-05-26 LAB — POCT URINALYSIS DIPSTICK OB
Blood, UA: NEGATIVE
Glucose, UA: NEGATIVE
Leukocytes, UA: NEGATIVE
NITRITE UA: NEGATIVE
PROTEIN: NEGATIVE

## 2018-05-26 NOTE — Progress Notes (Signed)
[place] 18+1 wks,breech,posterior placenta gr 0,normal ovaries bilat,cx 2.9 cm,svp of fluid 4.7 cm,fhr 148 bpm,efw 223 g 41%,anatomy complete,no obvious abnormalities

## 2018-05-26 NOTE — Progress Notes (Signed)
Patient ID: NIJAYA BAUMEISTER, female   DOB: Mar 31, 1986, 33 y.o.   MRN: [id number]    LOW-RISK PREGNANCY VISIT Patient name: PENLEY LEGREE MRN [id number]  Date of birth: 10-30-1985 Chief Complaint:   Routine Prenatal Visit ([place] today; ears ache, sorethroat, stuffy nose )  History of Present Illness:   MELIZA BIERCE is a 33 y.o. G1P0 female at [redacted]w[redacted]d with an Estimated Date of Delivery: 10/26/18 being seen today for ongoing management of a low-risk pregnancy. Has stuffy nose,earache says that she sounds are muted and sore throat. She is nervous because she says that she gets this sick twice a year and develops bronchitis afterwards. Today she reports no complaints. Contractions: Not present. Vag. Bleeding: None.  Movement: Present. denies leaking of fluid. Review of Systems:   Pertinent items are noted in HPI Denies abnormal vaginal discharge w/ itching/odor/irritation, headaches, visual changes, shortness of breath, chest pain, abdominal pain, severe nausea/vomiting, or problems with urination or bowel movements unless otherwise stated above. Pertinent History Reviewed:  Reviewed past medical,surgical, social, obstetrical and family history.  Reviewed problem list, medications and allergies. Physical Assessment:   Vitals:   05/26/18 1612  BP: 119/78  Pulse: 93  Temp: 98 F (36.7 C)  Weight: 127 lb 9.6 oz (57.9 kg)  Body mass index is 22.6 kg/m.        Physical Examination:   General appearance: Well appearing, and in no distress  Mental status: Alert, oriented to person, place, and time  Skin: Warm & dry  Cardiovascular: Normal heart rate noted  Respiratory: Normal respiratory effort, no distress  Abdomen: Soft, gravid, nontender  Pelvic: Cervical exam deferred         Extremities: Edema: Trace  Fetal Status: Fetal Heart Rate (bpm): 148 u/s   Movement: Present    No results found for this or any previous visit (from the past 24 hour(s)).  Assessment & Plan:  1) Low-risk pregnancy  G1P0 at [redacted]w[redacted]d with an Estimated Date of Delivery: 10/26/18   2) Cold w/ sorethroat   Meds: No orders of the defined types were placed in this encounter.  Labs/procedures today: [place] 18+1 wks,breech,posterior placenta gr 0,normal ovaries bilat,cx 2.9 cm,svp of fluid 4.7 cm,fhr 148 bpm,efw 223 g 41%,anatomy complete,no obvious abnormalities   Plan:   1. Continue routine obstetrical care  2. Call for f/u if congestion moves to chest, robitussin DM 3. F/u in 4 weeks 4. Research child birthing classes 5. Dr. Note for work Friday & Saturday  Follow-up: Return in about 4 weeks (around 06/23/2018) for LROB.  No orders of the defined types were placed in this encounter.  By signing my name below, I, Arnette Norris, attest that this documentation has been prepared under the direction and in the presence of Tilda Burrow, MD. Electronically Signed: Arnette Norris Medical Scribe. 05/26/18. 4:21 PM.  I personally performed the services described in this documentation, which was SCRIBED in my presence. The recorded information has been reviewed and considered accurate. It has been edited as necessary during review. Tilda Burrow, MD

## 2018-06-08 ENCOUNTER — Telehealth: Payer: Self-pay | Admitting: *Deleted

## 2018-06-08 NOTE — Telephone Encounter (Signed)
Patient states she is certain she has cracked a rib due to coughing so much.  Cough is better but rib still hurts.  I informed her there was nothing that could be done for a cracked rib at this time but please advise if I need to tell her anything else.

## 2018-06-23 ENCOUNTER — Encounter: Payer: Self-pay | Admitting: Advanced Practice Midwife

## 2018-06-23 ENCOUNTER — Ambulatory Visit (INDEPENDENT_AMBULATORY_CARE_PROVIDER_SITE_OTHER): Payer: Self-pay | Admitting: Advanced Practice Midwife

## 2018-06-23 VITALS — BP 109/66 | HR 97 | Wt 138.0 lb

## 2018-06-23 DIAGNOSIS — Z1389 Encounter for screening for other disorder: Secondary | ICD-10-CM

## 2018-06-23 DIAGNOSIS — Z3A22 22 weeks gestation of pregnancy: Secondary | ICD-10-CM

## 2018-06-23 DIAGNOSIS — Z331 Pregnant state, incidental: Secondary | ICD-10-CM

## 2018-06-23 DIAGNOSIS — Z3402 Encounter for supervision of normal first pregnancy, second trimester: Secondary | ICD-10-CM

## 2018-06-23 LAB — POCT URINALYSIS DIPSTICK OB
GLUCOSE, UA: NEGATIVE
KETONES UA: NEGATIVE
Leukocytes, UA: NEGATIVE
Nitrite, UA: NEGATIVE
POC,PROTEIN,UA: NEGATIVE
RBC UA: NEGATIVE

## 2018-06-23 MED ORDER — OMEPRAZOLE 20 MG PO CPDR: 20.0000 mg | DELAYED_RELEASE_CAPSULE | Freq: Every day | ORAL | 3 refills | Status: DC

## 2018-06-23 NOTE — Progress Notes (Signed)
  G1P0 [redacted]w[redacted]d Estimated Date of Delivery: 10/26/18  Blood pressure 109/66, pulse 97, weight 138 lb (62.6 kg), last menstrual period 10/17/2017.   BP weight and urine results all reviewed and noted.  Please refer to the obstetrical flow sheet for the fundal height and fetal heart rate documentation:  Patient reports good fetal movement, denies any bleeding and no rupture of membranes symptoms or regular contractions. Patient is without complaints. All questions were answered.   Physical Assessment:   Vitals:   06/23/18 1530  BP: 109/66  Pulse: 97  Weight: 138 lb (62.6 kg)  Body mass index is 24.45 kg/m.        Physical Examination:   General appearance: Well appearing, and in no distress  Mental status: Alert, oriented to person, place, and time  Skin: Warm & dry  Cardiovascular: Normal heart rate noted  Respiratory: Normal respiratory effort, no distress  Abdomen: Soft, gravid, nontender  Pelvic: Cervical exam deferred         Extremities: Edema: Trace  Fetal Status:     Movement: Present    Results for orders placed or performed in visit on 06/23/18 (from the past 24 hour(s))  POC Urinalysis Dipstick OB   Collection Time: 06/23/18  3:40 PM  Result Value Ref Range   Color, UA     Clarity, UA     Glucose, UA Negative Negative   Bilirubin, UA     Ketones, UA neg    Spec Grav, UA     Blood, UA neg    pH, UA     POC,PROTEIN,UA Negative Negative, Trace, Small (1+), Moderate (2+), Large (3+), 4+   Urobilinogen, UA     Nitrite, UA neg    Leukocytes, UA Negative Negative   Appearance     Odor       Orders Placed This Encounter  Procedures  . POC Urinalysis Dipstick OB    Plan:  Continued routine obstetrical care,   Return in about 4 weeks (around 07/21/2018) for PN2/LROB.

## 2018-06-23 NOTE — Patient Instructions (Signed)

## 2018-07-20 ENCOUNTER — Telehealth: Payer: Self-pay | Admitting: *Deleted

## 2018-07-20 NOTE — Telephone Encounter (Signed)
Patient informed that we are not allowing visitors or children to come to appointments at this time. Patient denies any contact with anyone suspected or confirmed of having COVID-19. Pt denies fever, cough, sob, muscle pain, diarrhea, rash, vomiting, abdominal pain, red eye, weakness, bruising or bleeding, joint pain or severe headache.  

## 2018-07-21 ENCOUNTER — Other Ambulatory Visit: Payer: Self-pay

## 2018-07-21 ENCOUNTER — Encounter: Payer: Self-pay | Admitting: Women's Health

## 2018-07-21 ENCOUNTER — Ambulatory Visit (INDEPENDENT_AMBULATORY_CARE_PROVIDER_SITE_OTHER): Payer: BLUE CROSS/BLUE SHIELD | Admitting: Women's Health

## 2018-07-21 ENCOUNTER — Other Ambulatory Visit: Payer: BLUE CROSS/BLUE SHIELD

## 2018-07-21 VITALS — BP 102/65 | HR 98 | Temp 98.4°F | Wt 143.0 lb

## 2018-07-21 DIAGNOSIS — Z3A26 26 weeks gestation of pregnancy: Secondary | ICD-10-CM

## 2018-07-21 DIAGNOSIS — Z3402 Encounter for supervision of normal first pregnancy, second trimester: Secondary | ICD-10-CM | POA: Diagnosis not present

## 2018-07-21 DIAGNOSIS — Z1389 Encounter for screening for other disorder: Secondary | ICD-10-CM

## 2018-07-21 DIAGNOSIS — Z331 Pregnant state, incidental: Secondary | ICD-10-CM

## 2018-07-21 LAB — POCT URINALYSIS DIPSTICK OB
Blood, UA: NEGATIVE
Glucose, UA: NEGATIVE
Ketones, UA: NEGATIVE
Leukocytes, UA: NEGATIVE
Nitrite, UA: NEGATIVE
POC,PROTEIN,UA: NEGATIVE

## 2018-07-21 NOTE — Patient Instructions (Addendum)
Pamela Gallagher, I greatly value your feedback.  If you receive a survey following your visit with [place] today, we appreciate you taking the time to fill it out.  Thanks, Joellyn Haff, CNM, [facility]  [facility] HAS MOVED!!! It is now [facility] ([address] [place], [place] [address]) Entrance located off of [address] Free 24/7 valet parking   Tips to Help You Sleep Better:   Get into a bedtime routine, try to do the same thing every night before going to bed to try to help your body wind down  Warm baths  Avoid caffeine for at least 3 hours before going to sleep   Keep your room at a slightly cooler temperature, can try running a fan  Turn off TV, lights, phone, electronics  Lots of pillows if needed to help you get comfortable  Lavender scented items can help you sleep. You can place lavender essential oil on a cotton ball and place under your pillowcase, or place in a diffuser. [employer] has a lavender scented sleep line (plug-ins, sprays, etc). Look in the pillow aisle for lavender scented pillows.   If none of the above things help, you can try 1/2 to 1 tablet of benadryl, unisom, or tylenol pm. Do not take this every night, only when you really need it.      Call the office [phone]) or go to [facility] if:  You begin to have strong, frequent contractions  Your water breaks.  Sometimes it is a big gush of fluid, sometimes it is just a trickle that keeps getting your panties wet or running down your legs  You have vaginal bleeding.  It is normal to have a small amount of spotting if your cervix was checked.   You don't feel your baby moving like normal.  If you don't, get you something to eat and drink and lay down and focus on feeling your baby move.  You should feel at least 10 movements in 2 hours.  If you don't, you should call the office or go to [facility].    Tdap Vaccine  It is recommended that you get the Tdap  vaccine during the third trimester of EACH pregnancy to help protect your baby from getting pertussis (whooping cough)  27-36 weeks is the BEST time to do this so that you can pass the protection on to your baby. During pregnancy is better than after pregnancy, but if you are unable to get it during pregnancy it will be offered at the hospital.   You can get this vaccine with [place], at the health department, your family doctor, or some local pharmacies  Everyone who will be around your baby should also be up-to-date on their vaccines before the baby comes. Adults (who are not pregnant) only need 1 dose of Tdap during adulthood.   Third Trimester of Pregnancy The third trimester is from week 29 through week 42, months 7 through 9. The third trimester is a time when the fetus is growing rapidly. At the end of the ninth month, the fetus is about 20 inches in length and weighs 6-10 pounds.  BODY CHANGES Your body goes through many changes during pregnancy. The changes vary from woman to woman.   Your weight will continue to increase. You can expect to gain 25-35 pounds (11-16 kg) by the end of the pregnancy.  You may begin to get stretch marks on your hips, abdomen, and breasts.  You may urinate more often because the fetus is moving lower into your pelvis and pressing on your bladder.  You may develop or continue to have heartburn as a result of your pregnancy.  You may develop constipation because certain hormones are causing the muscles that push waste through your intestines to slow down.  You may develop hemorrhoids or swollen, bulging veins (varicose veins).  You may have pelvic pain because of the weight gain and pregnancy hormones relaxing your joints between the bones in your pelvis. Backaches may result from overexertion of the muscles supporting your posture.  You may have changes in your hair. These can include thickening of your hair, rapid growth, and changes in texture. Some women  also have hair loss during or after pregnancy, or hair that feels dry or thin. Your hair will most likely return to normal after your baby is born.  Your breasts will continue to grow and be tender. A yellow discharge may leak from your breasts called colostrum.  Your belly button may stick out.  You may feel short of breath because of your expanding uterus.  You may notice the fetus "dropping," or moving lower in your abdomen.  You may have a bloody mucus discharge. This usually occurs a few days to a week before labor begins.  Your cervix becomes thin and soft (effaced) near your due date. WHAT TO EXPECT AT YOUR PRENATAL EXAMS  You will have prenatal exams every 2 weeks until week 36. Then, you will have weekly prenatal exams. During a routine prenatal visit:  You will be weighed to make sure you and the fetus are growing normally.  Your blood pressure is taken.  Your abdomen will be measured to track your baby's growth.  The fetal heartbeat will be listened to.  Any test results from the previous visit will be discussed.  You may have a cervical check near your due date to see if you have effaced. At around 36 weeks, your caregiver will check your cervix. At the same time, your caregiver will also perform a test on the secretions of the vaginal tissue. This test is to determine if a type of bacteria, Group B streptococcus, is present. Your caregiver will explain this further. Your caregiver may ask you:  What your birth plan is.  How you are feeling.  If you are feeling the baby move.  If you have had any abnormal symptoms, such as leaking fluid, bleeding, severe headaches, or abdominal cramping.  If you have any questions. Other tests or screenings that may be performed during your third trimester include:  Blood tests that check for low iron levels (anemia).  Fetal testing to check the health, activity level, and growth of the fetus. Testing is done if you have certain  medical conditions or if there are problems during the pregnancy. FALSE LABOR You may feel small, irregular contractions that eventually go away. These are called Braxton Hicks contractions, or false labor. Contractions may last for hours, days, or even weeks before true labor sets in. If contractions come at regular intervals, intensify, or become painful, it is best to be seen by your caregiver.  SIGNS OF LABOR   Menstrual-like cramps.  Contractions that are 5 minutes apart or less.  Contractions that start on the top of the uterus and spread down to the lower abdomen and back.  A sense of increased pelvic pressure or back pain.  A watery or bloody mucus discharge that comes from the vagina. If  you have any of these signs before the 37th week of pregnancy, call your caregiver right away. You need to go to the hospital to get checked immediately. HOME CARE INSTRUCTIONS   Avoid all smoking, herbs, alcohol, and unprescribed drugs. These chemicals affect the formation and growth of the baby.  Follow your caregiver's instructions regarding medicine use. There are medicines that are either safe or unsafe to take during pregnancy.  Exercise only as directed by your caregiver. Experiencing uterine cramps is a good sign to stop exercising.  Continue to eat regular, healthy meals.  Wear a good support bra for breast tenderness.  Do not use hot tubs, steam rooms, or saunas.  Wear your seat belt at all times when driving.  Avoid raw meat, uncooked cheese, cat litter boxes, and soil used by cats. These carry germs that can cause birth defects in the baby.  Take your prenatal vitamins.  Try taking a stool softener (if your caregiver approves) if you develop constipation. Eat more high-fiber foods, such as fresh vegetables or fruit and whole grains. Drink plenty of fluids to keep your urine clear or pale yellow.  Take warm sitz baths to soothe any pain or discomfort caused by hemorrhoids. Use  hemorrhoid cream if your caregiver approves.  If you develop varicose veins, wear support hose. Elevate your feet for 15 minutes, 3-4 times a day. Limit salt in your diet.  Avoid heavy lifting, wear low heal shoes, and practice good posture.  Rest a lot with your legs elevated if you have leg cramps or low back pain.  Visit your dentist if you have not gone during your pregnancy. Use a soft toothbrush to brush your teeth and be gentle when you floss.  A sexual relationship may be continued unless your caregiver directs you otherwise.  Do not travel far distances unless it is absolutely necessary and only with the approval of your caregiver.  Take prenatal classes to understand, practice, and ask questions about the labor and delivery.  Make a trial run to the hospital.  Pack your hospital bag.  Prepare the baby's nursery.  Continue to go to all your prenatal visits as directed by your caregiver. SEEK MEDICAL CARE IF:  You are unsure if you are in labor or if your water has broken.  You have dizziness.  You have mild pelvic cramps, pelvic pressure, or nagging pain in your abdominal area.  You have persistent nausea, vomiting, or diarrhea.  You have a bad smelling vaginal discharge.  You have pain with urination. SEEK IMMEDIATE MEDICAL CARE IF:   You have a fever.  You are leaking fluid from your vagina.  You have spotting or bleeding from your vagina.  You have severe abdominal cramping or pain.  You have rapid weight loss or gain.  You have shortness of breath with chest pain.  You notice sudden or extreme swelling of your face, hands, ankles, feet, or legs.  You have not felt your baby move in over an hour.  You have severe headaches that do not go away with medicine.  You have vision changes. Document Released: 04/01/2001 Document Revised: 04/12/2013 Document Reviewed: 06/08/2012 [facility] Patient Information 2015 [place], [place]. This information is not  intended to replace advice given to you by your health care provider. Make sure you discuss any questions you have with your health care provider.

## 2018-07-21 NOTE — Progress Notes (Signed)
   LOW-RISK PREGNANCY VISIT Patient name: Pamela Gallagher MRN [id number]  Date of birth: 26-Feb-1986 Chief Complaint:   Routine Prenatal Visit (PN2/ last 2 weeks feet, hands swelling)  History of Present Illness:   Pamela Gallagher is a 33 y.o. G1P0 female at [redacted]w[redacted]d with an Estimated Date of Delivery: 10/26/18 being seen today for ongoing management of a low-risk pregnancy.  Today she reports hands/legs swelling, hard to squat at work. Intermittent Lt sided SP pain- makes it hard to stand. Contractions: Not present.  .  Movement: Present. denies leaking of fluid. Review of Systems:   Pertinent items are noted in HPI Denies abnormal vaginal discharge w/ itching/odor/irritation, headaches, visual changes, shortness of breath, chest pain, abdominal pain, severe nausea/vomiting, or problems with urination or bowel movements unless otherwise stated above. Pertinent History Reviewed:  Reviewed past medical,surgical, social, obstetrical and family history.  Reviewed problem list, medications and allergies. Physical Assessment:   Vitals:   07/21/18 0913  BP: 102/65  Pulse: 98  Temp: 98.4 F (36.9 C)  Weight: 143 lb (64.9 kg)  Body mass index is 25.33 kg/m.        Physical Examination:   General appearance: Well appearing, and in no distress  Mental status: Alert, oriented to person, place, and time  Skin: Warm & dry  Cardiovascular: Normal heart rate noted  Respiratory: Normal respiratory effort, no distress  Abdomen: Soft, gravid, nontender  Pelvic: Cervical exam deferred         Extremities: Edema: Trace  Fetal Status: Fetal Heart Rate (bpm): 133 Fundal Height: 26 cm Movement: Present    Results for orders placed or performed in visit on 07/21/18 (from the past 24 hour(s))  POC Urinalysis Dipstick OB   Collection Time: 07/21/18  9:51 AM  Result Value Ref Range   Color, UA     Clarity, UA     Glucose, UA Negative Negative   Bilirubin, UA     Ketones, UA neg    Spec Grav, UA     Blood, UA neg    pH, UA     POC,PROTEIN,UA Negative Negative, Trace, Small (1+), Moderate (2+), Large (3+), 4+   Urobilinogen, UA     Nitrite, UA neg    Leukocytes, UA Negative Negative   Appearance     Odor      Assessment & Plan:  1) Low-risk pregnancy G1P0 at [redacted]w[redacted]d with an Estimated Date of Delivery: 10/26/18   2) Swelling, get compression stockings  3) Lt SP pain> can try chiropractor  4) Baby[employer] Optimized Pt> is checking BP's weekly as directed. Next appt at 30wks -televisit d/t coronavirus   Meds: No orders of the defined types were placed in this encounter.  Labs/procedures today: pn2, too early for tdap- will do at next in-person visit  Plan:  Continue routine obstetrical care   Reviewed: Preterm labor symptoms and general obstetric precautions including but not limited to vaginal bleeding, contractions, leaking of fluid and fetal movement were reviewed in detail with the patient.  All questions were answered  Follow-up: Return in about 4 weeks (around 08/18/2018) for tele visit. d/t coronavirus  Orders Placed This Encounter  Procedures  . POC Urinalysis Dipstick OB   Cheral Marker CNM, [facility] 07/21/2018 9:53 AM

## 2018-07-22 LAB — CBC
Hematocrit: 34 % (ref 34.0–46.6)
Hemoglobin: 12 g/dL (ref 11.1–15.9)
MCH: 33.1 pg — AB (ref 26.6–33.0)
MCHC: 35.3 g/dL (ref 31.5–35.7)
MCV: 94 fL (ref 79–97)
Platelets: 266 10*3/uL (ref 150–450)
RBC: 3.62 x10E6/uL — ABNORMAL LOW (ref 3.77–5.28)
RDW: 12.1 % (ref 11.7–15.4)
WBC: 13.3 10*3/uL — ABNORMAL HIGH (ref 3.4–10.8)

## 2018-07-22 LAB — GLUCOSE TOLERANCE, 2 HOURS W/ 1HR
GLUCOSE, 1 HOUR: 145 mg/dL (ref 65–179)
GLUCOSE, FASTING: 72 mg/dL (ref 65–91)
Glucose, 2 hour: 127 mg/dL (ref 65–152)

## 2018-07-22 LAB — RPR: RPR: NONREACTIVE

## 2018-07-22 LAB — HIV ANTIBODY (ROUTINE TESTING W REFLEX): HIV SCREEN 4TH GENERATION: NONREACTIVE

## 2018-07-22 LAB — ANTIBODY SCREEN: Antibody Screen: NEGATIVE

## 2018-08-03 ENCOUNTER — Ambulatory Visit (INDEPENDENT_AMBULATORY_CARE_PROVIDER_SITE_OTHER): Payer: BLUE CROSS/BLUE SHIELD | Admitting: Obstetrics & Gynecology

## 2018-08-03 ENCOUNTER — Encounter: Payer: Self-pay | Admitting: Obstetrics & Gynecology

## 2018-08-03 ENCOUNTER — Telehealth: Payer: Self-pay | Admitting: Women's Health

## 2018-08-03 ENCOUNTER — Other Ambulatory Visit: Payer: Self-pay

## 2018-08-03 VITALS — BP 109/74 | HR 85 | Temp 98.0°F | Wt 144.2 lb

## 2018-08-03 DIAGNOSIS — Z331 Pregnant state, incidental: Secondary | ICD-10-CM

## 2018-08-03 DIAGNOSIS — Z1389 Encounter for screening for other disorder: Secondary | ICD-10-CM

## 2018-08-03 DIAGNOSIS — Z3403 Encounter for supervision of normal first pregnancy, third trimester: Secondary | ICD-10-CM

## 2018-08-03 LAB — POCT URINALYSIS DIPSTICK OB
Blood, UA: NEGATIVE
Glucose, UA: NEGATIVE
Ketones, UA: NEGATIVE
Leukocytes, UA: NEGATIVE
Nitrite, UA: NEGATIVE
POC,PROTEIN,UA: NEGATIVE

## 2018-08-03 MED ORDER — CYCLOBENZAPRINE HCL 10 MG PO TABS: 10.0000 mg | ORAL_TABLET | Freq: Three times a day (TID) | ORAL | 1 refills | Status: DC | PRN

## 2018-08-03 NOTE — Telephone Encounter (Signed)
Patient called stated that was here 07/21/2018, she's in a lot of pain.  Would like a call back.  [phone]

## 2018-08-03 NOTE — Telephone Encounter (Signed)
Pt aware she needs an appt today per Selena Batten B. Pt to come in at 11:45 per [facility]. JSY

## 2018-08-03 NOTE — Telephone Encounter (Signed)
Spoke with pt. Pt is having bad pain in left side of labia that goes into bend of left leg. Very tender to touch. Having trouble sleeping. Took a Tylenol PM last pm and that helped a little. Pt was seen on 4/1 and mentioned it but pain is getting worse. Please advise. Thanks!! JSY

## 2018-08-03 NOTE — Progress Notes (Signed)
Chief Complaint  Patient presents with  . work-in-ob    bad pain left side labia that goes to bend left leg, since end March      33 y.o. G1P0 Patient's last menstrual period was 10/17/2017. The current method of family planning is pregnant.  Outpatient Encounter Medications as of 08/03/2018  Medication Sig Note  . cholecalciferol (VITAMIN D3) 25 MCG (1000 UT) tablet Take 4,000 Units by mouth daily.   . Prenatal Vit-Fe Fumarate-FA (PRENATAL VITAMIN PO) Take by mouth.   . [DISCONTINUED] omeprazole (PRILOSEC) 20 MG capsule Take 1 capsule (20 mg total) by mouth daily.   Marland Kitchen albuterol (PROVENTIL HFA;VENTOLIN HFA) 108 (90 BASE) MCG/ACT inhaler Inhale 2 puffs into the lungs every 6 (six) hours as needed. Wheezing (Patient not taking: Reported on 09/16/2018)   . ipratropium-albuterol (DUONEB) 0.5-2.5 (3) MG/3ML SOLN Inhale 3 mLs into the lungs every 4 (four) hours as needed (FOR WHEEZING AND SHORTNESS OF BREATH).  09/15/2015: Received from: External Pharmacy  . [DISCONTINUED] Ca Carbonate-Mag Hydroxide (ROLAIDS PO) Take by mouth as needed.   . [DISCONTINUED] Calcium Carbonate Antacid (TUMS PO) Take by mouth. Three times a week   . [DISCONTINUED] cyclobenzaprine (FLEXERIL) 10 MG tablet Take 1 tablet (10 mg total) by mouth every 8 (eight) hours as needed for muscle spasms. (Patient not taking: Reported on 10/13/2018)   . [DISCONTINUED] ondansetron (ZOFRAN) 4 MG tablet Take 1 tablet (4 mg total) by mouth every 8 (eight) hours as needed for nausea or vomiting. (Patient not taking: Reported on 06/23/2018)    No facility-administered encounter medications on file as of 08/03/2018.     Subjective Pt with worsening pain radiating from back down inner left leg thigh No bleeding good movement Past Medical History:  Diagnosis Date  . Abnormal Pap smear 2011  . Anxiety and depression   . Asthma   . Endometriosis   . PONV (postoperative nausea and vomiting)   . PTSD (post-traumatic stress disorder)     "adultified child"  . UTI (lower urinary tract infection)   . Vaginal Pap smear, abnormal     Past Surgical History:  Procedure Laterality Date  . BIOPSY  11/27/2014   Procedure: BIOPSY of endometriosis;  Surgeon: Osborn Coho, MD;  Location: [facility];  Service: Gynecology;;  . Christianne Borrow  11/27/2014   Procedure: CHROMOPERTUBATION;  Surgeon: Osborn Coho, MD;  Location: [facility];  Service: Gynecology;;  . LAPAROSCOPY N/A 11/27/2014   Procedure: LAPAROSCOPY OPERATIVE;  Surgeon: Osborn Coho, MD;  Location: [facility];  Service: Gynecology;  Laterality: N/A;  . thyroglossoduct cyst     x 2  . WISDOM TOOTH EXTRACTION      OB History as of 10/27/2018    Gravida  1   Para  1   Term  1   Preterm      AB      Living  1     SAB      TAB      Ectopic      Multiple  0   Live Births  1           Allergies  Allergen Reactions  . Clindamycin/Lincomycin Shortness Of Breath  . Penicillins Anaphylaxis    Has patient had a PCN reaction causing immediate rash, facial/tongue/throat swelling, SOB or lightheadedness with hypotension: Yes Has patient had a PCN reaction causing severe rash involving mucus membranes or skin necrosis: Yes Has patient had a PCN reaction that required hospitalization Yes Has  patient had a PCN reaction occurring within the last 10 years: No If all of the above answers are "NO", then may proceed with Cephalosporin use.   . Sulfa Antibiotics Anaphylaxis    Social History   Socioeconomic History  . Marital status: Single    Spouse name: Gerilyn Pilgrim  . Number of children: Not on file  . Years of education: Not on file  . Highest education level: Not on file  Occupational History  . Not on file  Social Needs  . Financial resource strain: Not hard at all  . Food insecurity    Worry: Never true    Inability: Never true  . Transportation needs    Medical: No    Non-medical: No  Tobacco Use  . Smoking status: Former Smoker    Packs/day: 0.25     Years: 0.50    Pack years: 0.12    Types: Cigarettes    Quit date: 05/31/2018    Years since quitting: 0.4  . Smokeless tobacco: Never Used  . Tobacco comment: 3 cigs per day  Substance and Sexual Activity  . Alcohol use: Not Currently    Comment: social  . Drug use: No  . Sexual activity: Yes    Birth control/protection: None  Lifestyle  . Physical activity    Days per week: 2 days    Minutes per session: 40 min  . Stress: Only a little  Relationships  . Social connections    Talks on phone: More than three times a week    Gets together: Once a week    Attends religious service: Never    Active member of club or organization: No    Attends meetings of clubs or organizations: Never    Relationship status: Living with partner  Other Topics Concern  . Not on file  Social History Narrative  . Not on file    Family History  Problem Relation Age of Onset  . Depression Mother   . Anxiety disorder Mother   . Depression Father   . Hearing loss Paternal Uncle   . Aneurysm Maternal Aunt   . COPD Maternal Grandmother   . Heart disease Paternal Grandfather     Medications:       Current Outpatient Medications:  .  cholecalciferol (VITAMIN D3) 25 MCG (1000 UT) tablet, Take 4,000 Units by mouth daily., Disp: , Rfl:  .  Prenatal Vit-Fe Fumarate-FA (PRENATAL VITAMIN PO), Take by mouth., Disp: , Rfl:  .  albuterol (PROVENTIL HFA;VENTOLIN HFA) 108 (90 BASE) MCG/ACT inhaler, Inhale 2 puffs into the lungs every 6 (six) hours as needed. Wheezing (Patient not taking: Reported on 09/16/2018), Disp: , Rfl:  .  butalbital-acetaminophen-caffeine (FIORICET) 50-325-40 MG tablet, Take 1 tablet by mouth every 4 (four) hours as needed for headache or migraine., Disp: 20 tablet, Rfl: 0 .  cyclobenzaprine (FLEXERIL) 10 MG tablet, Take 1 tablet (10 mg total) by mouth every 8 (eight) hours as needed for muscle spasms., Disp: 30 tablet, Rfl: 1 .  hydrOXYzine (ATARAX/VISTARIL) 25 MG tablet, Take 1 tablet  (25 mg total) by mouth 3 (three) times daily as needed for anxiety., Disp: 30 tablet, Rfl: 0 .  ibuprofen (ADVIL) 600 MG tablet, Take 1 tablet (600 mg total) by mouth every 6 (six) hours., Disp: 30 tablet, Rfl: 0 .  ipratropium-albuterol (DUONEB) 0.5-2.5 (3) MG/3ML SOLN, Inhale 3 mLs into the lungs every 4 (four) hours as needed (FOR WHEEZING AND SHORTNESS OF BREATH). , Disp: , Rfl:  .  norethindrone (MICRONOR) 0.35 MG tablet, Take 1 tablet (0.35 mg total) by mouth daily., Disp: 1 Package, Rfl: 11 .  omeprazole (PRILOSEC) 20 MG capsule, TAKE 1 CAPSULE BY MOUTH EVERY DAY, Disp: 30 capsule, Rfl: 3 .  sertraline (ZOLOFT) 25 MG tablet, Take 1 tablet (25 mg total) by mouth daily., Disp: 30 tablet, Rfl: 3  Objective Blood pressure 109/74, pulse 85, temperature 98 F (36.7 C), weight 144 lb 3.2 oz (65.4 kg), last menstrual period 10/17/2017, unknown if currently breastfeeding.  +triggers left SI/back  Pertinent ROS   Labs or studies     Impression Diagnoses this Encounter::   ICD-10-CM   1. Encounter for supervision of normal first pregnancy in third trimester  Z34.03   2. Pregnant state, incidental  Z33.1 POC Urinalysis Dipstick OB  3. Screening for genitourinary condition  Z13.89 POC Urinalysis Dipstick OB    Established relevant diagnosis(es):   Plan/Recommendations: Meds ordered this encounter  Medications  . DISCONTD: cyclobenzaprine (FLEXERIL) 10 MG tablet    Sig: Take 1 tablet (10 mg total) by mouth every 8 (eight) hours as needed for muscle spasms.    Dispense:  30 tablet    Refill:  1    Labs or Scans Ordered: Orders Placed This Encounter  Procedures  . POC Urinalysis Dipstick OB    Management:: >spasm casuing downstream neurogenic pain symptoms >flexeril and local care exercises given  Follow up Return for keep televisit as scheduled.     All questions were answered.

## 2018-08-17 ENCOUNTER — Encounter: Payer: Self-pay | Admitting: *Deleted

## 2018-08-18 ENCOUNTER — Other Ambulatory Visit: Payer: Self-pay

## 2018-08-18 ENCOUNTER — Encounter: Payer: Self-pay | Admitting: Women's Health

## 2018-08-18 ENCOUNTER — Ambulatory Visit (INDEPENDENT_AMBULATORY_CARE_PROVIDER_SITE_OTHER): Payer: BLUE CROSS/BLUE SHIELD | Admitting: Women's Health

## 2018-08-18 DIAGNOSIS — Z3403 Encounter for supervision of normal first pregnancy, third trimester: Secondary | ICD-10-CM

## 2018-08-18 DIAGNOSIS — Z3A30 30 weeks gestation of pregnancy: Secondary | ICD-10-CM

## 2018-08-18 NOTE — Patient Instructions (Signed)
Pamela Gallagher, I greatly value your feedback.  If you receive a survey following your visit with [place] today, we appreciate you taking the time to fill it out.  Thanks, Joellyn Haff, CNM, [facility]  [facility] HOSPITAL HAS MOVED!!! It is now [facility] ([address] [place], [place] [address]) Entrance located off of [address] Free 24/7 valet parking    Call the office [phone]) or go to [facility] if:  You begin to have strong, frequent contractions  Your water breaks.  Sometimes it is a big gush of fluid, sometimes it is just a trickle that keeps getting your panties wet or running down your legs  You have vaginal bleeding.  It is normal to have a small amount of spotting if your cervix was checked.   You don't feel your baby moving like normal.  If you don't, get you something to eat and drink and lay down and focus on feeling your baby move.  You should feel at least 10 movements in 2 hours.  If you don't, you should call the office or go to [facility]   Tips to Help You Sleep Better:   Get into a bedtime routine, try to do the same thing every night before going to bed to try to help your body wind down  Warm baths  Avoid caffeine for at least 3 hours before going to sleep   Keep your room at a slightly cooler temperature, can try running a fan  Turn off TV, lights, phone, electronics  Lots of pillows if needed to help you get comfortable  Lavender scented items can help you sleep. You can place lavender essential oil on a cotton ball and place under your pillowcase, or place in a diffuser. [employer] has a lavender scented sleep line (plug-ins, sprays, etc). Look in the pillow aisle for lavender scented pillows.   If none of the above things help, you can try 1/2 to 1 tablet of benadryl, unisom, or tylenol pm. Do not take this every night, only when you really need it.    For your lower back pain you may:  Purchase a pregnancy belt  from [employer], [employer], [employer], etc and wear it while you are up and about  Take warm baths  Use a heating pad to your lower back for no longer than 20 minutes at a time, and do not place near abdomen  Take tylenol as needed. Please follow directions on the bottle  Kinesiology tape (can get from sporting goods store), google how to tape belly for pregnancy   [place] Pediatricians/Family Doctors:  [employer] [phone]            [facility] [phone]                 [facility] [phone] (usually not accepting new patients unless you have family there already, you are always welcome to call and ask)       [facility] Department [phone]       [facility] Pediatricians/Family Doctors:   [facility]: [phone]  [facility]: [phone]  [facility]: [phone]  [facility] Doctors:   [facility]: [phone]   [facility]: [phone]  [facility] Doctors:  [facility]: [phone]   AREA PEDIATRIC/FAMILY PRACTICE PHYSICIANS  [facility] [address] [place], [place] [address] Phone - [phone]   Fax -  [phone]  JACK AMOS [address] [place], [place]  [address] Phone - [phone]   Fax - [phone]  [facility] [address] [place], [place]  [address] Phone - [phone]   Fax - [phone]  [facility] [address] [place], [place]  [address] Phone - [phone]   Fax - [phone]  [facility] [address] [place], [place]  [address] Phone - [phone]   Fax - [phone]  [facility] [address], [address] [place], [place]  [address] Phone - [phone]   Fax - [phone]  [facility] [address], [address] [place], [place]  [address] Phone - [phone]   Fax - [phone]  [facility] [address] [facility] [address] [place], [place]  [address] Phone - [phone]   Fax - [phone]  [facility] [address] [place], [place]  [address] Phone - [phone]   Fax - [phone] [facility] [place] [address] [place], [place]  [address] Phone - [phone]   Fax - [phone]  [facility] [address] [place], [place]  [address] Phone - [phone]   Fax - [phone]  [facility] [address], [address] [place], [place]  [address] Phone - [phone]   Fax - [phone]  [facility] [address] [place], [place]  [address] Phone - [phone]   Fax - [phone]  [facility] [address], [address] [place], [place]  [address] Phone - [phone]  [facility] [address] [place], [place]  [address] Phone - [phone]   Fax - [phone]  [facility] [address], [address] [place], [place]  [address] Phone - [phone]   Fax - [phone]  [facility] [address] [place], [place]  [address] Phone - [phone]   Fax - [phone]  [facility] [address] [place], [place]  [address] Phone - [phone]   Fax - [phone]   [facility] [address], [address] [place], [place]  [address] Phone - [phone]   Fax - [phone]  [facility] [address], [address] [place], [place]  [address] Phone - [phone]   Fax - [phone]  DAVID RUBIN [address] [place], [place]  [address] Phone - [phone]   Fax - [phone]  [facility] [address] [place], [place]  [address] Phone - [phone]   Fax - [phone]  [facility] [address] [place], [place]  [address] Phone - [phone]   Fax - [phone] [facility] [place] [address] [place], [place]  [address] Phone - [phone]   Fax - [phone]  [facility] [address] [place], [place]  [address] Phone - [phone]   Fax - [phone]  [facility] [place] [address] [place], [place]  [address] Phone - [phone]   Fax - [phone]   Home Blood Pressure Monitoring for Patients   Your provider has recommended that you check your blood pressure (BP) at least once a week at home. If you do not have a blood pressure cuff at home, one will be provided for you. Contact your provider if you have not received your monitor within 1 week.  Helpful Tips for Accurate Home Blood Pressure Checks   Don't smoke, exercise, or drink caffeine 30 minutes before checking your BP  Use the restroom before checking your BP (a full bladder can raise your pressure)  Relax in a comfortable upright chair  Feet on the ground  Left arm resting comfortably on a flat surface at the level of your heart  Legs uncrossed  Back supported  Sit quietly and don't talk  Place the cuff on your bare arm  Adjust snuggly, so that only two fingertips can fit between your skin and the top of the cuff  Check 2 readings separated by at least one minute  Keep a log of your BP readings  For a visual, please reference this diagram: http://ccnc.care/bpdiagram  Provider Name: [facility]     Phone: [phone]  Zone 1: ALL CLEAR  Continue to monitor your symptoms:   BP reading is less than 140 (top number) or less than 90 (bottom number)   No right upper stomach pain  No headaches or seeing spots  No feeling nauseated or throwing up  No swelling in face and hands  Zone 2: CAUTION Call your doctor's office for any of the following:   BP reading is greater than 140 (top  number) or greater than 90 (bottom number)   Stomach pain under your ribs in the middle or right side  Headaches or seeing spots  Feeling nauseated or throwing up  Swelling in face and hands  Zone 3: EMERGENCY  Seek immediate medical care if you have any of the following:   BP reading is greater than160 (top number) or greater than 110 (bottom number)  Severe headaches not improving with Tylenol  Serious difficulty catching your breath  Any worsening symptoms from Zone 2    Preterm Labor and Birth Information  The normal length of a pregnancy is 39-41 weeks. Preterm labor is when labor starts before 37 completed weeks of pregnancy. What are the risk factors for preterm labor? Preterm labor is more likely to occur in women who:  Have certain infections during pregnancy such as a bladder infection, sexually transmitted infection, or infection inside the uterus (chorioamnionitis).  Have a shorter-than-normal cervix.  Have gone into preterm labor before.  Have had surgery on their cervix.  Are younger than age 38 or older than age 61.  Are African American.  Are pregnant with twins or multiple babies (multiple gestation).  Take street drugs or smoke while pregnant.  Do not gain enough weight while pregnant.  Became pregnant shortly after having been pregnant. What are the symptoms of preterm labor? Symptoms of preterm labor include:  Cramps similar to those that can happen during a menstrual period. The cramps may happen with diarrhea.  Pain in the abdomen or lower back.  Regular uterine contractions that may feel like tightening of the abdomen.  A feeling of increased pressure in the pelvis.  Increased watery or bloody mucus discharge from the vagina.  Water breaking (ruptured amniotic sac). Why is it important to recognize signs of preterm labor? It is important to recognize signs of preterm labor because babies who are born prematurely may not be fully  developed. This can put them at an increased risk for:  Long-term (chronic) heart and lung problems.  Difficulty immediately after birth with regulating body systems, including blood sugar, body temperature, heart rate, and breathing rate.  Bleeding in the brain.  Cerebral palsy.  Learning difficulties.  Death. These risks are  highest for babies who are born before 34 weeks of pregnancy. How is preterm labor treated? Treatment depends on the length of your pregnancy, your condition, and the health of your baby. It may involve:  Having a stitch (suture) placed in your cervix to prevent your cervix from opening too early (cerclage).  Taking or being given medicines, such as: ? Hormone medicines. These may be given early in pregnancy to help support the pregnancy. ? Medicine to stop contractions. ? Medicines to help mature the babys lungs. These may be prescribed if the risk of delivery is high. ? Medicines to prevent your baby from developing cerebral palsy. If the labor happens before 34 weeks of pregnancy, you may need to stay in the hospital. What should I do if I think I am in preterm labor? If you think that you are going into preterm labor, call your health care provider right away. How can I prevent preterm labor in future pregnancies? To increase your chance of having a full-term pregnancy:  Do not use any tobacco products, such as cigarettes, chewing tobacco, and e-cigarettes. If you need help quitting, ask your health care provider.  Do not use street drugs or medicines that have not been prescribed to you during your pregnancy.  Talk with your health care provider before taking any herbal supplements, even if you have been taking them regularly.  Make sure you gain a healthy amount of weight during your pregnancy.  Watch for infection. If you think that you might have an infection, get it checked right away.  Make sure to tell your health care provider if you have  gone into preterm labor before. This information is not intended to replace advice given to you by your health care provider. Make sure you discuss any questions you have with your health care provider. Document Released: 06/28/2003 Document Revised: 09/18/2015 Document Reviewed: 08/29/2015 Elsevier Interactive Patient Education  2019 [employer].  Coronavirus (COVID-19) Are you at risk?  Are you at risk for the Coronavirus (COVID-19)?  To be considered HIGH RISK for Coronavirus (COVID-19), you have to meet the following criteria:   Traveled to [place], [place], [place], [place] or [place]; or in the [place] to [place], [place], [place], or [place]; and have fever, cough, and shortness of breath within the last 2 weeks of travel OR  Been in close contact with a person diagnosed with COVID-19 within the last 2 weeks and have fever, cough, and shortness of breath  IF YOU DO NOT MEET THESE CRITERIA, YOU ARE CONSIDERED LOW RISK FOR COVID-19.  What to do if you are HIGH RISK for COVID-19?   If you are having a medical emergency, call 911.  Seek medical care right away. Before you go to a doctors office, urgent care or emergency department, call ahead and tell them about your recent travel, contact with someone diagnosed with COVID-19, and your symptoms. You should receive instructions from your physicians office regarding next steps of care.   When you arrive at healthcare provider, tell the healthcare staff immediately you have returned from visiting [place], [place], [place], [place] or [place]; or traveled in the [place] to [place], [place], [place], or [place]; in the last two weeks or you have been in close contact with a person diagnosed with COVID-19 in the last 2 weeks.    Tell the health care staff about your symptoms: fever, cough and shortness of breath.  After you have been seen  by a medical provider, you will be either: o Tested for (COVID-19) and  discharged home on quarantine except to seek medical care if symptoms worsen, and asked to  - Stay home and avoid contact with others until you get your results (4-5 days)  - Avoid travel on public transportation if possible (such as bus, train, or airplane) or o Sent to the Emergency Department by EMS for evaluation, COVID-19 testing, and possible admission depending on your condition and test results.  What to do if you are LOW RISK for COVID-19?  Reduce your risk of any infection by using the same precautions used for avoiding the common cold or flu:   Wash your hands often with soap and warm water for at least 20 seconds.  If soap and water are not readily available, use an alcohol-based hand sanitizer with at least 60% alcohol.   If coughing or sneezing, cover your mouth and nose by coughing or sneezing into the elbow areas of your shirt or coat, into a tissue or into your sleeve (not your hands).  Avoid shaking hands with others and consider head nods or verbal greetings only.  Avoid touching your eyes, nose, or mouth with unwashed hands.   Avoid close contact with people who are sick.  Avoid places or events with large numbers of people in one location, like concerts or sporting events.  Carefully consider travel plans you have or are making.  If you are planning any travel outside or inside the [place], visit the [facility] [employer] [facility] webpage for the latest health notices.  If you have some symptoms but not all symptoms, continue to monitor at home and seek medical attention if your symptoms worsen.  If you are having a medical emergency, call 911.   ADDITIONAL HEALTHCARE OPTIONS FOR PATIENTS  [place] [facility] / e-Visit: [email or web address]         [facility]: [phone]  [facility]: [phone]                   [facility]: [phone]

## 2018-08-18 NOTE — Progress Notes (Signed)
   TELEHEALTH VIRTUAL OBSTETRICS VISIT ENCOUNTER NOTE Patient name: Pamela Gallagher MRN [id number]  Date of birth: [birth year]  I connected with patient on 08/18/18 at  4:00 PM EDT by [facility] and verified that I am speaking with the correct person using two identifiers. Due to COVID-19 recommendations, pt is not currently in our office.    I discussed the limitations, risks, security and privacy concerns of performing an evaluation and management service by telephone and the availability of in person appointments. I also discussed with the patient that there may be a patient responsible charge related to this service. The patient expressed understanding and agreed to proceed.  Chief Complaint:   Routine Prenatal Visit  History of Present Illness:   Pamela Gallagher is a 33 y.o. G1P0 female at [redacted]w[redacted]d with an Estimated Date of Delivery: 10/26/18 being evaluated today for ongoing management of a low-risk pregnancy.  Today she reports low back pain, trouble sleeping. Contractions: Not present. Vag. Bleeding: None.  Movement: Present. denies leaking of fluid. Review of Systems:   Pertinent items are noted in HPI Denies abnormal vaginal discharge w/ itching/odor/irritation, headaches, visual changes, shortness of breath, chest pain, abdominal pain, severe nausea/vomiting, or problems with urination or bowel movements unless otherwise stated above. Pertinent History Reviewed:  Reviewed past medical,surgical, social, obstetrical and family history.  Reviewed problem list, medications and allergies. Physical Assessment:   Vitals:   08/18/18 1534  BP: 111/63  Pulse: (!) 108  Weight: 144 lb 3.2 oz (65.4 kg)  Body mass index is 25.54 kg/m.        Physical Examination:   General:  Alert, oriented and cooperative.   Mental Status: Normal mood and affect perceived. Normal judgment and thought content.  Rest of physical exam deferred due to type of encounter  No results found for this or any previous visit  (from the past 24 hour(s)).  Assessment & Plan:  1) Pregnancy G1P0 at [redacted]w[redacted]d with an Estimated Date of Delivery: 10/26/18   2) Low back pain, gave printed prevention/relief measures   3) Trouble sleeping> gave printed prevention/relief measures    Meds: No orders of the defined types were placed in this encounter.  Labs/procedures today: none  Plan:  Continue routine obstetrical care. Has home BP cuff. Check bp weekly, let us know if >140/90.   Reviewed: Preterm labor symptoms and general obstetric precautions including but not limited to vaginal bleeding, contractions, leaking of fluid and fetal movement were reviewed in detail with the patient. The patient was advised to call back or seek an in-person office evaluation/go to [facility] at [facility] for any urgent or concerning symptoms. All questions were answered. Please refer to After Visit Summary for other counseling recommendations.   I provided 10 minutes of non-face-to-face time during this encounter.  Follow-up: Return in about 2 weeks (around 09/01/2018) for LROB webex.  No orders of the defined types were placed in this encounter.  Cheral Marker CNM, [facility] 08/18/2018 3:57 PM

## 2018-08-23 ENCOUNTER — Encounter: Payer: Self-pay | Admitting: *Deleted

## 2018-08-23 ENCOUNTER — Telehealth: Payer: Self-pay | Admitting: Obstetrics and Gynecology

## 2018-08-23 NOTE — Telephone Encounter (Signed)
Pt states that she has had bad hemorrhoid pain and is not able to go into work and requesting a note for work.

## 2018-08-23 NOTE — Telephone Encounter (Signed)
Spoke with pt. Pt has hemorrhoids. Just started with them. Pt is requesting a work note. Was late for work today and is requesting to be out 5/5-5/6. Spoke with Tish, Charity fundraiser . Advised that is ok. Will send through MyChart. Pt aware. JSY

## 2018-09-01 ENCOUNTER — Ambulatory Visit (INDEPENDENT_AMBULATORY_CARE_PROVIDER_SITE_OTHER): Payer: BLUE CROSS/BLUE SHIELD | Admitting: Women's Health

## 2018-09-01 ENCOUNTER — Encounter: Payer: Self-pay | Admitting: Women's Health

## 2018-09-01 ENCOUNTER — Other Ambulatory Visit: Payer: Self-pay

## 2018-09-01 VITALS — BP 112/71 | Wt 147.0 lb

## 2018-09-01 DIAGNOSIS — O99343 Other mental disorders complicating pregnancy, third trimester: Secondary | ICD-10-CM

## 2018-09-01 DIAGNOSIS — Z3A32 32 weeks gestation of pregnancy: Secondary | ICD-10-CM

## 2018-09-01 DIAGNOSIS — F418 Other specified anxiety disorders: Secondary | ICD-10-CM

## 2018-09-01 DIAGNOSIS — Z3403 Encounter for supervision of normal first pregnancy, third trimester: Secondary | ICD-10-CM

## 2018-09-01 MED ORDER — HYDROXYZINE HCL 25 MG PO TABS: 25.0000 mg | ORAL_TABLET | Freq: Three times a day (TID) | ORAL | 0 refills | Status: DC | PRN

## 2018-09-01 MED ORDER — SERTRALINE HCL 25 MG PO TABS: 25.0000 mg | ORAL_TABLET | Freq: Every day | ORAL | 3 refills | Status: DC

## 2018-09-01 NOTE — Patient Instructions (Signed)
Juanda Crumble, I greatly value your feedback.  If you receive a survey following your visit with [place] today, we appreciate you taking the time to fill it out.  Thanks, Joellyn Haff, CNM, [facility]  [facility] HAS MOVED!!! It is now [facility] at [facility] ([address] [place], [place] [address]) Entrance located off of [address] Free 24/7 valet parking    Call the office [phone]) or go to [facility] if:  You begin to have strong, frequent contractions  Your water breaks.  Sometimes it is a big gush of fluid, sometimes it is just a trickle that keeps getting your panties wet or running down your legs  You have vaginal bleeding.  It is normal to have a small amount of spotting if your cervix was checked.   You don't feel your baby moving like normal.  If you don't, get you something to eat and drink and lay down and focus on feeling your baby move.  You should feel at least 10 movements in 2 hours.  If you don't, you should call the office or go to [facility].   Home Blood Pressure Monitoring for Patients   Your provider has recommended that you check your blood pressure (BP) at least once a week at home. If you do not have a blood pressure cuff at home, one will be provided for you. Contact your provider if you have not received your monitor within 1 week.   Helpful Tips for Accurate Home Blood Pressure Checks  . Don't smoke, exercise, or drink caffeine 30 minutes before checking your BP . Use the restroom before checking your BP (a full bladder can raise your pressure) . Relax in a comfortable upright chair . Feet on the ground . Left arm resting comfortably on a flat surface at the level of your heart . Legs uncrossed . Back supported . Sit quietly and don't talk . Place the cuff on your bare arm . Adjust snuggly, so that only two fingertips can fit between your skin and the top of the cuff . Check 2 readings separated by at least one minute  . Keep a log of your BP readings . For a visual, please reference this diagram: http://ccnc.care/bpdiagram  Provider Name: [facility]     Phone: [phone]  Zone 1: ALL CLEAR  Continue to monitor your symptoms:  . BP reading is less than 140 (top number) or less than 90 (bottom number)  . No right upper stomach pain . No headaches or seeing spots . No feeling nauseated or throwing up . No swelling in face and hands  Zone 2: CAUTION Call your doctor's office for any of the following:  . BP reading is greater than 140 (top number) or greater than 90 (bottom number)  . Stomach pain under your ribs in the middle or right side . Headaches or seeing spots . Feeling nauseated or throwing up . Swelling in face and hands  Zone 3: EMERGENCY  Seek immediate medical care if you have any of the following:  . BP reading is greater than160 (top number) or greater than 110 (bottom number) . Severe headaches not improving with Tylenol . Serious difficulty catching your breath . Any worsening symptoms from Zone 2   Generalized Anxiety Disorder, Adult Generalized anxiety disorder (GAD) is a mental health disorder. People with this condition constantly worry about everyday events. Unlike normal anxiety, worry related to GAD is not triggered by a  specific event. These worries also do not fade or get better with time. GAD interferes with life functions, including relationships, work, and school. GAD can vary from mild to severe. People with severe GAD can have intense waves of anxiety with physical symptoms (panic attacks). What are the causes? The exact cause of GAD is not known. What increases the risk? This condition is more likely to develop in:  Women.  People who have a family history of anxiety disorders.  People who are very shy.  People who experience very stressful life events, such as the death of a loved one.  People who have a very stressful family environment. What are  the signs or symptoms? People with GAD often worry excessively about many things in their lives, such as their health and family. They may also be overly concerned about:  Doing well at work.  Being on time.  Natural disasters.  Friendships. Physical symptoms of GAD include:  Fatigue.  Muscle tension or having muscle twitches.  Trembling or feeling shaky.  Being easily startled.  Feeling like your heart is pounding or racing.  Feeling out of breath or like you cannot take a deep breath.  Having trouble falling asleep or staying asleep.  Sweating.  Nausea, diarrhea, or irritable bowel syndrome (IBS).  Headaches.  Trouble concentrating or remembering facts.  Restlessness.  Irritability. How is this diagnosed? Your health care provider can diagnose GAD based on your symptoms and medical history. You will also have a physical exam. The health care provider will ask specific questions about your symptoms, including how severe they are, when they started, and if they come and go. Your health care provider may ask you about your use of alcohol or drugs, including prescription medicines. Your health care provider may refer you to a mental health specialist for further evaluation. Your health care provider will do a thorough examination and may perform additional tests to rule out other possible causes of your symptoms. To be diagnosed with GAD, a person must have anxiety that:  Is out of his or her control.  Affects several different aspects of his or her life, such as work and relationships.  Causes distress that makes him or her unable to take part in normal activities.  Includes at least three physical symptoms of GAD, such as restlessness, fatigue, trouble concentrating, irritability, muscle tension, or sleep problems. Before your health care provider can confirm a diagnosis of GAD, these symptoms must be present more days than they are not, and they must last for six  months or longer. How is this treated? The following therapies are usually used to treat GAD:  Medicine. Antidepressant medicine is usually prescribed for long-term daily control. Antianxiety medicines may be added in severe cases, especially when panic attacks occur.  Talk therapy (psychotherapy). Certain types of talk therapy can be helpful in treating GAD by providing support, education, and guidance. Options include: ? Cognitive behavioral therapy (CBT). People learn coping skills and techniques to ease their anxiety. They learn to identify unrealistic or negative thoughts and behaviors and to replace them with positive ones. ? Acceptance and commitment therapy (ACT). This treatment teaches people how to be mindful as a way to cope with unwanted thoughts and feelings. ? Biofeedback. This process trains you to manage your body's response (physiological response) through breathing techniques and relaxation methods. You will work with a therapist while machines are used to monitor your physical symptoms.  Stress management techniques. These include yoga, meditation, and exercise.  A mental health specialist can help determine which treatment is best for you. Some people see improvement with one type of therapy. However, other people require a combination of therapies. Follow these instructions at home:  Take over-the-counter and prescription medicines only as told by your health care provider.  Try to maintain a normal routine.  Try to anticipate stressful situations and allow extra time to manage them.  Practice any stress management or self-calming techniques as taught by your health care provider.  Do not punish yourself for setbacks or for not making progress.  Try to recognize your accomplishments, even if they are small.  Keep all follow-up visits as told by your health care provider. This is important. Contact a health care provider if:  Your symptoms do not get better.  Your  symptoms get worse.  You have signs of depression, such as: ? A persistently sad, cranky, or irritable mood. ? Loss of enjoyment in activities that used to bring you joy. ? Change in weight or eating. ? Changes in sleeping habits. ? Avoiding friends or family members. ? Loss of energy for normal tasks. ? Feelings of guilt or worthlessness. Get help right away if:  You have serious thoughts about hurting yourself or others. If you ever feel like you may hurt yourself or others, or have thoughts about taking your own life, get help right away. You can go to your nearest emergency department or call:  Your local emergency services (911 in the [place]  A suicide crisis helpline, such as the National Suicide Prevention Lifeline at (430)253-15811-(501)272-3596. This is open 24 hours a day. Summary  Generalized anxiety disorder (GAD) is a mental health disorder that involves worry that is not triggered by a specific event.  People with GAD often worry excessively about many things in their lives, such as their health and family.  GAD may cause physical symptoms such as restlessness, trouble concentrating, sleep problems, frequent sweating, nausea, diarrhea, headaches, and trembling or muscle twitching.  A mental health specialist can help determine which treatment is best for you. Some people see improvement with one type of therapy. However, other people require a combination of therapies. This information is not intended to replace advice given to you by your health care provider. Make sure you discuss any questions you have with your health care provider. Document Released: 08/02/2012 Document Revised: 02/26/2016 Document Reviewed: 02/26/2016 Elsevier Interactive Patient Education  2019 [employer]

## 2018-09-01 NOTE — Progress Notes (Signed)
TELEHEALTH VIRTUAL OBSTETRICS VISIT ENCOUNTER NOTE Patient name: Pamela Gallagher MRN [id number]  Date of birth: [birth year]  I connected with patient on 09/01/18 at  2:00 PM EDT by [facility] [place] [facility] and verified that I am speaking with the correct person using two identifiers. Due to COVID-19 recommendations, pt is not currently in our office.    I discussed the limitations, risks, security and privacy concerns of performing an evaluation and management service by telephone and the availability of in person appointments. I also discussed with the patient that there may be a patient responsible charge related to this service. The patient expressed understanding and agreed to proceed.  Chief Complaint:   Routine Prenatal Visit  History of Present Illness:   Pamela Gallagher is a 32 y.o. G1P0 female at [redacted]w[redacted]d with an Estimated Date of Delivery: 10/26/18 being evaluated today for ongoing management of a low-risk pregnancy.  Today she reports increased anxiety/panic attacks, does have h/o dep/anx/PTSD, has been on multiple meds in past, none this pregnancy. Denies SI. Declines counseling, wants to get started on meds. Decreased appetite. Trouble sleeping despite tylenol pm.  Contractions: Not present.  .  Movement: Present. denies leaking of fluid. Review of Systems:   Pertinent items are noted in HPI Denies abnormal vaginal discharge w/ itching/odor/irritation, headaches, visual changes, shortness of breath, chest pain, abdominal pain, severe nausea/vomiting, or problems with urination or bowel movements unless otherwise stated above. Pertinent History Reviewed:  Reviewed past medical,surgical, social, obstetrical and family history.  Reviewed problem list, medications and allergies. Physical Assessment:   Vitals:   09/01/18 1429  BP: 112/71  Weight: 147 lb (66.7 kg)  Body mass index is 26.04 kg/m.        Physical Examination:   General:  Alert, oriented and cooperative.   Mental Status: Normal mood and  affect perceived. Normal judgment and thought content.  Rest of physical exam deferred due to type of encounter  No results found for this or any previous visit (from the past 24 hour(s)).  Assessment & Plan:  1) Pregnancy G1P0 at [redacted]w[redacted]d with an Estimated Date of Delivery: 10/26/18   2) Dep/anx/PTSD, anxiety increasing, rx zoloft and vistaril   Meds:  Meds ordered this encounter  Medications  . sertraline (ZOLOFT) 25 MG tablet    Sig: Take 1 tablet (25 mg total) by mouth daily.    Dispense:  30 tablet    Refill:  3    Order Specific Question:   Supervising Provider    Answer:   Despina Hidden, LUTHER H [[id number]]  . hydrOXYzine (ATARAX/VISTARIL) 25 MG tablet    Sig: Take 1 tablet (25 mg total) by mouth 3 (three) times daily as needed for anxiety.    Dispense:  30 tablet    Refill:  0    Order Specific Question:   Supervising Provider    Answer:   Lazaro Arms [[id number]]    Labs/procedures today: none  Plan:  Continue routine obstetrical care. Has BP cuff. Check bp weekly, let us know if >140/90.   Reviewed: Preterm labor symptoms and general obstetric precautions including but not limited to vaginal bleeding, contractions, leaking of fluid and fetal movement were reviewed in detail with the patient. The patient was advised to call back or seek an in-person office evaluation/go to [facility] at [facility] for any urgent or concerning symptoms. All questions were answered. Please refer to After Visit Summary for other counseling recommendations.   I provided 15 minutes of non-face-to-face  time during this encounter.  Follow-up: Return in about 2 weeks (around 09/15/2018) for LROB webex.  No orders of the defined types were placed in this encounter.  Cheral MarkerKimberly R Lekesha Claw CNM, [facility] 09/01/2018 2:52 PM

## 2018-09-14 ENCOUNTER — Telehealth: Payer: Self-pay | Admitting: Women's Health

## 2018-09-14 ENCOUNTER — Telehealth: Payer: Self-pay | Admitting: *Deleted

## 2018-09-14 NOTE — Telephone Encounter (Signed)
Patient called stating that she came in to the office last week with pain, pt states that they recently opened back up and she thinks that she needs to stay on but is still having pressure down there. Pt states that provider stated that he might take her out of work. Please contact pt

## 2018-09-14 NOTE — Telephone Encounter (Signed)
Patient states she is starting to work more and is finding that the pain is getting worse despite wearing a maternity support band.  She is wanting to try to continue working if possible as long as she can but doesn't think she will be able too.  Asking what she needs to do if she decides to come out early as Dr Despina Hidden discussed this with her at a prior visit.  Advised to send him a mychart message when she was ready and he would discuss with her.  Verbalized understanding.

## 2018-09-15 ENCOUNTER — Encounter: Payer: BLUE CROSS/BLUE SHIELD | Admitting: Women's Health

## 2018-09-16 ENCOUNTER — Encounter: Payer: Self-pay | Admitting: Women's Health

## 2018-09-16 ENCOUNTER — Other Ambulatory Visit: Payer: Self-pay

## 2018-09-16 ENCOUNTER — Ambulatory Visit (INDEPENDENT_AMBULATORY_CARE_PROVIDER_SITE_OTHER): Payer: BLUE CROSS/BLUE SHIELD | Admitting: Women's Health

## 2018-09-16 VITALS — BP 114/72 | HR 92 | Wt 150.8 lb

## 2018-09-16 DIAGNOSIS — Z3A34 34 weeks gestation of pregnancy: Secondary | ICD-10-CM

## 2018-09-16 DIAGNOSIS — Z3403 Encounter for supervision of normal first pregnancy, third trimester: Secondary | ICD-10-CM

## 2018-09-16 NOTE — Patient Instructions (Signed)
Pamela CrumbleAshley N Ozturk, I greatly value your feedback.  If you receive a survey following your visit with us today, we appreciate you taking the time to fill it out.  Thanks, Joellyn HaffKim Mikaylee Arseneau, CNM, [facility]  [facility] is now [facility] ([address] , [place] [address]) Entrance located off of [address] Free 24/7 valet parking   Home Blood Pressure Monitoring for Patients   Your provider has recommended that you check your blood pressure (BP) at least once a week at home. If you do not have a blood pressure cuff at home, one will be provided for you. Contact your provider if you have not received your monitor within 1 week.   Helpful Tips for Accurate Home Blood Pressure Checks  . Don't smoke, exercise, or drink caffeine 30 minutes before checking your BP . Use the restroom before checking your BP (a full bladder can raise your pressure) . Relax in a comfortable upright chair . Feet on the ground . Left arm resting comfortably on a flat surface at the level of your heart . Legs uncrossed . Back supported . Sit quietly and don't talk . Place the cuff on your bare arm . Adjust snuggly, so that only two fingertips can fit between your skin and the top of the cuff . Check 2 readings separated by at least one minute . Keep a log of your BP readings . For a visual, please reference this diagram: http://ccnc.care/bpdiagram  Provider Name: [facility]     Phone: [phone]  Zone 1: ALL CLEAR  Continue to monitor your symptoms:  . BP reading is less than 140 (top number) or less than 90 (bottom number)  . No right upper stomach pain . No headaches or seeing spots . No feeling nauseated or throwing up . No swelling in face and hands  Zone 2: CAUTION Call your doctor's office for any of the following:  . BP reading is greater than 140 (top number) or greater than 90 (bottom number)  . Stomach pain under your ribs in the middle  or right side . Headaches or seeing spots . Feeling nauseated or throwing up . Swelling in face and hands  Zone 3: EMERGENCY  Seek immediate medical care if you have any of the following:  . BP reading is greater than160 (top number) or greater than 110 (bottom number) . Severe headaches not improving with Tylenol . Serious difficulty catching your breath . Any worsening symptoms from Zone 2     Call the office [phone]) or go [facility] if:  You begin to have strong, frequent contractions  Your water breaks.  Sometimes it is a big gush of fluid, sometimes it is just a trickle that keeps getting your panties wet or running down your legs  You have vaginal bleeding.  It is normal to have a small amount of spotting if your cervix was checked.   You don't feel your baby moving like normal.  If you don't, get you something to eat and drink and lay down and focus on feeling your baby move.  You should feel at least 10 movements in 2 hours.  If you don't, you should call the office or go to [facility].     Preterm Labor and Birth Information  The normal length of a pregnancy is 39-41 weeks. Preterm labor is when labor starts before 37 completed weeks of pregnancy. What are the risk  factors for preterm labor? Preterm labor is more likely to occur in women who:  Have certain infections during pregnancy such as a bladder infection, sexually transmitted infection, or infection inside the uterus (chorioamnionitis).  Have a shorter-than-normal cervix.  Have gone into preterm labor before.  Have had surgery on their cervix.  Are younger than age 43 or older than age 58.  Are African American.  Are pregnant with twins or multiple babies (multiple gestation).  Take street drugs or smoke while pregnant.  Do not gain enough weight while pregnant.  Became pregnant shortly after having been pregnant. What are the symptoms of preterm labor? Symptoms of preterm labor  include:  Cramps similar to those that can happen during a menstrual period. The cramps may happen with diarrhea.  Pain in the abdomen or lower back.  Regular uterine contractions that may feel like tightening of the abdomen.  A feeling of increased pressure in the pelvis.  Increased watery or bloody mucus discharge from the vagina.  Water breaking (ruptured amniotic sac). Why is it important to recognize signs of preterm labor? It is important to recognize signs of preterm labor because babies who are born prematurely may not be fully developed. This can put them at an increased risk for:  Long-term (chronic) heart and lung problems.  Difficulty immediately after birth with regulating body systems, including blood sugar, body temperature, heart rate, and breathing rate.  Bleeding in the brain.  Cerebral palsy.  Learning difficulties.  Death. These risks are highest for babies who are born before 34 weeks of pregnancy. How is preterm labor treated? Treatment depends on the length of your pregnancy, your condition, and the health of your baby. It may involve:  Having a stitch (suture) placed in your cervix to prevent your cervix from opening too early (cerclage).  Taking or being given medicines, such as: ? Hormone medicines. These may be given early in pregnancy to help support the pregnancy. ? Medicine to stop contractions. ? Medicines to help mature the baby's lungs. These may be prescribed if the risk of delivery is high. ? Medicines to prevent your baby from developing cerebral palsy. If the labor happens before 34 weeks of pregnancy, you may need to stay in the hospital. What should I do if I think I am in preterm labor? If you think that you are going into preterm labor, call your health care provider right away. How can I prevent preterm labor in future pregnancies? To increase your chance of having a full-term pregnancy:  Do not use any tobacco products, such as  cigarettes, chewing tobacco, and e-cigarettes. If you need help quitting, ask your health care provider.  Do not use street drugs or medicines that have not been prescribed to you during your pregnancy.  Talk with your health care provider before taking any herbal supplements, even if you have been taking them regularly.  Make sure you gain a healthy amount of weight during your pregnancy.  Watch for infection. If you think that you might have an infection, get it checked right away.  Make sure to tell your health care provider if you have gone into preterm labor before. This information is not intended to replace advice given to you by your health care provider. Make sure you discuss any questions you have with your health care provider. Document Released: 06/28/2003 Document Revised: 09/18/2015 Document Reviewed: 08/29/2015 Elsevier Interactive Patient Education  2019 [employer].

## 2018-09-16 NOTE — Progress Notes (Signed)
   LOW-RISK PREGNANCY VISIT Patient name: Pamela Gallagher MRN [id number]  Date of birth: May 16, 1985 Chief Complaint:   Routine Prenatal Visit (not sleeping/ tylenol pm and meltonin didn't work)  History of Present Illness:   Pamela Gallagher is a 33 y.o. G1P0 female at [redacted]w[redacted]d with an Estimated Date of Delivery: 10/26/18 being seen today for ongoing management of a low-risk pregnancy.  Today she reports lots of hip pain, so bad isn't able to sleep at night despite sleeping medicine, feels like she is going crazy b/c she is not sleeping. Isn't able to work d/t pain/fatigue. Contractions: Irregular.  .  Movement: Present. denies leaking of fluid. Review of Systems:   Pertinent items are noted in HPI Denies abnormal vaginal discharge w/ itching/odor/irritation, headaches, visual changes, shortness of breath, chest pain, abdominal pain, severe nausea/vomiting, or problems with urination or bowel movements unless otherwise stated above. Pertinent History Reviewed:  Reviewed past medical,surgical, social, obstetrical and family history.  Reviewed problem list, medications and allergies. Physical Assessment:   Vitals:   09/16/18 1448  BP: 114/72  Pulse: 92  Weight: 150 lb 12.8 oz (68.4 kg)  Body mass index is 26.71 kg/m.        Physical Examination:   General appearance: Well appearing, and in no distress  Mental status: Alert, oriented to person, place, and time  Skin: Warm & dry  Cardiovascular: Normal heart rate noted  Respiratory: Normal respiratory effort, no distress  Abdomen: Soft, gravid, nontender  Pelvic: Cervical exam deferred         Extremities: Edema: Trace  Fetal Status:     Movement: Present    No results found for this or any previous visit (from the past 24 hour(s)).  Assessment & Plan:  1) Low-risk pregnancy G1P0 at [redacted]w[redacted]d with an Estimated Date of Delivery: 10/26/18   2) Severe hip pain, worse at night, causing sleep deprivation/fatigue. OK to begin maternity leave  effective 09/17/18   Meds: No orders of the defined types were placed in this encounter.  Labs/procedures today: none  Plan:  Continue routine obstetrical care   Reviewed: Preterm labor symptoms and general obstetric precautions including but not limited to vaginal bleeding, contractions, leaking of fluid and fetal movement were reviewed in detail with the patient.  All questions were answered. Has home bp cuff. Check bp weekly, let us know if >140/90.   Follow-up: Return in about 2 weeks (around 09/30/2018) for LROB webex.  No orders of the defined types were placed in this encounter.  Cheral Marker CNM, [facility] 09/16/2018 3:18 PM

## 2018-09-29 ENCOUNTER — Encounter: Payer: Self-pay | Admitting: *Deleted

## 2018-09-30 ENCOUNTER — Ambulatory Visit (INDEPENDENT_AMBULATORY_CARE_PROVIDER_SITE_OTHER): Payer: BC Managed Care – PPO | Admitting: Obstetrics & Gynecology

## 2018-09-30 ENCOUNTER — Other Ambulatory Visit: Payer: Self-pay

## 2018-09-30 VITALS — Wt 154.0 lb

## 2018-09-30 DIAGNOSIS — Z3403 Encounter for supervision of normal first pregnancy, third trimester: Secondary | ICD-10-CM

## 2018-09-30 DIAGNOSIS — Z3A36 36 weeks gestation of pregnancy: Secondary | ICD-10-CM

## 2018-09-30 DIAGNOSIS — Z1389 Encounter for screening for other disorder: Secondary | ICD-10-CM

## 2018-09-30 LAB — POCT URINALYSIS DIPSTICK OB
Blood, UA: NEGATIVE
Glucose, UA: NEGATIVE
Ketones, UA: NEGATIVE
Leukocytes, UA: NEGATIVE
Nitrite, UA: NEGATIVE
POC,PROTEIN,UA: NEGATIVE

## 2018-09-30 LAB — OB RESULTS CONSOLE GC/CHLAMYDIA: Gonorrhea: NEGATIVE

## 2018-09-30 LAB — OB RESULTS CONSOLE GBS: GBS: NEGATIVE

## 2018-09-30 NOTE — Progress Notes (Signed)
   LOW-RISK PREGNANCY VISIT Patient name: Pamela Gallagher MRN [id number]  Date of birth: [birth year] Chief Complaint:   Routine Prenatal Visit  History of Present Illness:   Pamela Gallagher is a 33 y.o. G1P0 female at [redacted]w[redacted]d with an Estimated Date of Delivery: 10/26/18 being seen today for ongoing management of a low-risk pregnancy.  Today she reports no complaints. Contractions: Irritability. Vag. Bleeding: None.  Movement: Present. denies leaking of fluid. Review of Systems:   Pertinent items are noted in HPI Denies abnormal vaginal discharge w/ itching/odor/irritation, headaches, visual changes, shortness of breath, chest pain, abdominal pain, severe nausea/vomiting, or problems with urination or bowel movements unless otherwise stated above. Pertinent History Reviewed:  Reviewed past medical,surgical, social, obstetrical and family history.  Reviewed problem list, medications and allergies. Physical Assessment:   Vitals:   09/30/18 1348  Weight: 154 lb (69.9 kg)  Body mass index is 27.28 kg/m.        Physical Examination:   General appearance: Well appearing, and in no distress  Mental status: Alert, oriented to person, place, and time  Skin: Warm & dry  Cardiovascular: Normal heart rate noted  Respiratory: Normal respiratory effort, no distress  Abdomen: Soft, gravid, nontender  Pelvic: Cervical exam performed  Dilation: Closed Effacement (%): 20 Station: -3LTC  Extremities: Edema: Trace  Fetal Status: Fetal Heart Rate (bpm): 156 Fundal Height: 36 cm Movement: Present Presentation: Vertex  Results for orders placed or performed in visit on 09/30/18 (from the past 24 hour(s))  POC Urinalysis Dipstick OB   Collection Time: 09/30/18  1:49 PM  Result Value Ref Range   Color, UA     Clarity, UA     Glucose, UA Negative Negative   Bilirubin, UA     Ketones, UA neg    Spec Grav, UA     Blood, UA neg    pH, UA     POC,PROTEIN,UA Negative Negative, Trace, Small (1+), Moderate  (2+), Large (3+), 4+   Urobilinogen, UA     Nitrite, UA neg    Leukocytes, UA Negative Negative   Appearance     Odor      Assessment & Plan:  1) Low-risk pregnancy G1P0 at [redacted]w[redacted]d with an Estimated Date of Delivery: 10/26/18   2)   Meds: No orders of the defined types were placed in this encounter.  Labs/procedures today: cultures done  Plan:  Continue routine obstetrical care   Reviewed: Term labor symptoms and general obstetric precautions including but not limited to vaginal bleeding, contractions, leaking of fluid and fetal movement were reviewed in detail with the patient.  All questions were answered  Follow-up: Return in about 1 week (around 10/07/2018) for webex , LROB.  Orders Placed This Encounter  Procedures  . Strep Gp B NAA+Rflx  . GC/Chlamydia Probe Amp  . POC Urinalysis Dipstick OB   Florian Buff  09/30/2018 2:34 PM

## 2018-10-02 LAB — STREP GP B NAA+RFLX: Strep Gp B NAA+Rflx: NEGATIVE

## 2018-10-04 ENCOUNTER — Telehealth: Payer: Self-pay | Admitting: *Deleted

## 2018-10-04 NOTE — Telephone Encounter (Signed)
Pt would like a call back in ref to some forms that was was sent from [place]

## 2018-10-05 ENCOUNTER — Telehealth: Payer: Self-pay | Admitting: *Deleted

## 2018-10-05 ENCOUNTER — Encounter: Payer: Self-pay | Admitting: *Deleted

## 2018-10-05 NOTE — Telephone Encounter (Signed)
Spoke to patient. Will be out of work 13 weeks.

## 2018-10-06 ENCOUNTER — Ambulatory Visit (INDEPENDENT_AMBULATORY_CARE_PROVIDER_SITE_OTHER): Payer: BC Managed Care – PPO | Admitting: Obstetrics and Gynecology

## 2018-10-06 DIAGNOSIS — Z3A37 37 weeks gestation of pregnancy: Secondary | ICD-10-CM

## 2018-10-06 DIAGNOSIS — Z3403 Encounter for supervision of normal first pregnancy, third trimester: Secondary | ICD-10-CM

## 2018-10-06 NOTE — Progress Notes (Signed)
Patient ID: Pamela Gallagher, female   DOB: [birth year], 33 y.o.   MRN: [id number]    [place] VIRTUAL VIDEO VISIT ENCOUNTER NOTE  Provider location: Center for [place] [facility]   I connected with [name] on 10/06/2018 at  3:45 PM EDT by WebEx Video Encounter at home and verified that I am speaking with the correct person using two identifiers.   I discussed the limitations, risks, security and privacy concerns of performing an evaluation and management service by telephone and the availability of in person appointments. I also discussed with the patient that there may be a patient responsible charge related to this service. The patient expressed understanding and agreed to proceed. Subjective:  Pamela Gallagher is a 33 y.o. G1P0 at [redacted]w[redacted]d being seen today for ongoing prenatal care.  She is currently monitored for the following issues for this low-risk pregnancy and has Dysuria; Costochondral chest pain; Anovulatory (dysfunctional uterine) bleeding; Benzodiazepine dependence ([place]); Supervision of normal first pregnancy; Depression/anxiety/PTSD; Smoker; Marijuana use; and Endometriosis on their problem list.  Patient reports no complaints.   .  .   . Denies any leaking of fluid.   The following portions of the patient's history were reviewed and updated as appropriate: allergies, current medications, past family history, past medical history, past social history, past surgical history and problem list.   Objective:  There were no vitals filed for this visit.  Fetal Status:           General:  Alert, oriented and cooperative. Patient is in no acute distress.  Respiratory: Normal respiratory effort, no problems with respiration noted  Mental Status: Normal mood and affect. Normal behavior. Normal judgment and thought content.  Rest of physical exam deferred due to type of encounter  Imaging: No results found.  Assessment and Plan:  Pregnancy: G1P0  at [redacted]w[redacted]d  Term labor symptoms and general obstetric precautions including but not limited to vaginal bleeding, contractions, leaking of fluid and fetal movement were reviewed in detail with the patient. I discussed the assessment and treatment plan with the patient. The patient was provided an opportunity to ask questions and all were answered. The patient agreed with the plan and demonstrated an understanding of the instructions. The patient was advised to call back or seek an in-person office evaluation/go to MAU at [facility] for any urgent or concerning symptoms. Please refer to After Visit Summary for other counseling recommendations.   I provided 7 minutes of face-to-face time during this encounter.  No follow-ups on file.  Future Appointments  Date Time Provider [place]  10/06/2018  3:45 PM Jonnie Kind, MD [facility]    By signing my name below, I, Samul Dada, attest that this documentation has been prepared under the direction and in the presence of Jonnie Kind, MD. Electronically Signed: [place]. 10/06/18. 3:29 PM.  I personally performed the services described in this documentation, which was SCRIBED in my presence. The recorded information has been reviewed and considered accurate. It has been edited as necessary during review. Jonnie Kind, MD

## 2018-10-07 LAB — GC/CHLAMYDIA PROBE AMP
Chlamydia trachomatis, NAA: NEGATIVE
Neisseria Gonorrhoeae by PCR: NEGATIVE

## 2018-10-13 ENCOUNTER — Other Ambulatory Visit: Payer: Self-pay

## 2018-10-13 ENCOUNTER — Ambulatory Visit (INDEPENDENT_AMBULATORY_CARE_PROVIDER_SITE_OTHER): Payer: BC Managed Care – PPO | Admitting: Women's Health

## 2018-10-13 ENCOUNTER — Encounter: Payer: Self-pay | Admitting: Women's Health

## 2018-10-13 VITALS — BP 122/82 | HR 93 | Wt 156.0 lb

## 2018-10-13 DIAGNOSIS — Z3403 Encounter for supervision of normal first pregnancy, third trimester: Secondary | ICD-10-CM

## 2018-10-13 DIAGNOSIS — Z23 Encounter for immunization: Secondary | ICD-10-CM

## 2018-10-13 DIAGNOSIS — Z1389 Encounter for screening for other disorder: Secondary | ICD-10-CM

## 2018-10-13 DIAGNOSIS — Z3A38 38 weeks gestation of pregnancy: Secondary | ICD-10-CM

## 2018-10-13 DIAGNOSIS — Z331 Pregnant state, incidental: Secondary | ICD-10-CM

## 2018-10-13 LAB — POCT URINALYSIS DIPSTICK OB
Blood, UA: NEGATIVE
Glucose, UA: NEGATIVE
Ketones, UA: NEGATIVE
Leukocytes, UA: NEGATIVE
Nitrite, UA: NEGATIVE
POC,PROTEIN,UA: NEGATIVE

## 2018-10-13 NOTE — Progress Notes (Signed)
   LOW-RISK PREGNANCY VISIT Patient name: Pamela Gallagher MRN [id number]  Date of birth: Mar 04, 1986 Chief Complaint:   Routine Prenatal Visit  History of Present Illness:   Pamela Gallagher is a 33 y.o. G1P0 female at [redacted]w[redacted]d with an Estimated Date of Delivery: 10/26/18 being seen today for ongoing management of a low-risk pregnancy.  Today she reports no complaints. Contractions: Irregular. Vag. Bleeding: None.  Movement: Present. denies leaking of fluid. Review of Systems:   Pertinent items are noted in HPI Denies abnormal vaginal discharge w/ itching/odor/irritation, headaches, visual changes, shortness of breath, chest pain, abdominal pain, severe nausea/vomiting, or problems with urination or bowel movements unless otherwise stated above. Pertinent History Reviewed:  Reviewed past medical,surgical, social, obstetrical and family history.  Reviewed problem list, medications and allergies. Physical Assessment:   Vitals:   10/13/18 1426  BP: 122/82  Pulse: 93  Weight: 156 lb (70.8 kg)  Body mass index is 27.63 kg/m.        Physical Examination:   General appearance: Well appearing, and in no distress  Mental status: Alert, oriented to person, place, and time  Skin: Warm & dry  Cardiovascular: Normal heart rate noted  Respiratory: Normal respiratory effort, no distress  Abdomen: Soft, gravid, nontender  Pelvic: Cervical exam performed  Dilation: Closed Effacement (%): Thick Station: -3  Extremities: Edema: Trace  Fetal Status: Fetal Heart Rate (bpm): 135 Fundal Height: 36 cm Movement: Present Presentation: Vertex  Results for orders placed or performed in visit on 10/13/18 (from the past 24 hour(s))  POC Urinalysis Dipstick OB   Collection Time: 10/13/18  2:39 PM  Result Value Ref Range   Color, UA     Clarity, UA     Glucose, UA Negative Negative   Bilirubin, UA     Ketones, UA neg    Spec Grav, UA     Blood, UA neg    pH, UA     POC,PROTEIN,UA Negative Negative, Trace,  Small (1+), Moderate (2+), Large (3+), 4+   Urobilinogen, UA     Nitrite, UA neg    Leukocytes, UA Negative Negative   Appearance     Odor      Assessment & Plan:  1) Low-risk pregnancy G1P0 at [redacted]w[redacted]d with an Estimated Date of Delivery: 10/26/18    Meds: No orders of the defined types were placed in this encounter.  Labs/procedures today: sve, tdap  Plan:  Continue routine obstetrical care   Reviewed: Term labor symptoms and general obstetric precautions including but not limited to vaginal bleeding, contractions, leaking of fluid and fetal movement were reviewed in detail with the patient.  All questions were answered. Has home bp cuff. Check bp weekly, let us know if >140/90.   Follow-up: Return in about 1 week (around 10/20/2018) for [place], Webex.  Orders Placed This Encounter  Procedures  . Tdap vaccine greater than or equal to 7yo IM  . POC Urinalysis Dipstick OB   Roma Schanz CNM, [facility] 10/13/2018 3:14 PM

## 2018-10-13 NOTE — Patient Instructions (Signed)
[name], I greatly value your feedback.  If you receive a survey following your visit with [place] today, we appreciate you taking the time to fill it out.  Thanks, Knute Neu, CNM, [facility]  [place]!!! It is now [place] at [facility] ([place], [place] [address]) Entrance located off of [address] parking    Call the office [phone]) or go to [facility] if:  You begin to have strong, frequent contractions  Your water breaks.  Sometimes it is a big gush of fluid, sometimes it is just a trickle that keeps getting your panties wet or running down your legs  You have vaginal bleeding.  It is normal to have a small amount of spotting if your cervix was checked.   You don't feel your baby moving like normal.  If you don't, get you something to eat and drink and lay down and focus on feeling your baby move.  You should feel at least 10 movements in 2 hours.  If you don't, you should call the office or go to [place] Blood Pressure Monitoring for Patients   Your provider has recommended that you check your blood pressure (BP) at least once a week at home. If you do not have a blood pressure cuff at home, one will be provided for you. Contact your provider if you have not received your monitor within 1 week.   Helpful Tips for Accurate Home Blood Pressure Checks  . Don't smoke, exercise, or drink caffeine 30 minutes before checking your BP . Use the restroom before checking your BP (a full bladder can raise your pressure) . Relax in a comfortable upright chair . Feet on the ground . Left arm resting comfortably on a flat surface at the level of your heart . Legs uncrossed . Back supported . Sit quietly and don't talk . Place the cuff on your bare arm . Adjust snuggly, so that only two fingertips can fit between your skin and the top of the cuff . Check 2 readings separated by at least one minute  . Keep a log of your BP readings . For a visual, please reference this diagram: http://ccnc.care/bpdiagram  Provider Name: [facility]     Phone: [phone]  Zone 1: ALL CLEAR  Continue to monitor your symptoms:  . BP reading is less than 140 (top number) or less than 90 (bottom number)  . No right upper stomach pain . No headaches or seeing spots . No feeling nauseated or throwing up . No swelling in face and hands  Zone 2: CAUTION Call your doctor's office for any of the following:  . BP reading is greater than 140 (top number) or greater than 90 (bottom number)  . Stomach pain under your ribs in the middle or right side . Headaches or seeing spots . Feeling nauseated or throwing up . Swelling in face and hands  Zone 3: EMERGENCY  Seek immediate medical care if you have any of the following:  . BP reading is greater than160 (top number) or greater than 110 (bottom number) . Severe headaches not improving with Tylenol . Serious difficulty catching your breath . Any worsening symptoms from Zone 2    Braxton Hicks Contractions Contractions of the uterus can occur throughout pregnancy, but they are not always a sign that you are in labor. You may have practice contractions called Braxton Hicks contractions. These  false labor contractions are sometimes confused with true labor. What are [place] contractions? Braxton Hicks contractions are tightening movements that occur in the muscles of the uterus before labor. Unlike true labor contractions, these contractions do not result in opening (dilation) and thinning of the cervix. Toward the end of pregnancy (32-34 weeks), Braxton Hicks contractions can happen more often and may become stronger. These contractions are sometimes difficult to tell apart from true labor because they can be very uncomfortable. You should not feel embarrassed if you go to the hospital with false labor. Sometimes, the only way to tell if you are in  true labor is for your health care provider to look for changes in the cervix. The health care provider will do a physical exam and may monitor your contractions. If you are not in true labor, the exam should show that your cervix is not dilating and your water has not broken. If there are no other health problems associated with your pregnancy, it is completely safe for you to be sent home with false labor. You may continue to have Braxton Hicks contractions until you go into true labor. How to tell the difference between true labor and false labor True labor  Contractions last 30-70 seconds.  Contractions become very regular.  Discomfort is usually felt in the top of the uterus, and it spreads to the lower abdomen and low back.  Contractions do not go away with walking.  Contractions usually become more intense and increase in frequency.  The cervix dilates and gets thinner. False labor  Contractions are usually shorter and not as strong as true labor contractions.  Contractions are usually irregular.  Contractions are often felt in the front of the lower abdomen and in the groin.  Contractions may go away when you walk around or change positions while lying down.  Contractions get weaker and are shorter-lasting as time goes on.  The cervix usually does not dilate or become thin. Follow these instructions at home:   Take over-the-counter and prescription medicines only as told by your health care provider.  Keep up with your usual exercises and follow other instructions from your health care provider.  Eat and drink lightly if you think you are going into labor.  If Braxton Hicks contractions are making you uncomfortable: ? Change your position from lying down or resting to walking, or change from walking to resting. ? Sit and rest in a tub of warm water. ? Drink enough fluid to keep your urine pale yellow. Dehydration may cause these contractions. ? Do slow and deep  breathing several times an hour.  Keep all follow-up prenatal visits as told by your health care provider. This is important. Contact a health care provider if:  You have a fever.  You have continuous pain in your abdomen. Get help right away if:  Your contractions become stronger, more regular, and closer together.  You have fluid leaking or gushing from your vagina.  You pass blood-tinged mucus (bloody show).  You have bleeding from your vagina.  You have low back pain that you never had before.  You feel your baby's head pushing down and causing pelvic pressure.  Your baby is not moving inside you as much as it used to. Summary  Contractions that occur before labor are called Braxton Hicks contractions, false labor, or practice contractions.  Braxton Hicks contractions are usually shorter, weaker, farther apart, and less regular than true labor contractions. True labor contractions usually become progressively  stronger and regular, and they become more frequent.  Manage discomfort from [facility] contractions by changing position, resting in a warm bath, drinking plenty of water, or practicing deep breathing. This information is not intended to replace advice given to you by your health care provider. Make sure you discuss any questions you have with your health care provider. Document Released: 08/21/2016 Document Revised: 01/20/2017 Document Reviewed: 08/21/2016 Elsevier Interactive Patient Education  2019 [employer].

## 2018-10-14 ENCOUNTER — Inpatient Hospital Stay (HOSPITAL_COMMUNITY)
Admission: AD | Admit: 2018-10-14 | Discharge: 2018-10-14 | Disposition: A | Discharge status: Home / Self Care | Payer: BC Managed Care – PPO | Attending: Obstetrics & Gynecology | Admitting: Obstetrics & Gynecology

## 2018-10-14 ENCOUNTER — Ambulatory Visit (INDEPENDENT_AMBULATORY_CARE_PROVIDER_SITE_OTHER): Payer: BC Managed Care – PPO | Admitting: Women's Health

## 2018-10-14 ENCOUNTER — Telehealth: Payer: Self-pay | Admitting: *Deleted

## 2018-10-14 ENCOUNTER — Encounter (HOSPITAL_COMMUNITY): Payer: Self-pay

## 2018-10-14 ENCOUNTER — Encounter: Payer: Self-pay | Admitting: Women's Health

## 2018-10-14 VITALS — BP 118/71 | HR 101 | Wt 156.0 lb

## 2018-10-14 DIAGNOSIS — Z3A37 37 weeks gestation of pregnancy: Secondary | ICD-10-CM | POA: Diagnosis not present

## 2018-10-14 DIAGNOSIS — Z3403 Encounter for supervision of normal first pregnancy, third trimester: Secondary | ICD-10-CM

## 2018-10-14 DIAGNOSIS — M549 Dorsalgia, unspecified: Secondary | ICD-10-CM | POA: Diagnosis not present

## 2018-10-14 DIAGNOSIS — Z3A38 38 weeks gestation of pregnancy: Secondary | ICD-10-CM | POA: Diagnosis not present

## 2018-10-14 DIAGNOSIS — O26893 Other specified pregnancy related conditions, third trimester: Secondary | ICD-10-CM | POA: Insufficient documentation

## 2018-10-14 DIAGNOSIS — O479 False labor, unspecified: Secondary | ICD-10-CM

## 2018-10-14 DIAGNOSIS — Z880 Allergy status to penicillin: Secondary | ICD-10-CM | POA: Diagnosis not present

## 2018-10-14 DIAGNOSIS — Z87891 Personal history of nicotine dependence: Secondary | ICD-10-CM | POA: Diagnosis not present

## 2018-10-14 DIAGNOSIS — R1013 Epigastric pain: Secondary | ICD-10-CM | POA: Diagnosis not present

## 2018-10-14 MED ORDER — ALUM & MAG HYDROXIDE-SIMETH 200-200-20 MG/5ML PO SUSP
30.0000 mL | Freq: Once | ORAL | Status: AC
  Administered 2018-10-14: 30 mL via ORAL
  Filled 2018-10-14: qty 30

## 2018-10-14 MED ORDER — LIDOCAINE VISCOUS HCL 2 % MT SOLN
15.0000 mL | Freq: Once | OROMUCOSAL | Status: AC
  Administered 2018-10-14: 15 mL via ORAL
  Filled 2018-10-14: qty 15

## 2018-10-14 MED ORDER — CYCLOBENZAPRINE HCL 10 MG PO TABS: 10.0000 mg | ORAL_TABLET | Freq: Three times a day (TID) | ORAL | 1 refills | Status: DC | PRN

## 2018-10-14 NOTE — MAU Provider Note (Signed)
Chief Complaint:  Abdominal Pain   First Provider Initiated Contact with Patient 10/14/18 0502      HPI: Pamela Gallagher is a 33 y.o. G1P0 at 6w2dwho presents to maternity admissions reporting sharp pain from umbilicus to sternum, less now (rates "2")  Also intermittent back pain. As I examine her the back pain coincides with contractions. . She reports good fetal movement, denies LOF, vaginal bleeding, vaginal itching/burning, urinary symptoms, h/a, dizziness, n/v, diarrhea, constipation or fever/chills.  She denies headache, visual changes or RUQ abdominal pain.  RN Note: Pt reports for the last 2 hours she has had pain from belly button upwards, constant and sharp/stabbing pain. Improved a little while she walked. Pt denies vaginal bleeding, LOF or vaginal discharge. Reports some decrease in movement since the pain started but has felt baby move. Reports a HA x2 days. BP was 128/88 at home tonight.   Past Medical History: Past Medical History:  Diagnosis Date  . Abnormal Pap smear 2011  . Anxiety and depression   . Asthma   . Endometriosis   . PONV (postoperative nausea and vomiting)   . PTSD (post-traumatic stress disorder)    "adultified child"  . UTI (lower urinary tract infection)   . Vaginal Pap smear, abnormal     Past obstetric history: OB History  Gravida Para Term Preterm AB Living  1            SAB TAB Ectopic Multiple Live Births               # Outcome Date GA Lbr Len/2nd Weight Sex Delivery Anes PTL Lv  1 Current             Past Surgical History: Past Surgical History:  Procedure Laterality Date  . BIOPSY  11/27/2014   Procedure: BIOPSY of endometriosis;  Surgeon: Everett Graff, MD;  Location: [facility];  Service: Gynecology;;  . Lynnell Chad  11/27/2014   Procedure: CHROMOPERTUBATION;  Surgeon: Everett Graff, MD;  Location: [facility];  Service: Gynecology;;  . LAPAROSCOPY N/A 11/27/2014   Procedure: LAPAROSCOPY OPERATIVE;  Surgeon: Everett Graff, MD;   Location: [facility];  Service: Gynecology;  Laterality: N/A;  . thyroglossoduct cyst     x 2  . WISDOM TOOTH EXTRACTION      Family History: Family History  Problem Relation Age of Onset  . Depression Mother   . Anxiety disorder Mother   . Depression Father   . Hearing loss Paternal Uncle   . Aneurysm Maternal Aunt     Social History: Social History   Tobacco Use  . Smoking status: Former Smoker    Packs/day: 0.25    Years: 0.50    Pack years: 0.12    Types: Cigarettes    Quit date: 05/31/2018    Years since quitting: 0.3  . Smokeless tobacco: Never Used  . Tobacco comment: 3 cigs per day  Substance Use Topics  . Alcohol use: Not Currently    Comment: social  . Drug use: No    Allergies:  Allergies  Allergen Reactions  . Clindamycin/Lincomycin Shortness Of Breath  . Penicillins Anaphylaxis    Has patient had a PCN reaction causing immediate rash, facial/tongue/throat swelling, SOB or lightheadedness with hypotension: Yes Has patient had a PCN reaction causing severe rash involving mucus membranes or skin necrosis: Yes Has patient had a PCN reaction that required hospitalization Yes Has patient had a PCN reaction occurring within the last 10 years: No If all of the above  answers are "NO", then may proceed with Cephalosporin use.   . Sulfa Antibiotics Anaphylaxis    Meds:  Medications Prior to Admission  Medication Sig Dispense Refill Last Dose  . albuterol (PROVENTIL HFA;VENTOLIN HFA) 108 (90 BASE) MCG/ACT inhaler Inhale 2 puffs into the lungs every 6 (six) hours as needed. Wheezing (Patient not taking: Reported on 09/16/2018)     . cholecalciferol (VITAMIN D3) 25 MCG (1000 UT) tablet Take 4,000 Units by mouth daily.     . cyclobenzaprine (FLEXERIL) 10 MG tablet Take 1 tablet (10 mg total) by mouth every 8 (eight) hours as needed for muscle spasms. (Patient not taking: Reported on 10/13/2018) 30 tablet 1   . hydrOXYzine (ATARAX/VISTARIL) 25 MG tablet Take 1 tablet (25  mg total) by mouth 3 (three) times daily as needed for anxiety. 30 tablet 0   . ipratropium-albuterol (DUONEB) 0.5-2.5 (3) MG/3ML SOLN Inhale 3 mLs into the lungs every 4 (four) hours as needed (FOR WHEEZING AND SHORTNESS OF BREATH).      Marland Kitchen. omeprazole (PRILOSEC) 20 MG capsule Take 1 capsule (20 mg total) by mouth daily. 30 capsule 3   . Prenatal Vit-Fe Fumarate-FA (PRENATAL VITAMIN PO) Take by mouth.     . sertraline (ZOLOFT) 25 MG tablet Take 1 tablet (25 mg total) by mouth daily. 30 tablet 3     I have reviewed patient's Past Medical Hx, Surgical Hx, Family Hx, Social Hx, medications and allergies.   ROS:  Review of Systems  Constitutional: Negative for appetite change, chills and fever.  Respiratory: Negative for shortness of breath.   Cardiovascular: Negative for chest pain.  Gastrointestinal: Positive for abdominal pain and constipation. Negative for diarrhea and nausea.  Genitourinary: Negative for dysuria and vaginal bleeding.  Musculoskeletal: Positive for back pain.   Other systems negative  Physical Exam   Patient Vitals for the past 24 hrs:  BP Temp Temp src Pulse Resp SpO2 Height Weight  10/14/18 0434 128/74 98.1 F (36.7 C) Oral (!) 101 18 98 % - -  10/14/18 0426 - - - - - - 5\' 3"  (1.6 m) 71.1 kg   Constitutional: Well-developed, well-nourished female in no acute distress.  Cardiovascular: normal rate and rhythm Respiratory: normal effort, clear to auscultation bilaterally GI: Abd soft, non-tender, gravid appropriate for gestational age.   No rebound or guarding. No hernia MS: Extremities nontender, no edema, normal ROM Neurologic: Alert and oriented x 4.  GU: Neg CVAT.  PELVIC EXAM:  Dilation: Fingertip Effacement (%): 60, 50 Cervical Position: Posterior Station: -2 Presentation: Vertex Exam by:: Makhi Muzquiz CNM  FHT:  Baseline 135 , moderate variability, accelerations present, no decelerations Contractions: q 3 mins Irregular     Labs: Results for orders  placed or performed in visit on 10/13/18 (from the past 24 hour(s))  POC Urinalysis Dipstick OB     Status: None   Collection Time: 10/13/18  2:39 PM  Result Value Ref Range   Color, UA     Clarity, UA     Glucose, UA Negative Negative   Bilirubin, UA     Ketones, UA neg    Spec Grav, UA     Blood, UA neg    pH, UA     POC,PROTEIN,UA Negative Negative, Trace, Small (1+), Moderate (2+), Large (3+), 4+   Urobilinogen, UA     Nitrite, UA neg    Leukocytes, UA Negative Negative   Appearance     Odor     O/Positive/-- (01/14 1526)  Imaging:  No results found.  MAU Course/MDM: I have ordered a GI cocktail  Discussed if the pain goes away, it was likely gastric in origin. NST reviewed, reactive Recheck of cervix: Dilation: Fingertip Effacement (%): 60, 70 Cervical Position: Posterior Station: -2 Presentation: Vertex Exam by:: Wynelle BourgeoisMarie Man Effertz, CNM  Epigastric pain unchanged but only a "2"  Reviewed signs of labor   I think she is in prodromal labor.    Assessment: Single intrauterine pregnancy at 4216w2d Uterine contractions, ? Prodromal labor Epigastric pain, suspect abdominal wall pain, ? Early diastasis  Plan: Discharge home Labor precautions and fetal kick counts Follow up in Office for prenatal visits and recheck  Encouraged to return here or to other Urgent Care/ED if she develops worsening of symptoms, increase in pain, fever, or other concerning symptoms.   Pt stable at time of discharge.  Wynelle BourgeoisMarie Rosio Weiss CNM, MSN Certified Nurse-Midwife 10/14/2018 5:17 AM

## 2018-10-14 NOTE — Progress Notes (Signed)
   Work-in LOW-RISK PREGNANCY VISIT Patient name: Pamela Gallagher MRN [id number]  Date of birth: 09-Sep-1985 Chief Complaint:   labor check  History of Present Illness:   Pamela Gallagher is a 33 y.o. G1P0 female at [redacted]w[redacted]d with an Estimated Date of Delivery: 10/26/18 being seen today for ongoing management of a low-risk pregnancy.  Today she reports uc's, back pain, went to MAU this am, was ft/50/-2. Contractions: Irregular.  .  Movement: Present. denies leaking of fluid. Review of Systems:   Pertinent items are noted in HPI Denies abnormal vaginal discharge w/ itching/odor/irritation, headaches, visual changes, shortness of breath, chest pain, abdominal pain, severe nausea/vomiting, or problems with urination or bowel movements unless otherwise stated above. Pertinent History Reviewed:  Reviewed past medical,surgical, social, obstetrical and family history.  Reviewed problem list, medications and allergies. Physical Assessment:   Vitals:   10/14/18 1424  BP: 118/71  Pulse: (!) 101  Weight: 156 lb (70.8 kg)  Body mass index is 27.63 kg/m.        Physical Examination:   General appearance: Well appearing, and in no distress  Mental status: Alert, oriented to person, place, and time  Skin: Warm & dry  Cardiovascular: Normal heart rate noted  Respiratory: Normal respiratory effort, no distress  Abdomen: Soft, gravid, nontender  Pelvic: Cervical exam performed  Dilation: Closed Effacement (%): 50 Station: -3  Extremities: Edema: Trace  Fetal Status: Fetal Heart Rate (bpm): 145 Fundal Height: 36 cm Movement: Present Presentation: Vertex  No results found for this or any previous visit (from the past 24 hour(s)).  Assessment & Plan:  1) Low-risk pregnancy G1P0 at [redacted]w[redacted]d with an Estimated Date of Delivery: 10/26/18   2) False/early labor, refilled flexeril, go home hydrate, rest, warm baths, reviewed when to return to hospital   Meds:  Meds ordered this encounter  Medications  .  cyclobenzaprine (FLEXERIL) 10 MG tablet    Sig: Take 1 tablet (10 mg total) by mouth every 8 (eight) hours as needed for muscle spasms.    Dispense:  30 tablet    Refill:  1    Order Specific Question:   Supervising Provider    Answer:   Florian Buff [[id number]]   Labs/procedures today: sve  Plan:  Continue routine obstetrical care   Reviewed: Term labor symptoms and general obstetric precautions including but not limited to vaginal bleeding, contractions, leaking of fluid and fetal movement were reviewed in detail with the patient.  All questions were answered.   Follow-up: Return for As scheduled.  No orders of the defined types were placed in this encounter.  [place], [facility] 10/14/2018 2:40 PM

## 2018-10-14 NOTE — Telephone Encounter (Signed)
Patient states she went to the hospital last night for contractions and back pain.  She was sent home but is still having some pain but not worse.  Encouraged to walk, try a warm bath and if contractions get worse, to call our office and we will get her in for labor check. Pt verbalized understanding.

## 2018-10-14 NOTE — MAU Note (Signed)
Pt reports for the last 2 hours she has had pain from belly button upwards, constant and sharp/stabbing pain. Improved a little while she walked. Pt denies vaginal bleeding, LOF or vaginal discharge. Reports some decrease in movement since the pain started but has felt baby move. Reports a HA x2 days. BP was 128/88 at home tonight.

## 2018-10-14 NOTE — Patient Instructions (Signed)
Pamela Gallagher, I greatly value your feedback.  If you receive a survey following your visit with us today, we appreciate you taking the time to fill it out.  Thanks, Pamela Gallagher, CNM, WHNP-BC  [facility] is now [facility] ([address] [place], [place] [address]) Entrance located off of [address] Free 24/7 valet parking    Call the office ([phone]) or go to [facility] if:  You begin to have strong, frequent contractions  Your water breaks.  Sometimes it is a big gush of fluid, sometimes it is just a trickle that keeps getting your panties wet or running down your legs  You have vaginal bleeding.  It is normal to have a small amount of spotting if your cervix was checked.   You don't feel your baby moving like normal.  If you don't, get you something to eat and drink and lay down and focus on feeling your baby move.  You should feel at least 10 movements in 2 hours.  If you don't, you should call the office or go to [facility].   Home Blood Pressure Monitoring for Patients   Your provider has recommended that you check your blood pressure (BP) at least once a week at home. If you do not have a blood pressure cuff at home, one will be provided for you. Contact your provider if you have not received your monitor within 1 week.   Helpful Tips for Accurate Home Blood Pressure Checks  . Don't smoke, exercise, or drink caffeine 30 minutes before checking your BP . Use the restroom before checking your BP (a full bladder can raise your pressure) . Relax in a comfortable upright chair . Feet on the ground . Left arm resting comfortably on a flat surface at the level of your heart . Legs uncrossed . Back supported . Sit quietly and don't talk . Place the cuff on your bare arm . Adjust snuggly, so that only two fingertips can fit between your skin and the top of the cuff . Check 2 readings separated by at least one minute  . Keep a log of your BP readings . For a visual, please reference this diagram: http://ccnc.care/bpdiagram  Provider Name: [facility]     Phone: [phone]  Zone 1: ALL CLEAR  Continue to monitor your symptoms:  . BP reading is less than 140 (top number) or less than 90 (bottom number)  . No right upper stomach pain . No headaches or seeing spots . No feeling nauseated or throwing up . No swelling in face and hands  Zone 2: CAUTION Call your doctor's office for any of the following:  . BP reading is greater than 140 (top number) or greater than 90 (bottom number)  . Stomach pain under your ribs in the middle or right side . Headaches or seeing spots . Feeling nauseated or throwing up . Swelling in face and hands  Zone 3: EMERGENCY  Seek immediate medical care if you have any of the following:  . BP reading is greater than160 (top number) or greater than 110 (bottom number) . Severe headaches not improving with Tylenol . Serious difficulty catching your breath . Any worsening symptoms from Zone 2    Braxton Hicks Contractions Contractions of the uterus can occur throughout pregnancy, but they are not always a sign that you are in labor. You may have practice contractions called Braxton Hicks contractions. These   false labor contractions are sometimes confused with true labor. What are Braxton Hicks contractions? Braxton Hicks contractions are tightening movements that occur in the muscles of the uterus before labor. Unlike true labor contractions, these contractions do not result in opening (dilation) and thinning of the cervix. Toward the end of pregnancy (32-34 weeks), Braxton Hicks contractions can happen more often and may become stronger. These contractions are sometimes difficult to tell apart from true labor because they can be very uncomfortable. You should not feel embarrassed if you go to the hospital with false labor. Sometimes, the only way to tell if you are in  true labor is for your health care provider to look for changes in the cervix. The health care provider will do a physical exam and may monitor your contractions. If you are not in true labor, the exam should show that your cervix is not dilating and your water has not broken. If there are no other health problems associated with your pregnancy, it is completely safe for you to be sent home with false labor. You may continue to have Braxton Hicks contractions until you go into true labor. How to tell the difference between true labor and false labor True labor  Contractions last 30-70 seconds.  Contractions become very regular.  Discomfort is usually felt in the top of the uterus, and it spreads to the lower abdomen and low back.  Contractions do not go away with walking.  Contractions usually become more intense and increase in frequency.  The cervix dilates and gets thinner. False labor  Contractions are usually shorter and not as strong as true labor contractions.  Contractions are usually irregular.  Contractions are often felt in the front of the lower abdomen and in the groin.  Contractions may go away when you walk around or change positions while lying down.  Contractions get weaker and are shorter-lasting as time goes on.  The cervix usually does not dilate or become thin. Follow these instructions at home:   Take over-the-counter and prescription medicines only as told by your health care provider.  Keep up with your usual exercises and follow other instructions from your health care provider.  Eat and drink lightly if you think you are going into labor.  If Braxton Hicks contractions are making you uncomfortable: ? Change your position from lying down or resting to walking, or change from walking to resting. ? Sit and rest in a tub of warm water. ? Drink enough fluid to keep your urine pale yellow. Dehydration may cause these contractions. ? Do slow and deep  breathing several times an hour.  Keep all follow-up prenatal visits as told by your health care provider. This is important. Contact a health care provider if:  You have a fever.  You have continuous pain in your abdomen. Get help right away if:  Your contractions become stronger, more regular, and closer together.  You have fluid leaking or gushing from your vagina.  You pass blood-tinged mucus (bloody show).  You have bleeding from your vagina.  You have low back pain that you never had before.  You feel your baby's head pushing down and causing pelvic pressure.  Your baby is not moving inside you as much as it used to. Summary  Contractions that occur before labor are called Braxton Hicks contractions, false labor, or practice contractions.  Braxton Hicks contractions are usually shorter, weaker, farther apart, and less regular than true labor contractions. True labor contractions usually become progressively   stronger and regular, and they become more frequent.  Manage discomfort from Braxton Hicks contractions by changing position, resting in a warm bath, drinking plenty of water, or practicing deep breathing. This information is not intended to replace advice given to you by your health care provider. Make sure you discuss any questions you have with your health care provider. Document Released: 08/21/2016 Document Revised: 01/20/2017 Document Reviewed: 08/21/2016 Elsevier Interactive Patient Education  2019 [employer]  

## 2018-10-14 NOTE — Discharge Instructions (Signed)

## 2018-10-19 ENCOUNTER — Encounter: Payer: Self-pay | Admitting: *Deleted

## 2018-10-20 ENCOUNTER — Telehealth (INDEPENDENT_AMBULATORY_CARE_PROVIDER_SITE_OTHER): Payer: BC Managed Care – PPO | Admitting: Advanced Practice Midwife

## 2018-10-20 ENCOUNTER — Other Ambulatory Visit: Payer: Self-pay

## 2018-10-20 VITALS — BP 120/80 | HR 99 | Wt 156.6 lb

## 2018-10-20 DIAGNOSIS — Z3A39 39 weeks gestation of pregnancy: Secondary | ICD-10-CM

## 2018-10-20 DIAGNOSIS — O480 Post-term pregnancy: Secondary | ICD-10-CM

## 2018-10-20 DIAGNOSIS — Z3403 Encounter for supervision of normal first pregnancy, third trimester: Secondary | ICD-10-CM

## 2018-10-20 NOTE — Patient Instructions (Signed)
  Cervical Ripening: May try one or both  Red Raspberry Leaf capsules:  two 300mg or 400mg tablets with each meal, 2-3 times a day  Potential Side Effects Of Raspberry Leaf:  Most women do not experience any side effects from drinking raspberry leaf tea. However, nausea and loose stools are possible     Evening Primrose Oil capsules: may take 1 to 3 capsules daily. May also prick one to release the oil and insert it into your vagina at night.  Some of the potential side effects:  Upset stomach  Loose stools or diarrhea  Headaches  Nausea:      AM I IN LABOR? What is labor? Labor is the work that your body does to birth your baby. Your uterus (the womb) contracts. Your cervix (the mouth of the uterus) opens. You will push your baby out into the world.  What do contractions (labor pains) feel like? When they first start, contractions usually feel like cramps during your period. Sometimes you feel pain in your back. Most often, contractions feel like muscles pulling painfully in your lower belly. At first, the contractions will probably be 15 to 20 minutes apart. They will not feel too painful. As labor goes on, the contractions get stronger, closer together, and more painful.  How do I time the contractions? Time your contractions by counting the number of minutes from the start of one contraction to the start of the next contraction.  What should I do when the contractions start? If it is night and you can sleep, sleep. If it happens during the day, here are some things you can do to take care of yourself at home: ? Walk. If the pains you are having are real labor, walking will make the contractions come faster and harder. If the contractions are not going to continue and be real labor, walking will make the contractions slow down. ? Take a shower or bath. This will help you relax. ? Eat. Labor is a big event. It takes a lot of energy. ? Drink water. Not drinking enough water can cause  false labor (contractions that hurt but do not open your cervix). If this is true labor, drinking water will help you have strength to get through your labor. ? Take a nap. Get all the rest you can. ? Get a massage. If your labor is in your back, a strong massage on your lower back may feel very good. Getting a foot massage is always good. ? Don't panic. You can do this. Your body was made for this. You are strong!  When should I go to the hospital or call my health care provider? ? Your contractions have been 5 minutes apart or less for at least 1 hour. ? If several contractions are so painful you cannot walk or talk during one. ? Your bag of waters breaks. (You may have a big gush of water or just water that runs down your legs when you walk.)  Are there other reasons to call my health care provider? Yes, you should call your health care provider or go to the hospital if you start to bleed like you are having a period- blood that soaks your underwear or runs down your legs, if you have sudden severe pain, if your baby has not moved for several hours, or if you are leaking green fluid. The rule is as follows: If you are very concerned about something, call.  

## 2018-10-20 NOTE — Progress Notes (Signed)
   TELEHEALTH VIRTUAL OBSTETRICS VISIT ENCOUNTER NOTE  I connected with Pamela Gallagher on 10/20/18 at  1:45 PM EDT by telephone at home and verified that I am speaking with the correct person using two identifiers.   I discussed the limitations, risks, security and privacy concerns of performing an evaluation and management service by telephone and the availability of in person appointments. I also discussed with the patient that there may be a patient responsible charge related to this service. The patient expressed understanding and agreed to proceed.  Subjective:  Pamela Gallagher is a 33 y.o. G1P0 at [redacted]w[redacted]d being followed for ongoing prenatal care.  She is currently monitored for the following issues for this low-risk pregnancy and has Dysuria; Costochondral chest pain; Anovulatory (dysfunctional uterine) bleeding; Benzodiazepine dependence ([place]); Supervision of normal first pregnancy; Depression/anxiety/PTSD; Smoker; Marijuana use; and Endometriosis on their problem list.  Patient reports no complaints. Reports fetal movement. Denies any contractions, bleeding or leaking of fluid.   The following portions of the patient's history were reviewed and updated as appropriate: allergies, current medications, past family history, past medical history, past social history, past surgical history and problem list.   Objective:   General:  Alert, oriented and cooperative.   Mental Status: Normal mood and affect perceived. Normal judgment and thought content.  Rest of physical exam deferred due to type of encounter  Assessment and Plan:  Pregnancy: G1P0 at [redacted]w[redacted]d There are no diagnoses linked to this encounter. Term labor symptoms and general obstetric precautions including but not limited to vaginal bleeding, contractions, leaking of fluid and fetal movement were reviewed in detail with the patient.  I discussed the assessment and treatment plan with the patient. The patient was provided an  opportunity to ask questions and all were answered. The patient agreed with the plan and demonstrated an understanding of the instructions. The patient was advised to call back or seek an in-person office evaluation/go to [facility]U at [facility] for any urgent or concerning symptoms. Please refer to After Visit Summary for other counseling recommendations.   Already has EPO:  Dosages given.   I provided 10 minutes of non-face-to-face time during this encounter.  Return in about 1 week (around 10/27/2018) for LROB/BPP.  No future appointments.  Christin Fudge, [place] for [employer], [place]

## 2018-10-24 ENCOUNTER — Inpatient Hospital Stay (HOSPITAL_COMMUNITY)
Admission: AD | Admit: 2018-10-24 | Discharge: 2018-10-27 | DRG: 806 | Disposition: A | Discharge status: Home / Self Care | Payer: BC Managed Care – PPO | Attending: Obstetrics and Gynecology | Admitting: Obstetrics and Gynecology

## 2018-10-24 ENCOUNTER — Encounter (HOSPITAL_COMMUNITY): Payer: Self-pay

## 2018-10-24 ENCOUNTER — Other Ambulatory Visit: Payer: Self-pay

## 2018-10-24 DIAGNOSIS — O9902 Anemia complicating childbirth: Secondary | ICD-10-CM | POA: Diagnosis not present

## 2018-10-24 DIAGNOSIS — J45991 Cough variant asthma: Secondary | ICD-10-CM | POA: Diagnosis present

## 2018-10-24 DIAGNOSIS — F17200 Nicotine dependence, unspecified, uncomplicated: Secondary | ICD-10-CM

## 2018-10-24 DIAGNOSIS — F329 Major depressive disorder, single episode, unspecified: Secondary | ICD-10-CM | POA: Diagnosis not present

## 2018-10-24 DIAGNOSIS — O9089 Other complications of the puerperium, not elsewhere classified: Secondary | ICD-10-CM | POA: Diagnosis present

## 2018-10-24 DIAGNOSIS — O99324 Drug use complicating childbirth: Secondary | ICD-10-CM | POA: Diagnosis not present

## 2018-10-24 DIAGNOSIS — Z1159 Encounter for screening for other viral diseases: Secondary | ICD-10-CM

## 2018-10-24 DIAGNOSIS — O9952 Diseases of the respiratory system complicating childbirth: Secondary | ICD-10-CM | POA: Diagnosis not present

## 2018-10-24 DIAGNOSIS — Z3403 Encounter for supervision of normal first pregnancy, third trimester: Secondary | ICD-10-CM

## 2018-10-24 DIAGNOSIS — F410 Panic disorder [episodic paroxysmal anxiety] without agoraphobia: Secondary | ICD-10-CM | POA: Diagnosis present

## 2018-10-24 DIAGNOSIS — Z23 Encounter for immunization: Secondary | ICD-10-CM | POA: Diagnosis not present

## 2018-10-24 DIAGNOSIS — F4310 Post-traumatic stress disorder, unspecified: Secondary | ICD-10-CM | POA: Diagnosis not present

## 2018-10-24 DIAGNOSIS — O4290 Premature rupture of membranes, unspecified as to length of time between rupture and onset of labor, unspecified weeks of gestation: Secondary | ICD-10-CM | POA: Diagnosis present

## 2018-10-24 DIAGNOSIS — J4520 Mild intermittent asthma, uncomplicated: Secondary | ICD-10-CM | POA: Diagnosis present

## 2018-10-24 DIAGNOSIS — I959 Hypotension, unspecified: Secondary | ICD-10-CM | POA: Diagnosis not present

## 2018-10-24 DIAGNOSIS — F1290 Cannabis use, unspecified, uncomplicated: Secondary | ICD-10-CM | POA: Diagnosis present

## 2018-10-24 DIAGNOSIS — O26893 Other specified pregnancy related conditions, third trimester: Secondary | ICD-10-CM | POA: Diagnosis present

## 2018-10-24 DIAGNOSIS — Z3A39 39 weeks gestation of pregnancy: Secondary | ICD-10-CM | POA: Diagnosis not present

## 2018-10-24 DIAGNOSIS — Z87891 Personal history of nicotine dependence: Secondary | ICD-10-CM

## 2018-10-24 DIAGNOSIS — O99344 Other mental disorders complicating childbirth: Secondary | ICD-10-CM | POA: Diagnosis present

## 2018-10-24 DIAGNOSIS — D649 Anemia, unspecified: Secondary | ICD-10-CM | POA: Diagnosis present

## 2018-10-24 DIAGNOSIS — O4292 Full-term premature rupture of membranes, unspecified as to length of time between rupture and onset of labor: Principal | ICD-10-CM | POA: Diagnosis present

## 2018-10-24 DIAGNOSIS — F32A Depression, unspecified: Secondary | ICD-10-CM | POA: Diagnosis present

## 2018-10-24 DIAGNOSIS — J45909 Unspecified asthma, uncomplicated: Secondary | ICD-10-CM | POA: Diagnosis present

## 2018-10-24 LAB — CBC
HCT: 33.3 % — ABNORMAL LOW (ref 36.0–46.0)
Hemoglobin: 10.9 g/dL — ABNORMAL LOW (ref 12.0–15.0)
MCH: 27.5 pg (ref 26.0–34.0)
MCHC: 32.7 g/dL (ref 30.0–36.0)
MCV: 83.9 fL (ref 80.0–100.0)
Platelets: 270 10*3/uL (ref 150–400)
RBC: 3.97 MIL/uL (ref 3.87–5.11)
RDW: 13.4 % (ref 11.5–15.5)
WBC: 9.8 10*3/uL (ref 4.0–10.5)
nRBC: 0 % (ref 0.0–0.2)

## 2018-10-24 LAB — TYPE AND SCREEN
ABO/RH(D): O POS
Antibody Screen: NEGATIVE

## 2018-10-24 MED ORDER — LACTATED RINGERS IV SOLN
500.0000 mL | INTRAVENOUS | Status: DC | PRN
  Administered 2018-10-25: 500 mL via INTRAVENOUS

## 2018-10-24 MED ORDER — ONDANSETRON HCL 4 MG/2ML IJ SOLN
4.0000 mg | Freq: Four times a day (QID) | INTRAMUSCULAR | Status: DC | PRN
  Administered 2018-10-25: 4 mg via INTRAVENOUS
  Filled 2018-10-24: qty 2

## 2018-10-24 MED ORDER — LACTATED RINGERS IV SOLN
500.0000 mL | Freq: Once | INTRAVENOUS | Status: AC
  Administered 2018-10-25: 500 mL via INTRAVENOUS

## 2018-10-24 MED ORDER — FENTANYL-BUPIVACAINE-NACL 0.5-0.125-0.9 MG/250ML-% EP SOLN
EPIDURAL | Status: AC
  Filled 2018-10-24: qty 250

## 2018-10-24 MED ORDER — LACTATED RINGERS IV SOLN
INTRAVENOUS | Status: DC
  Administered 2018-10-24 – 2018-10-25 (×3): via INTRAVENOUS

## 2018-10-24 MED ORDER — ACETAMINOPHEN 325 MG PO TABS: 650.0000 mg | ORAL_TABLET | ORAL | Status: DC | PRN

## 2018-10-24 MED ORDER — FENTANYL CITRATE (PF) 100 MCG/2ML IJ SOLN
100.0000 ug | Freq: Once | INTRAMUSCULAR | Status: AC
  Administered 2018-10-24: 100 ug via INTRAVENOUS

## 2018-10-24 MED ORDER — FENTANYL-BUPIVACAINE-NACL 0.5-0.125-0.9 MG/250ML-% EP SOLN: 12.0000 mL/h | EPIDURAL | Status: DC | PRN

## 2018-10-24 MED ORDER — LIDOCAINE HCL (PF) 1 % IJ SOLN: 30.0000 mL | INTRAMUSCULAR | Status: DC | PRN

## 2018-10-24 MED ORDER — SOD CITRATE-CITRIC ACID 500-334 MG/5ML PO SOLN: 30.0000 mL | ORAL | Status: DC | PRN

## 2018-10-24 MED ORDER — EPHEDRINE 5 MG/ML INJ: 10.0000 mg | INTRAVENOUS | Status: DC | PRN

## 2018-10-24 MED ORDER — DIPHENHYDRAMINE HCL 50 MG/ML IJ SOLN: 12.5000 mg | INTRAMUSCULAR | Status: DC | PRN

## 2018-10-24 MED ORDER — PHENYLEPHRINE 40 MCG/ML (10ML) SYRINGE FOR IV PUSH (FOR BLOOD PRESSURE SUPPORT)
80.0000 ug | PREFILLED_SYRINGE | INTRAVENOUS | Status: AC | PRN
  Administered 2018-10-25 (×3): 80 ug via INTRAVENOUS
  Filled 2018-10-24 (×3): qty 10

## 2018-10-24 MED ORDER — FENTANYL CITRATE (PF) 100 MCG/2ML IJ SOLN
INTRAMUSCULAR | Status: AC
  Filled 2018-10-24: qty 2

## 2018-10-24 MED ORDER — OXYCODONE-ACETAMINOPHEN 5-325 MG PO TABS: 1.0000 | ORAL_TABLET | ORAL | Status: DC | PRN

## 2018-10-24 MED ORDER — PROMETHAZINE HCL 12.5 MG PO TABS
12.5000 mg | ORAL_TABLET | Freq: Once | ORAL | Status: AC
  Filled 2018-10-24: qty 1

## 2018-10-24 MED ORDER — PHENYLEPHRINE 40 MCG/ML (10ML) SYRINGE FOR IV PUSH (FOR BLOOD PRESSURE SUPPORT)
80.0000 ug | PREFILLED_SYRINGE | INTRAVENOUS | Status: AC | PRN
  Administered 2018-10-25 (×3): 80 ug via INTRAVENOUS

## 2018-10-24 MED ORDER — PROMETHAZINE HCL 25 MG/ML IJ SOLN
12.5000 mg | Freq: Once | INTRAMUSCULAR | Status: AC
  Administered 2018-10-25: 12.5 mg via INTRAVENOUS
  Filled 2018-10-24: qty 1

## 2018-10-24 MED ORDER — OXYCODONE-ACETAMINOPHEN 5-325 MG PO TABS: 2.0000 | ORAL_TABLET | ORAL | Status: DC | PRN

## 2018-10-24 MED ORDER — OXYTOCIN BOLUS FROM INFUSION
500.0000 mL | Freq: Once | INTRAVENOUS | Status: AC
  Administered 2018-10-25: 500 mL via INTRAVENOUS

## 2018-10-24 MED ORDER — OXYTOCIN 40 UNITS IN NORMAL SALINE INFUSION - SIMPLE MED
2.5000 [IU]/h | INTRAVENOUS | Status: DC
  Filled 2018-10-24: qty 1000

## 2018-10-24 NOTE — MAU Note (Signed)
Been contracting all day and says that contractions are  Now 2-3 min apart for the past hour. Denies vag bleeding or leaking. Pos fetal movement.

## 2018-10-24 NOTE — H&P (Addendum)
LABOR AND DELIVERY ADMISSION HISTORY AND PHYSICAL NOTE  Pamela Gallagher is a 33 y.o. female G1P0 with IUP at 272w5d by 9 week U/S presenting for early labor and ROM with ,meconium fluid.  She reports positive fetal movement. She denies vaginal bleeding.   Prenatal History/Complications: [facility]  Pregnancy complications:  - Depression/anxiety/PTSD: currently on Zoloft 25mg  and Vistaril 25mg  PRN - THC and tobacco use during pregnancy (quit by 2nd trimester)   - Endometriosis  - History of benzodiazapine dependence, previously on Xanax  - Mild intermittent asthma, albuterol PRN   Past Medical History: Past Medical History:  Diagnosis Date  . Abnormal Pap smear 2011  . Anxiety and depression   . Asthma   . Endometriosis   . PONV (postoperative nausea and vomiting)   . PTSD (post-traumatic stress disorder)    "adultified child"  . UTI (lower urinary tract infection)   . Vaginal Pap smear, abnormal     Past Surgical History: Past Surgical History:  Procedure Laterality Date  . BIOPSY  11/27/2014   Procedure: BIOPSY of endometriosis;  Surgeon: Osborn CohoAngela Roberts, MD;  Location: [facility];  Service: Gynecology;;  . Christianne BorrowHROMOPERTUBATION  11/27/2014   Procedure: CHROMOPERTUBATION;  Surgeon: Osborn CohoAngela Roberts, MD;  Location: [facility];  Service: Gynecology;;  . LAPAROSCOPY N/A 11/27/2014   Procedure: LAPAROSCOPY OPERATIVE;  Surgeon: Osborn CohoAngela Roberts, MD;  Location: [facility];  Service: Gynecology;  Laterality: N/A;  . thyroglossoduct cyst     x 2  . WISDOM TOOTH EXTRACTION      Obstetrical History: OB History    Gravida  1   Para      Term      Preterm      AB      Living        SAB      TAB      Ectopic      Multiple      Live Births              Social History: Social History   Socioeconomic History  . Marital status: Single    Spouse name: Gerilyn PilgrimJacob  . Number of children: Not on file  . Years of education: Not on file  . Highest education level: Not on file   Occupational History  . Not on file  Social Needs  . Financial resource strain: Not hard at all  . Food insecurity    Worry: Never true    Inability: Never true  . Transportation needs    Medical: No    Non-medical: No  Tobacco Use  . Smoking status: Former Smoker    Packs/day: 0.25    Years: 0.50    Pack years: 0.12    Types: Cigarettes    Quit date: 05/31/2018    Years since quitting: 0.4  . Smokeless tobacco: Never Used  . Tobacco comment: 3 cigs per day  Substance and Sexual Activity  . Alcohol use: Not Currently    Comment: social  . Drug use: No  . Sexual activity: Yes    Birth control/protection: None  Lifestyle  . Physical activity    Days per week: 2 days    Minutes per session: 40 min  . Stress: Only a little  Relationships  . Social connections    Talks on phone: More than three times a week    Gets together: Once a week    Attends religious service: Never    Active member of club or  organization: No    Attends meetings of clubs or organizations: Never    Relationship status: Living with partner  Other Topics Concern  . Not on file  Social History Narrative  . Not on file    Family History: Family History  Problem Relation Age of Onset  . Depression Mother   . Anxiety disorder Mother   . Depression Father   . Hearing loss Paternal Uncle   . Aneurysm Maternal Aunt     Allergies: Allergies  Allergen Reactions  . Clindamycin/Lincomycin Shortness Of Breath  . Penicillins Anaphylaxis    Has patient had a PCN reaction causing immediate rash, facial/tongue/throat swelling, SOB or lightheadedness with hypotension: Yes Has patient had a PCN reaction causing severe rash involving mucus membranes or skin necrosis: Yes Has patient had a PCN reaction that required hospitalization Yes Has patient had a PCN reaction occurring within the last 10 years: No If all of the above answers are "NO", then may proceed with Cephalosporin use.   . Sulfa Antibiotics  Anaphylaxis    Medications Prior to Admission  Medication Sig Dispense Refill Last Dose  . albuterol (PROVENTIL HFA;VENTOLIN HFA) 108 (90 BASE) MCG/ACT inhaler Inhale 2 puffs into the lungs every 6 (six) hours as needed. Wheezing (Patient not taking: Reported on 09/16/2018)     . cholecalciferol (VITAMIN D3) 25 MCG (1000 UT) tablet Take 4,000 Units by mouth daily.     . cyclobenzaprine (FLEXERIL) 10 MG tablet Take 1 tablet (10 mg total) by mouth every 8 (eight) hours as needed for muscle spasms. 30 tablet 1   . hydrOXYzine (ATARAX/VISTARIL) 25 MG tablet Take 1 tablet (25 mg total) by mouth 3 (three) times daily as needed for anxiety. 30 tablet 0   . ipratropium-albuterol (DUONEB) 0.5-2.5 (3) MG/3ML SOLN Inhale 3 mLs into the lungs every 4 (four) hours as needed (FOR WHEEZING AND SHORTNESS OF BREATH).      Marland Kitchen. omeprazole (PRILOSEC) 20 MG capsule Take 1 capsule (20 mg total) by mouth daily. 30 capsule 3   . Prenatal Vit-Fe Fumarate-FA (PRENATAL VITAMIN PO) Take by mouth.     . sertraline (ZOLOFT) 25 MG tablet Take 1 tablet (25 mg total) by mouth daily. 30 tablet 3      Review of Systems  All systems reviewed and negative except as stated in HPI  Physical Exam Blood pressure 119/81, pulse (!) 119, last menstrual period 10/17/2017. General appearance: alert, oriented, in acute distress with contractions  Lungs: normal respiratory effort Heart: regular rate Abdomen: soft, non-tender; gravid, FH appropriate for GA Extremities: No calf swelling or tenderness Presentation: cephalic Fetal monitoring: baseline 130, mod var, + accel, - decel  Uterine activity: every 1-3 minutes  Dilation: 1 Effacement (%): 90 Exam by:: Elie ConferK. WEiss RN  Prenatal labs: ABO, Rh: O/Positive/-- (01/14 1526) Antibody: Negative (04/01 0839) Rubella: 9.13 (01/14 1526) RPR: Non Reactive (04/01 0839)  HBsAg: Negative (01/14 1526)  HIV: Non Reactive (04/01 0839)  GC/Chlamydia: negative  GBS:   Negative  2-hr GTT: normal   Genetic screening: AFP negative  Anatomy US: Normal female   Prenatal Transfer Tool  Maternal Diabetes: No Genetic Screening: Normal Maternal Ultrasounds/Referrals: Normal Fetal Ultrasounds or other Referrals:  None Maternal Substance Abuse:  Yes:  Type: Smoker, Marijuana Significant Maternal Medications:  Meds include: Zoloft Significant Maternal Lab Results: Group B Strep negative  No results found for this or any previous visit (from the past 24 hour(s)).  Patient Active Problem List   Diagnosis Date Noted  .  Endometriosis 05/12/2018  . Marijuana use 05/10/2018  . Depression/anxiety/PTSD 05/04/2018  . Smoker 05/04/2018  . Supervision of normal first pregnancy 04/30/2018  . Benzodiazepine dependence ([place]) 10/07/2012  . Anovulatory (dysfunctional uterine) bleeding 08/23/2012  . Costochondral chest pain 02/10/2012  . Dysuria 01/14/2012    Assessment: Pamela Gallagher is a 33 y.o. G1P0 at [redacted]w[redacted]d here for early labor. Pregnancy complicated by depression/anxiety/PTSD currently on Zoloft, THC and tobacco use during pregnancy, endometriosis, and mild intermittent asthma.  #Labor: Expectant management for now with frequent contractions, serial cervical exams. May consider FB if minimal cervical change on next check.  #Pain: Epidural placed  #FWB: Cat 1 strip  #ID: GBS negative  #MOF:  Breastfeeding  #MOC: POPs   #Circ: N/A  1. Depression and anxiety: Stable.  Currently on Zoloft and prn Atarax, well controlled.  --SW consult   2. Tobacco and THC use during pregnancy:  Reports cessation by 2nd trimester.  --SW as above   Patriciaann Clan 10/24/2018, 10:29 PM   I confirm that I have verified the information documented in the resident's note and that I have also personally reperformed the physical exam and all medical decision making activities. The patient was seen and examined by me also Agree with note NST reactive and reassuring UCs as listed Cervical exams as listed  in note  Seabron Spates, CNM

## 2018-10-25 ENCOUNTER — Inpatient Hospital Stay (HOSPITAL_COMMUNITY): Payer: BC Managed Care – PPO | Admitting: Anesthesiology

## 2018-10-25 ENCOUNTER — Encounter (HOSPITAL_COMMUNITY): Payer: Self-pay | Admitting: *Deleted

## 2018-10-25 DIAGNOSIS — Z3A39 39 weeks gestation of pregnancy: Secondary | ICD-10-CM

## 2018-10-25 DIAGNOSIS — Z029 Encounter for administrative examinations, unspecified: Secondary | ICD-10-CM

## 2018-10-25 DIAGNOSIS — J45991 Cough variant asthma: Secondary | ICD-10-CM

## 2018-10-25 DIAGNOSIS — J45909 Unspecified asthma, uncomplicated: Secondary | ICD-10-CM | POA: Diagnosis present

## 2018-10-25 HISTORY — DX: Cough variant asthma: J45.991

## 2018-10-25 LAB — SARS CORONAVIRUS 2 BY RT PCR (HOSPITAL ORDER, PERFORMED IN ~~LOC~~ HOSPITAL LAB): SARS Coronavirus 2: NEGATIVE

## 2018-10-25 LAB — POCT FERN TEST: POCT Fern Test: POSITIVE

## 2018-10-25 LAB — ABO/RH: ABO/RH(D): O POS

## 2018-10-25 LAB — RPR: RPR Ser Ql: NONREACTIVE

## 2018-10-25 MED ORDER — TETANUS-DIPHTH-ACELL PERTUSSIS 5-2.5-18.5 LF-MCG/0.5 IM SUSP: 0.5000 mL | Freq: Once | INTRAMUSCULAR | Status: DC

## 2018-10-25 MED ORDER — COCONUT OIL OIL
1.0000 "application " | TOPICAL_OIL | Status: DC | PRN
  Administered 2018-10-26: 1 via TOPICAL

## 2018-10-25 MED ORDER — LIDOCAINE-EPINEPHRINE (PF) 2 %-1:200000 IJ SOLN
INTRAMUSCULAR | Status: DC | PRN
  Administered 2018-10-25: 5 mg via INTRADERMAL

## 2018-10-25 MED ORDER — IBUPROFEN 600 MG PO TABS
600.0000 mg | ORAL_TABLET | Freq: Four times a day (QID) | ORAL | Status: DC
  Administered 2018-10-25 – 2018-10-27 (×7): 600 mg via ORAL
  Filled 2018-10-25 (×7): qty 1

## 2018-10-25 MED ORDER — ZOLPIDEM TARTRATE 5 MG PO TABS: 5.0000 mg | ORAL_TABLET | Freq: Every evening | ORAL | Status: DC | PRN

## 2018-10-25 MED ORDER — PRENATAL MULTIVITAMIN CH
1.0000 | ORAL_TABLET | Freq: Every day | ORAL | Status: DC
  Administered 2018-10-26: 1 via ORAL
  Filled 2018-10-25: qty 1

## 2018-10-25 MED ORDER — LIDOCAINE HCL (PF) 1 % IJ SOLN
INTRAMUSCULAR | Status: DC | PRN
  Administered 2018-10-25: 6 mL via EPIDURAL
  Administered 2018-10-25: 8 mL via EPIDURAL

## 2018-10-25 MED ORDER — SERTRALINE HCL 25 MG PO TABS
25.0000 mg | ORAL_TABLET | Freq: Every day | ORAL | Status: DC
  Administered 2018-10-26 – 2018-10-27 (×2): 25 mg via ORAL
  Filled 2018-10-25 (×3): qty 1

## 2018-10-25 MED ORDER — WITCH HAZEL-GLYCERIN EX PADS
1.0000 "application " | MEDICATED_PAD | CUTANEOUS | Status: DC | PRN
  Administered 2018-10-26: 1 via TOPICAL

## 2018-10-25 MED ORDER — ONDANSETRON HCL 4 MG PO TABS: 4.0000 mg | ORAL_TABLET | ORAL | Status: DC | PRN

## 2018-10-25 MED ORDER — ACETAMINOPHEN 500 MG PO TABS
1000.0000 mg | ORAL_TABLET | Freq: Once | ORAL | Status: AC
  Administered 2018-10-25: 1000 mg via ORAL
  Filled 2018-10-25: qty 2

## 2018-10-25 MED ORDER — DIBUCAINE (PERIANAL) 1 % EX OINT
1.0000 "application " | TOPICAL_OINTMENT | CUTANEOUS | Status: DC | PRN
  Administered 2018-10-26: 1 via RECTAL
  Filled 2018-10-25: qty 28

## 2018-10-25 MED ORDER — PHENYLEPHRINE 40 MCG/ML (10ML) SYRINGE FOR IV PUSH (FOR BLOOD PRESSURE SUPPORT)
80.0000 ug | PREFILLED_SYRINGE | INTRAVENOUS | Status: DC | PRN
  Administered 2018-10-25 (×5): 80 ug via INTRAVENOUS

## 2018-10-25 MED ORDER — SENNOSIDES-DOCUSATE SODIUM 8.6-50 MG PO TABS
2.0000 | ORAL_TABLET | ORAL | Status: DC
  Administered 2018-10-25 – 2018-10-26 (×2): 2 via ORAL
  Filled 2018-10-25 (×2): qty 2

## 2018-10-25 MED ORDER — ACETAMINOPHEN 325 MG PO TABS
650.0000 mg | ORAL_TABLET | ORAL | Status: DC | PRN
  Administered 2018-10-25: 650 mg via ORAL
  Filled 2018-10-25: qty 2

## 2018-10-25 MED ORDER — LACTATED RINGERS AMNIOINFUSION
INTRAVENOUS | Status: DC
  Administered 2018-10-25 (×2): via INTRAUTERINE

## 2018-10-25 MED ORDER — SODIUM CHLORIDE (PF) 0.9 % IJ SOLN
INTRAMUSCULAR | Status: DC | PRN
  Administered 2018-10-25: 12 mL/h via EPIDURAL

## 2018-10-25 MED ORDER — TERBUTALINE SULFATE 1 MG/ML IJ SOLN: 0.2500 mg | Freq: Once | INTRAMUSCULAR | Status: DC | PRN

## 2018-10-25 MED ORDER — OXYTOCIN 40 UNITS IN NORMAL SALINE INFUSION - SIMPLE MED
1.0000 m[IU]/min | INTRAVENOUS | Status: DC
  Administered 2018-10-25: 1.333 m[IU]/min via INTRAVENOUS

## 2018-10-25 MED ORDER — BENZOCAINE-MENTHOL 20-0.5 % EX AERO
1.0000 "application " | INHALATION_SPRAY | CUTANEOUS | Status: DC | PRN
  Administered 2018-10-25 – 2018-10-26 (×2): 1 via TOPICAL
  Filled 2018-10-25 (×2): qty 56

## 2018-10-25 MED ORDER — PNEUMOCOCCAL VAC POLYVALENT 25 MCG/0.5ML IJ INJ: 0.5000 mL | INJECTION | INTRAMUSCULAR | Status: DC

## 2018-10-25 MED ORDER — DIPHENHYDRAMINE HCL 25 MG PO CAPS: 25.0000 mg | ORAL_CAPSULE | Freq: Four times a day (QID) | ORAL | Status: DC | PRN

## 2018-10-25 MED ORDER — ONDANSETRON HCL 4 MG/2ML IJ SOLN: 4.0000 mg | INTRAMUSCULAR | Status: DC | PRN

## 2018-10-25 MED ORDER — SIMETHICONE 80 MG PO CHEW
80.0000 mg | CHEWABLE_TABLET | ORAL | Status: DC | PRN
  Filled 2018-10-25: qty 1

## 2018-10-25 NOTE — Progress Notes (Signed)
LABOR PROGRESS NOTE  Pamela Gallagher is a 33 y.o. G1P0 at [redacted]w[redacted]d  admitted for ROM.   Subjective: Feeling nauseous. High spinal placed- previously having numbness in upper extremity which has since resolved. Minimal contraction pain remaining.   Objective: BP 133/82   Pulse (!) 108   Temp 98.3 F (36.8 C) (Oral)   Resp 18   Ht 5\' 3"  (1.6 m)   Wt 71.4 kg   LMP 10/17/2017   SpO2 99%   BMI 27.88 kg/m  or  Vitals:   10/25/18 0630 10/25/18 0643 10/25/18 0701 10/25/18 0731  BP: (!) 110/47  117/84 133/82  Pulse: (!) 105  (!) 118 (!) 108  Resp: 16  17 18   Temp:  97.6 F (36.4 C)  98.3 F (36.8 C)  TempSrc:  Axillary  Oral  SpO2:      Weight:      Height:        Dilation: 5 Effacement (%): 80 Cervical Position: Anterior Station: 0 Presentation: Vertex Exam by:: Mortimer Fries, RN FHT: baseline rate 130, moderate varibility, +acel, -decel Toco: every 2-3   Labs: Lab Results  Component Value Date   WBC 9.8 10/24/2018   HGB 10.9 (L) 10/24/2018   HCT 33.3 (L) 10/24/2018   MCV 83.9 10/24/2018   PLT 270 10/24/2018    Patient Active Problem List   Diagnosis Date Noted  . PROM (premature rupture of membranes) 10/24/2018  . Endometriosis 05/12/2018  . Marijuana use 05/10/2018  . Depression/anxiety/PTSD 05/04/2018  . Smoker 05/04/2018  . Supervision of normal first pregnancy 04/30/2018  . Benzodiazepine dependence ([place]) 10/07/2012  . Anovulatory (dysfunctional uterine) bleeding 08/23/2012  . Costochondral chest pain 02/10/2012  . Dysuria 01/14/2012    Assessment / Plan: 33 y.o. G1P0 at [redacted]w[redacted]d here for SOL, SROM.   Labor: Expectant management for now for recovery from hyperstimulation/hypotension associated with high epidural. Making cervical change- but may consider IUPC placement/pit pending on next evaluation.    Fetal Wellbeing:  Cat 1 strip  Pain Control:  Epidural placed  Anticipated MOD:  SVD   Darrelyn Hillock, D.O. Family Medicine PGY-2 10/25/2018, 7:53 AM

## 2018-10-25 NOTE — Anesthesia Procedure Notes (Signed)
Epidural Patient location during procedure: OB Start time: 10/25/2018 12:29 AM End time: 10/25/2018 12:31 AM  Staffing Anesthesiologist: Lyn Hollingshead, MD Performed: anesthesiologist   Preanesthetic Checklist Completed: patient identified, site marked, surgical consent, pre-op evaluation, timeout performed, IV checked, risks and benefits discussed and monitors and equipment checked  Epidural Patient position: sitting Prep: site prepped and draped and DuraPrep Patient monitoring: continuous pulse ox and blood pressure Approach: midline Location: L3-L4 Injection technique: LOR air  Needle:  Needle type: Tuohy  Needle gauge: 17 G Needle length: 9 cm and 9 Needle insertion depth: 6 cm Catheter type: closed end flexible Catheter size: 19 Gauge Catheter at skin depth: 11 cm Test dose: negative and Other  Assessment Sensory level: T9 Events: blood not aspirated, injection not painful, no injection resistance, negative IV test and no paresthesia  Additional Notes Reason for block:procedure for pain

## 2018-10-25 NOTE — Anesthesia Preprocedure Evaluation (Signed)
Anesthesia Evaluation  Patient identified by MRN, date of birth, ID band Patient awake    Reviewed: Allergy & Precautions, H&P , NPO status , Patient's Chart, lab work & pertinent test results  History of Anesthesia Complications (+) PONV  Airway Mallampati: I  TM Distance: >3 FB Neck ROM: full    Dental no notable dental hx. (+) Teeth Intact   Pulmonary former smoker,    Pulmonary exam normal breath sounds clear to auscultation       Cardiovascular negative cardio ROS Normal cardiovascular exam Rhythm:regular Rate:Normal     Neuro/Psych PSYCHIATRIC DISORDERS Anxiety Depression negative neurological ROS     GI/Hepatic negative GI ROS, Neg liver ROS,   Endo/Other  negative endocrine ROS  Renal/GU negative Renal ROS     Musculoskeletal   Abdominal Normal abdominal exam  (+)   Peds  Hematology negative hematology ROS (+)   Anesthesia Other Findings   Reproductive/Obstetrics (+) Pregnancy                             Anesthesia Physical Anesthesia Plan  ASA: II  Anesthesia Plan: Epidural   Post-op Pain Management:    Induction:   PONV Risk Score and Plan:   Airway Management Planned:   Additional Equipment:   Intra-op Plan:   Post-operative Plan:   Informed Consent: I have reviewed the patients History and Physical, chart, labs and discussed the procedure including the risks, benefits and alternatives for the proposed anesthesia with the patient or authorized representative who has indicated his/her understanding and acceptance.       Plan Discussed with:   Anesthesia Plan Comments:         Anesthesia Quick Evaluation

## 2018-10-25 NOTE — Discharge Summary (Addendum)
Postpartum Discharge Summary     Patient Name: Pamela Gallagher DOB: 1985-10-11 MRN: [id number]  Date of admission: 10/24/2018 Delivering Provider: Glenice Bow   Date of discharge: 10/27/2018  Admitting diagnosis: CTX q2to76mins Intrauterine pregnancy: [redacted]w[redacted]d     Secondary diagnosis:  Active Problems:   Depression   Marijuana use   PROM (premature rupture of membranes)   Asthma   Vacuum-assisted vaginal delivery  Additional problems: none     Discharge diagnosis: Term Pregnancy Delivered                                                                                                Post partum procedures:none  Augmentation: Pitocin  Complications: None  Hospital course:  Onset of Labor With Vaginal Delivery     33 y.o. yo G1P0 at [redacted]w[redacted]d was admitted in Latent Labor with SROM on 10/24/2018. Patient had a labor course remarkable for receiving an epidural that ended up being a high spinal resulting in hypotension and fetal bradycardia- resolved. Once she reached 5cm her ctx spaced out and Pitocin was required for augmentation. With pushing she had repetitive deep variables and was consented for an outlet vacuum to expedite delivery.  Membrane Rupture Time/Date: 10:00 PM ,10/24/2018   Intrapartum Procedures: Episiotomy: None [1]                                         Lacerations:  1st degree [2];Vaginal [6]  Patient had a delivery of a Viable infant. 10/25/2018  Information for the patient's newborn:  Jahlisa, Rossitto [[id number]]     Pateint had an uncomplicated postpartum course. SW consult ordered to eval for depression/anxiety   She is ambulating, tolerating a regular diet, passing flatus, and urinating well. Patient is discharged home in stable condition on 10/27/18.   Magnesium Sulfate recieved: No BMZ received: No  Physical exam  Vitals:   10/26/18 0811 10/26/18 1402 10/26/18 2142 10/27/18 0615  BP: 115/75 124/76 125/83 126/79  Pulse: 90 (!) 104 (!) 111 98  Resp: 20  18 17 16   Temp: 97.8 F (36.6 C) 98.5 F (36.9 C) 98.9 F (37.2 C) 98.3 F (36.8 C)  TempSrc:  Oral Oral Oral  SpO2:      Weight:      Height:       General: alert and cooperative Lochia: appropriate Uterine Fundus: firm Incision: N/A DVT Evaluation: Negative Homan's sign. No cords or calf tenderness. Calf/Ankle edema is present Labs: Lab Results  Component Value Date   WBC 20.4 (H) 10/26/2018   HGB 7.7 (L) 10/26/2018   HCT 23.9 (L) 10/26/2018   MCV 84.2 10/26/2018   PLT 209 10/26/2018   CMP Latest Ref Rng & Units 09/15/2015  Glucose 65 - 99 mg/dL 62(L)  BUN 6 - 20 mg/dL 12  Creatinine 0.44 - 1.00 mg/dL 1.03(H)  Sodium 135 - 145 mmol/L 134(L)  Potassium 3.5 - 5.1 mmol/L 3.1(L)  Chloride 101 - 111 mmol/L 101  CO2 22 - 32  mmol/L 22  Calcium 8.9 - 10.3 mg/dL 1.6(X8.8(L)  Total Protein 6.5 - 8.1 g/dL -  Total Bilirubin 0.3 - 1.2 mg/dL -  Alkaline Phos 38 - 096126 U/L -  AST 15 - 41 U/L -  ALT 14 - 54 U/L -    Discharge instruction: per After Visit Summary and "Baby and Me Booklet".  After visit meds:  Allergies as of 10/27/2018      Reactions   Clindamycin/lincomycin Shortness Of Breath   Penicillins Anaphylaxis   Has patient had a PCN reaction causing immediate rash, facial/tongue/throat swelling, SOB or lightheadedness with hypotension: Yes Has patient had a PCN reaction causing severe rash involving mucus membranes or skin necrosis: Yes Has patient had a PCN reaction that required hospitalization Yes Has patient had a PCN reaction occurring within the last 10 years: No If all of the above answers are "NO", then may proceed with Cephalosporin use.   Sulfa Antibiotics Anaphylaxis      Medication List    TAKE these medications   albuterol 108 (90 Base) MCG/ACT inhaler Commonly known as: VENTOLIN HFA Inhale 2 puffs into the lungs every 6 (six) hours as needed. Wheezing   cholecalciferol 25 MCG (1000 UT) tablet Commonly known as: VITAMIN D3 Take 4,000 Units by mouth  daily.   cyclobenzaprine 10 MG tablet Commonly known as: FLEXERIL Take 1 tablet (10 mg total) by mouth every 8 (eight) hours as needed for muscle spasms.   hydrOXYzine 25 MG tablet Commonly known as: ATARAX/VISTARIL Take 1 tablet (25 mg total) by mouth 3 (three) times daily as needed for anxiety.   ibuprofen 600 MG tablet Commonly known as: ADVIL Take 1 tablet (600 mg total) by mouth every 6 (six) hours.   ipratropium-albuterol 0.5-2.5 (3) MG/3ML Soln Commonly known as: DUONEB Inhale 3 mLs into the lungs every 4 (four) hours as needed (FOR WHEEZING AND SHORTNESS OF BREATH).   norethindrone 0.35 MG tablet Commonly known as: MICRONOR Take 1 tablet (0.35 mg total) by mouth daily.   omeprazole 20 MG capsule Commonly known as: PRILOSEC Take 1 capsule (20 mg total) by mouth daily.   PRENATAL VITAMIN PO Take by mouth.   sertraline 25 MG tablet Commonly known as: ZOLOFT Take 1 tablet (25 mg total) by mouth daily.       Diet: routine diet  Activity: Advance as tolerated. Pelvic rest for 6 weeks.   Outpatient follow up:4 weeks Follow up Appt: Future Appointments  Date Time Provider Department Center  11/30/2018  2:00 PM Cresenzo-Dishmon, Scarlette CalicoFrances, CNM [facility]   Follow up Visit: Follow-up Information    [facility]. Schedule an appointment as soon as possible for a visit in 4 week(s).   Contact information: [address] [place] [phone]          Please schedule this patient for Postpartum visit in: 4 weeks with the following provider: Any provider For C/S patients schedule nurse incision check in weeks 2 weeks: no Low risk pregnancy complicated by: hx anxiety/depression- on meds Delivery mode:  Vacuum Anticipated Birth Control:  POPs PP Procedures needed: none  Schedule Integrated BH visit: no   Newborn Data: Live born female  Birth Weight: 3130 gm (6lb 14.4oz) APGAR: 9, 9  Newborn Delivery   Birth  date/time: 10/25/2018 14:14:00 Delivery type: Vaginal, Vacuum (Extractor)      Baby Feeding: Breast Disposition:home with mother   10/27/2018 Wynelle BourgeoisMarie December Hedtke, CNM

## 2018-10-25 NOTE — Progress Notes (Signed)
Patient ID: MONTI VILLERS, female   DOB: Jun 23, 1985, 33 y.o.   MRN: [id number] Late Entry for [id number]  Called to room for FHR down to 80s  Patient had just received an epidural and BPs were trending down Usual measures were being undertaken including fluids and meds CRNA was in the room  IUPC inserted ISE inserted Copious clear/blood tinged fluid returned.   WIll start amnioinfusion.  FHR stabilized after a 5 min decel and is now more reassuring with intermittent variable decels  Dilation: 3 Effacement (%): 90 Cervical Position: Posterior Station: -2 Presentation: Vertex Exam by:: Sheletha Bow cnm

## 2018-10-25 NOTE — Progress Notes (Signed)
LABOR PROGRESS NOTE  Pamela Gallagher is a 33 y.o. G1P0 at [redacted]w[redacted]d  admitted for SOL with SROM with meconium.  Subjective: She is doing well. She is having minimal discomfort with contractions.  Objective: BP 97/76   Pulse (!) 115   Temp 98.3 F (36.8 C) (Oral)   Resp 18   Ht 5\' 3"  (1.6 m)   Wt 71.4 kg   LMP 10/17/2017   SpO2 99%   BMI 27.88 kg/m  or  Vitals:   10/25/18 0801 10/25/18 0831 10/25/18 0901 10/25/18 0933  BP: 111/69 113/70 (!) 113/56 97/76  Pulse: (!) 103 (!) 106 (!) 102 (!) 115  Resp: 18 18 18 18   Temp:      TempSrc:      SpO2:      Weight:      Height:        Dilation: 5 Effacement (%): 90 Cervical Position: Middle Station: Plus 1, 0 Presentation: Vertex Exam by:: Autry-Lott MD FHT: baseline rate 125, moderate varibility, 15 X15 acel, late decels present Toco: 1-2 mins  Labs: Lab Results  Component Value Date   WBC 9.8 10/24/2018   HGB 10.9 (L) 10/24/2018   HCT 33.3 (L) 10/24/2018   MCV 83.9 10/24/2018   PLT 270 10/24/2018    Patient Active Problem List   Diagnosis Date Noted  . PROM (premature rupture of membranes) 10/24/2018  . Endometriosis 05/12/2018  . Marijuana use 05/10/2018  . Depression/anxiety/PTSD 05/04/2018  . Smoker 05/04/2018  . Supervision of normal first pregnancy 04/30/2018  . Benzodiazepine dependence ([place]) 10/07/2012  . Anovulatory (dysfunctional uterine) bleeding 08/23/2012  . Costochondral chest pain 02/10/2012  . Dysuria 01/14/2012    Assessment / Plan: 33 y.o. G1P0 at [redacted]w[redacted]d here forSOL with SROM with meconium.   Labor: Cervical improvement. Pitocin started. Fetal Wellbeing:  Cat II Pain Control:  Epidural placed Anticipated MOD:  Vaginal  Pamela Gallagher, D.O. Family Medicine Resident, PGY-1  10/25/2018, 9:42 AM

## 2018-10-26 LAB — CBC
HCT: 23.9 % — ABNORMAL LOW (ref 36.0–46.0)
Hemoglobin: 7.7 g/dL — ABNORMAL LOW (ref 12.0–15.0)
MCH: 27.1 pg (ref 26.0–34.0)
MCHC: 32.2 g/dL (ref 30.0–36.0)
MCV: 84.2 fL (ref 80.0–100.0)
Platelets: 209 10*3/uL (ref 150–400)
RBC: 2.84 MIL/uL — ABNORMAL LOW (ref 3.87–5.11)
RDW: 13.7 % (ref 11.5–15.5)
WBC: 20.4 10*3/uL — ABNORMAL HIGH (ref 4.0–10.5)
nRBC: 0 % (ref 0.0–0.2)

## 2018-10-26 MED ORDER — FERROUS FUMARATE 324 (106 FE) MG PO TABS
1.0000 | ORAL_TABLET | Freq: Two times a day (BID) | ORAL | Status: DC
  Administered 2018-10-26 – 2018-10-27 (×3): 106 mg via ORAL
  Filled 2018-10-26 (×3): qty 1

## 2018-10-26 NOTE — Anesthesia Postprocedure Evaluation (Signed)
Anesthesia Post Note  Patient: Pamela Gallagher  Procedure(s) Performed: AN AD HOC LABOR EPIDURAL     Patient location during evaluation: Mother Baby Anesthesia Type: Epidural Level of consciousness: awake and alert Pain management: pain level controlled Vital Signs Assessment: post-procedure vital signs reviewed and stable Respiratory status: spontaneous breathing, nonlabored ventilation and respiratory function stable Cardiovascular status: stable Postop Assessment: no headache, no backache, epidural receding, no apparent nausea or vomiting, patient able to bend at knees, adequate PO intake and able to ambulate Anesthetic complications: no    Last Vitals:  Vitals:   10/26/18 0330 10/26/18 0634  BP: 102/71 109/67  Pulse: (!) 108 98  Resp:  18  Temp: 37 C 36.9 C  SpO2:      Last Pain:  Vitals:   10/26/18 0634  TempSrc: Oral  PainSc:    Pain Goal: Patients Stated Pain Goal: 2 (10/25/18 0504)                 Jabier Mutton

## 2018-10-26 NOTE — Progress Notes (Signed)
Post Partum Day #1 from [facility] Subjective: no complaints, up ad lib and tolerating PO; breastfeeding going well; plans on POPs for contraception; denies dizziness  Objective: Blood pressure 115/75, pulse 90, temperature 97.8 F (36.6 C), resp. rate 20, height 5\' 3"  (1.6 m), weight 71.4 kg, last menstrual period 10/17/2017, SpO2 100 %, unknown if currently breastfeeding.  Physical Exam:  General: alert, cooperative and no distress Lochia: appropriate Uterine Fundus: firm DVT Evaluation: No evidence of DVT seen on physical exam.  Recent Labs    10/24/18 2215 10/26/18 0539  HGB 10.9* 7.7*  HCT 33.3* 23.9*    Assessment/Plan: Anemia- po iron ordered Plan for discharge tomorrow  SW consult ordered for [place] hx   LOS: 2 days   Myrtis Ser CNM 10/26/2018, 9:12 AM

## 2018-10-26 NOTE — Clinical Social Work Maternal (Signed)
CLINICAL SOCIAL WORK MATERNAL/CHILD NOTE  Patient Details  Name: Pamela Gallagher MRN: [id number] Date of Birth: 09/11/1985  Date:  07-12-2018  Clinical Social Worker Initiating Note:  Elijio Miles Date/Time: Initiated:  10/26/18/0904     Child's Name:  Pamela Gallagher   Biological Parents:  Mother, Father(Pamela Gallagher and Pamela Gallagher DOB: 05/03/1996)   Need for Interpreter:  None   Reason for Referral:  Behavioral Health Concerns, Current Substance Use/Substance Use During Pregnancy    Address:  [place] [address]    Phone number:  [phone] (home)     Additional phone number:   Household Members/Support Persons (HM/SP):   Household Member/Support Person 1   HM/SP Name Relationship DOB or Age  HM/SP -1 Pamela Gallagher FOB 05/03/1996  HM/SP -2        HM/SP -3        HM/SP -4        HM/SP -5        HM/SP -6        HM/SP -7        HM/SP -8          Natural Supports (not living in the home):  Extended Family, Immediate Family, Parent   Professional Supports: None   Employment: Full-time   Type of Work: [employer]   Education:  [place] arranged:    Museum/gallery curator Resources:  Multimedia programmer   Other Resources:      Cultural/Religious Considerations Which May Impact Care:     Strengths:  Ability to meet basic needs , Home prepared for child , Engineer, materials, Psychotropic Medications   Psychotropic Medications:  Zoloft      Pediatrician:    [place] (including [place])  Pediatrician List:   [facility]    Pediatrician Fax Number:    Risk Factors/Current Problems:      Cognitive State:  Able to Concentrate , Alert , Goal Oriented , Insightful    Mood/Affect:  Bright , Calm , Comfortable , Happy , Interested , Relaxed    CSW Assessment:  CSW received  consult for history of depression, anxiety, PTSD and THC use.  CSW met with MOB to offer support and complete assessment.    MOB resting in bed with infant asleep in basinet, when CSW entered the room. FOB also present but was on his way to [facility] so was not present during assessment. CSW introduced self and explained reason for consult to which MOB expressed understanding. MOB reported she currently lives with FOB and works full-time at [employer]. MOB very pleasant and easy to engage throughout assessment. MOB appeared to be in good-spirits and appropriately bonded to infant. CSW inquired about MOB's mental health history and MOB acknowledged having a history of severe depression and anxiety because of things in her childhood. MOB denied any recent mental health symptoms and denied any symptoms during pregnancy. MOB reported she is currently on Zoloft and feels it is effective in managing her symptoms. CSW provided education regarding the baby blues period vs. perinatal mood disorders, discussed treatment and gave resources for mental health follow up if concerns arise.  CSW recommends self-evaluation during the postpartum time period using the New Mom Checklist from Postpartum Progress and encouraged MOB to contact a medical professional if symptoms are noted  at any time. MOB denied any current SI, HI or DV and reported having a good support system consisting of FOB, her mother, her aunt and FOB's mother.   CSW inquired about MOB's substance use history and MOB acknowledged using THC during pregnancy to help alleviate her nausea. MOB reported she spoke with her doctor who was able to prescribe her medications and that she quit THC use after that. MOB reported last use was during her first trimester. CSW informed MOB of Hospital Drug Policy and explained UDS came back negative but that CDS was still pending and that a CPS report would be made, if warranted. MOB denied any questions or  concerns regarding policy.  MOB confirmed having all essential items for infant once discharged and reported infant would be sleeping in a basinet once home. CSW provided review of Sudden Infant Death Syndrome (SIDS) precautions. MOB denied any further questions, concerns or need for resources from CSW at this time.    CSW Plan/Description:  No Further Intervention Required/No Barriers to Discharge, Sudden Infant Death Syndrome (SIDS) Education, Perinatal Mood and Anxiety Disorder (PMADs) Education, Hospital Drug Screen Policy Information, CSW Will Continue to Monitor Umbilical Cord Tissue Drug Screen Results and Make Report if Warranted    Elzia Hott, LCSWA 10/26/2018, 9:34 AM 

## 2018-10-26 NOTE — Lactation Note (Addendum)
This note was copied from a baby's chart. Lactation Consultation Note  Patient Name: Pamela Gallagher MBTDH'R Date: 10/26/2018   Mom called out for assist b/c "Pamela Gallagher" was feeding for a long period of time. Pamela Gallagher is 10 hrs old & is feeding well, with spontaneous swallows of good frequency (swallows verified by cervical auscultation). Pamela Gallagher appears to be cluster-feeding after having had a long nap. I reassured Mom.  Mom reports that her breasts already feel heavier and she feels that her R breast has gotten larger just over the last 1.5 hr that "Pamela Gallagher" has been at the breast. Mom will likely have an abundant supply once her milk has come to volume.   I noted that Mom's R nipple & areola have a compression stripe. Mom says that was before she changed her latch technique. When "Pamela Gallagher" released her latch on the L breast, that nipple was not compressed.   Mom has a Medela pump at home. She will likely need size 21 flanges, which I provided.   I also provided shells to protect nipples from fabric rubbing and to allow coconut oil to absorb.   Mom takes Zoloft 25mg  (L2). Her chart also mentions Vistaril 25mg  (L2).    Matthias Hughs [facility] 10/26/2018, 1:51 PM

## 2018-10-26 NOTE — Lactation Note (Signed)
This note was copied from a baby's chart. Lactation Consultation Note  Patient Name: Pamela Gallagher Today's Date: 10/26/2018 Reason for consult: Initial assessment;1st time breastfeeding;Term P1. 53 hour female infant. Infant had 2 voids and 3 stools since birth. Dad changed void and stool while LC was in room. Mom feels breastfeeding is getting better at first infant did not have deep latch and was on the tip of her breast, mom has been using coconut oil for breast soreness. Mom latched infant on left breast using cross cradle hold, infant mouth was wide, LC asked mom to support breast with U or C hold, few swallows observed, infant breastfeed for 38 minutes. Per mom she only felt tug but no pain, mom 's nipple was well rounded when infant unlatched from breast. Mom taught back hand expression and infant was given 3 ml of colostrum by spoon. Mom know to breastfeed according hunger cues, 8 to 12 times within 24 hours and on demand. Parents will continue to do as much STS as possible. Mom knows to call Nurse or [place] if she has any further questions, concerns or need assistance with latching infant to breast. LC discussed I & O. Reviewed Baby & Me book's Breastfeeding Basics.  Mom made aware of O/P services, breastfeeding support groups, community resources, and our phone # for post-discharge questions.   Maternal Data Formula Feeding for Exclusion: No Has patient been taught Hand Expression?: Yes(infant was given 3 ml of colostrum by spoon.) Does the patient have breastfeeding experience prior to this delivery?: No  Feeding Feeding Type: Breast Fed  LATCH Score Latch: Grasps breast easily, tongue down, lips flanged, rhythmical sucking.  Audible Swallowing: A few with stimulation  Type of Nipple: Everted at rest and after stimulation  Comfort (Breast/Nipple): Soft / non-tender  Hold (Positioning): Assistance needed to correctly position infant at breast and maintain latch.  LATCH  Score: 8  Interventions Interventions: Breast feeding basics reviewed;Assisted with latch;Skin to skin;Breast massage;Position options;Support pillows;Adjust position;Breast compression;Hand express;Expressed milk;Coconut oil  Lactation Tools Discussed/Used WIC Program: No   Consult Status Consult Status: Follow-up Date: 10/26/18 Follow-up type: In-patient    Vicente Serene 10/26/2018, 4:28 AM

## 2018-10-27 MED ORDER — IBUPROFEN 600 MG PO TABS: 600.0000 mg | ORAL_TABLET | Freq: Four times a day (QID) | ORAL | 0 refills | Status: DC

## 2018-10-27 MED ORDER — NORETHINDRONE 0.35 MG PO TABS: 1.0000 | ORAL_TABLET | Freq: Every day | ORAL | 11 refills | Status: DC

## 2018-10-27 NOTE — Discharge Instructions (Signed)
Breastfeeding    Choosing to breastfeed is one of the best decisions you can make for yourself and your baby. A change in hormones during pregnancy causes your breasts to make breast milk in your milk-producing glands. Hormones prevent breast milk from being released before your baby is born. They also prompt milk flow after birth. Once breastfeeding has begun, thoughts of your baby, as well as his or her sucking or crying, can stimulate the release of milk from your milk-producing glands.  Benefits of breastfeeding  Research shows that breastfeeding offers many health benefits for infants and mothers. It also offers a cost-free and convenient way to feed your baby.  For your baby  Your first milk (colostrum) helps your baby's digestive system to function better.  Special cells in your milk (antibodies) help your baby to fight off infections.  Breastfed babies are less likely to develop asthma, allergies, obesity, or type 2 diabetes. They are also at lower risk for sudden infant death syndrome (SIDS).  Nutrients in breast milk are better able to meet your baby’s needs compared to infant formula.  Breast milk improves your baby's brain development.  For you  Breastfeeding helps to create a very special bond between you and your baby.  Breastfeeding is convenient. Breast milk costs nothing and is always available at the correct temperature.  Breastfeeding helps to burn calories. It helps you to lose the weight that you gained during pregnancy.  Breastfeeding makes your uterus return faster to its size before pregnancy. It also slows bleeding (lochia) after you give birth.  Breastfeeding helps to lower your risk of developing type 2 diabetes, osteoporosis, rheumatoid arthritis, cardiovascular disease, and breast, ovarian, uterine, and endometrial cancer later in life.  Breastfeeding basics  Starting breastfeeding  Find a comfortable place to sit or lie down, with your neck and back well-supported.  Place a pillow or a  rolled-up blanket under your baby to bring him or her to the level of your breast (if you are seated). Nursing pillows are specially designed to help support your arms and your baby while you breastfeed.  Make sure that your baby's tummy (abdomen) is facing your abdomen.  Gently massage your breast. With your fingertips, massage from the outer edges of your breast inward toward the nipple. This encourages milk flow. If your milk flows slowly, you may need to continue this action during the feeding.  Support your breast with 4 fingers underneath and your thumb above your nipple (make the letter "C" with your hand). Make sure your fingers are well away from your nipple and your baby’s mouth.  Stroke your baby's lips gently with your finger or nipple.  When your baby's mouth is open wide enough, quickly bring your baby to your breast, placing your entire nipple and as much of the areola as possible into your baby's mouth. The areola is the colored area around your nipple.  More areola should be visible above your baby's upper lip than below the lower lip.  Your baby's lips should be opened and extended outward (flanged) to ensure an adequate, comfortable latch.  Your baby's tongue should be between his or her lower gum and your breast.  Make sure that your baby's mouth is correctly positioned around your nipple (latched). Your baby's lips should create a seal on your breast and be turned out (everted).  It is common for your baby to suck about 2-3 minutes in order to start the flow of breast milk.  Latching  Teaching   your baby how to latch onto your breast properly is very important. An improper latch can cause nipple pain, decreased milk supply, and poor weight gain in your baby. Also, if your baby is not latched onto your nipple properly, he or she may swallow some air during feeding. This can make your baby fussy. Burping your baby when you switch breasts during the feeding can help to get rid of the air. However,  teaching your baby to latch on properly is still the best way to prevent fussiness from swallowing air while breastfeeding.  Signs that your baby has successfully latched onto your nipple  Silent tugging or silent sucking, without causing you pain. Infant's lips should be extended outward (flanged).  Swallowing heard between every 3-4 sucks once your milk has started to flow (after your let-down milk reflex occurs).  Muscle movement above and in front of his or her ears while sucking.  Signs that your baby has not successfully latched onto your nipple  Sucking sounds or smacking sounds from your baby while breastfeeding.  Nipple pain.  If you think your baby has not latched on correctly, slip your finger into the corner of your baby’s mouth to break the suction and place it between your baby's gums. Attempt to start breastfeeding again.  Signs of successful breastfeeding  Signs from your baby  Your baby will gradually decrease the number of sucks or will completely stop sucking.  Your baby will fall asleep.  Your baby's body will relax.  Your baby will retain a small amount of milk in his or her mouth.  Your baby will let go of your breast by himself or herself.  Signs from you  Breasts that have increased in firmness, weight, and size 1-3 hours after feeding.  Breasts that are softer immediately after breastfeeding.  Increased milk volume, as well as a change in milk consistency and color by the fifth day of breastfeeding.  Nipples that are not sore, cracked, or bleeding.  Signs that your baby is getting enough milk  Wetting at least 1-2 diapers during the first 24 hours after birth.  Wetting at least 5-6 diapers every 24 hours for the first week after birth. The urine should be clear or pale yellow by the age of 5 days.  Wetting 6-8 diapers every 24 hours as your baby continues to grow and develop.  At least 3 stools in a 24-hour period by the age of 5 days. The stool should be soft and yellow.  At least 3 stools  in a 24-hour period by the age of 7 days. The stool should be seedy and yellow.  No loss of weight greater than 10% of birth weight during the first 3 days of life.  Average weight gain of 4-7 oz (113-198 g) per week after the age of 4 days.  Consistent daily weight gain by the age of 5 days, without weight loss after the age of 2 weeks.  After a feeding, your baby may spit up a small amount of milk. This is normal.  Breastfeeding frequency and duration  Frequent feeding will help you make more milk and can prevent sore nipples and extremely full breasts (breast engorgement). Breastfeed when you feel the need to reduce the fullness of your breasts or when your baby shows signs of hunger. This is called "breastfeeding on demand." Signs that your baby is hungry include:  Increased alertness, activity, or restlessness.  Movement of the head from side to side.  Opening of   the mouth when the corner of the mouth or cheek is stroked (rooting).  Increased sucking sounds, smacking lips, cooing, sighing, or squeaking.  Hand-to-mouth movements and sucking on fingers or hands.  Fussing or crying.  Avoid introducing a pacifier to your baby in the first 4-6 weeks after your baby is born. After this time, you may choose to use a pacifier. Research has shown that pacifier use during the first year of a baby's life decreases the risk of sudden infant death syndrome (SIDS).  Allow your baby to feed on each breast as long as he or she wants. When your baby unlatches or falls asleep while feeding from the first breast, offer the second breast. Because newborns are often sleepy in the first few weeks of life, you may need to awaken your baby to get him or her to feed.  Breastfeeding times will vary from baby to baby. However, the following rules can serve as a guide to help you make sure that your baby is properly fed:  Newborns (babies 4 weeks of age or younger) may breastfeed every 1-3 hours.  Newborns should not go without  breastfeeding for longer than 3 hours during the day or 5 hours during the night.  You should breastfeed your baby a minimum of 8 times in a 24-hour period.  Breast milk pumping         Pumping and storing breast milk allows you to make sure that your baby is exclusively fed your breast milk, even at times when you are unable to breastfeed. This is especially important if you go back to work while you are still breastfeeding, or if you are not able to be present during feedings. Your lactation consultant can help you find a method of pumping that works best for you and give you guidelines about how long it is safe to store breast milk.  Caring for your breasts while you breastfeed  Nipples can become dry, cracked, and sore while breastfeeding. The following recommendations can help keep your breasts moisturized and healthy:  Avoid using soap on your nipples.  Wear a supportive bra designed especially for nursing. Avoid wearing underwire-style bras or extremely tight bras (sports bras).  Air-dry your nipples for 3-4 minutes after each feeding.  Use only cotton bra pads to absorb leaked breast milk. Leaking of breast milk between feedings is normal.  Use lanolin on your nipples after breastfeeding. Lanolin helps to maintain your skin's normal moisture barrier. Pure lanolin is not harmful (not toxic) to your baby. You may also hand express a few drops of breast milk and gently massage that milk into your nipples and allow the milk to air-dry.  In the first few weeks after giving birth, some women experience breast engorgement. Engorgement can make your breasts feel heavy, warm, and tender to the touch. Engorgement peaks within 3-5 days after you give birth. The following recommendations can help to ease engorgement:  Completely empty your breasts while breastfeeding or pumping. You may want to start by applying warm, moist heat (in the shower or with warm, water-soaked hand towels) just before feeding or pumping. This  increases circulation and helps the milk flow. If your baby does not completely empty your breasts while breastfeeding, pump any extra milk after he or she is finished.  Apply ice packs to your breasts immediately after breastfeeding or pumping, unless this is too uncomfortable for you. To do this:  Put ice in a plastic bag.  Place a towel between your   skin and the bag.  Leave the ice on for 20 minutes, 2-3 times a day.  Make sure that your baby is latched on and positioned properly while breastfeeding.  If engorgement persists after 48 hours of following these recommendations, contact your health care provider or a lactation consultant.  Overall health care recommendations while breastfeeding  Eat 3 healthy meals and 3 snacks every day. Well-nourished mothers who are breastfeeding need an additional 450-500 calories a day. You can meet this requirement by increasing the amount of a balanced diet that you eat.  Drink enough water to keep your urine pale yellow or clear.  Rest often, relax, and continue to take your prenatal vitamins to prevent fatigue, stress, and low vitamin and mineral levels in your body (nutrient deficiencies).  Do not use any products that contain nicotine or tobacco, such as cigarettes and e-cigarettes. Your baby may be harmed by chemicals from cigarettes that pass into breast milk and exposure to secondhand smoke. If you need help quitting, ask your health care provider.  Avoid alcohol.  Do not use illegal drugs or marijuana.  Talk with your health care provider before taking any medicines. These include over-the-counter and prescription medicines as well as vitamins and herbal supplements. Some medicines that may be harmful to your baby can pass through breast milk.  It is possible to become pregnant while breastfeeding. If birth control is desired, ask your health care provider about options that will be safe while breastfeeding your baby.  Where to find more information:  [employer]: www.llli.org  Contact a health care provider if:  You feel like you want to stop breastfeeding or have become frustrated with breastfeeding.  Your nipples are cracked or bleeding.  Your breasts are red, tender, or warm.  You have:  Painful breasts or nipples.  A swollen area on either breast.  A fever or chills.  Nausea or vomiting.  Drainage other than breast milk from your nipples.  Your breasts do not become full before feedings by the fifth day after you give birth.  You feel sad and depressed.  Your baby is:  Too sleepy to eat well.  Having trouble sleeping.  More than 1 week old and wetting fewer than 6 diapers in a 24-hour period.  Not gaining weight by 5 days of age.  Your baby has fewer than 3 stools in a 24-hour period.  Your baby's skin or the white parts of his or her eyes become yellow.  Get help right away if:  Your baby is overly tired (lethargic) and does not want to wake up and feed.  Your baby develops an unexplained fever.  Summary  Breastfeeding offers many health benefits for infant and mothers.  Try to breastfeed your infant when he or she shows early signs of hunger.  Gently tickle or stroke your baby's lips with your finger or nipple to allow the baby to open his or her mouth. Bring the baby to your breast. Make sure that much of the areola is in your baby's mouth. Offer one side and burp the baby before you offer the other side.  Talk with your health care provider or lactation consultant if you have questions or you face problems as you breastfeed.  This information is not intended to replace advice given to you by your health care provider. Make sure you discuss any questions you have with your health care provider.  Document Released: 04/07/2005 Document Revised: 07/02/2017 Document Reviewed: 05/09/2016    Elsevier Patient Education © 2020 [employer]  Postpartum Care After Vaginal Delivery  This sheet gives you information about how to care for yourself from the time you  deliver your baby to up to 6-12 weeks after delivery (postpartum period). Your health care provider may also give you more specific instructions. If you have problems or questions, contact your health care provider.  Follow these instructions at home:  Vaginal bleeding  · It is normal to have vaginal bleeding (lochia) after delivery. Wear a sanitary pad for vaginal bleeding and discharge.  ? During the first week after delivery, the amount and appearance of lochia is often similar to a menstrual period.  ? Over the next few weeks, it will gradually decrease to a dry, yellow-brown discharge.  ? For most women, lochia stops completely by 4-6 weeks after delivery. Vaginal bleeding can vary from woman to woman.  · Change your sanitary pads frequently. Watch for any changes in your flow, such as:  ? A sudden increase in volume.  ? A change in color.  ? Large blood clots.  · If you pass a blood clot from your vagina, save it and call your health care provider to discuss. Do not flush blood clots down the toilet before talking with your health care provider.  · Do not use tampons or douches until your health care provider says this is safe.  · If you are not breastfeeding, your period should return 6-8 weeks after delivery. If you are feeding your child breast milk only (exclusive breastfeeding), your period may not return until you stop breastfeeding.  Perineal care  · Keep the area between the vagina and the anus (perineum) clean and dry as told by your health care provider. Use medicated pads and pain-relieving sprays and creams as directed.  · If you had a cut in the perineum (episiotomy) or a tear in the vagina, check the area for signs of infection until you are healed. Check for:  ? More redness, swelling, or pain.  ? Fluid or blood coming from the cut or tear.  ? Warmth.  ? Pus or a bad smell.  · You may be given a squirt bottle to use instead of wiping to clean the perineum area after you go to the bathroom. As  you start healing, you may use the squirt bottle before wiping yourself. Make sure to wipe gently.  · To relieve pain caused by an episiotomy, a tear in the vagina, or swollen veins in the anus (hemorrhoids), try taking a warm sitz bath 2-3 times a day. A sitz bath is a warm water bath that is taken while you are sitting down. The water should only come up to your hips and should cover your buttocks.  Breast care  · Within the first few days after delivery, your breasts may feel heavy, full, and uncomfortable (breast engorgement). Milk may also leak from your breasts. Your health care provider can suggest ways to help relieve the discomfort. Breast engorgement should go away within a few days.  · If you are breastfeeding:  ? Wear a bra that supports your breasts and fits you well.  ? Keep your nipples clean and dry. Apply creams and ointments as told by your health care provider.  ? You may need to use breast pads to absorb milk that leaks from your breasts.  ? You may have uterine contractions every time you breastfeed for up to several weeks after delivery. Uterine contractions help your uterus   return to its normal size.  ? If you have any problems with breastfeeding, work with your health care provider or lactation consultant.  · If you are not breastfeeding:  ? Avoid touching your breasts a lot. Doing this can make your breasts produce more milk.  ? Wear a good-fitting bra and use cold packs to help with swelling.  ? Do not squeeze out (express) milk. This causes you to make more milk.  Intimacy and sexuality  · Ask your health care provider when you can engage in sexual activity. This may depend on:  ? Your risk of infection.  ? How fast you are healing.  ? Your comfort and desire to engage in sexual activity.  · You are able to get pregnant after delivery, even if you have not had your period. If desired, talk with your health care provider about methods of birth control (contraception).  Medicines  · Take  over-the-counter and prescription medicines only as told by your health care provider.  · If you were prescribed an antibiotic medicine, take it as told by your health care provider. Do not stop taking the antibiotic even if you start to feel better.  Activity  · Gradually return to your normal activities as told by your health care provider. Ask your health care provider what activities are safe for you.  · Rest as much as possible. Try to rest or take a nap while your baby is sleeping.  Eating and drinking    · Drink enough fluid to keep your urine pale yellow.  · Eat high-fiber foods every day. These may help prevent or relieve constipation. High-fiber foods include:  ? Whole grain cereals and breads.  ? Brown rice.  ? Beans.  ? Fresh fruits and vegetables.  · Do not try to lose weight quickly by cutting back on calories.  · Take your prenatal vitamins until your postpartum checkup or until your health care provider tells you it is okay to stop.  Lifestyle  · Do not use any products that contain nicotine or tobacco, such as cigarettes and e-cigarettes. If you need help quitting, ask your health care provider.  · Do not drink alcohol, especially if you are breastfeeding.  General instructions  · Keep all follow-up visits for you and your baby as told by your health care provider. Most women visit their health care provider for a postpartum checkup within the first 3-6 weeks after delivery.  Contact a health care provider if:  · You feel unable to cope with the changes that your child brings to your life, and these feelings do not go away.  · You feel unusually sad or worried.  · Your breasts become red, painful, or hard.  · You have a fever.  · You have trouble holding urine or keeping urine from leaking.  · You have little or no interest in activities you used to enjoy.  · You have not breastfed at all and you have not had a menstrual period for 12 weeks after delivery.  · You have stopped breastfeeding and you  have not had a menstrual period for 12 weeks after you stopped breastfeeding.  · You have questions about caring for yourself or your baby.  · You pass a blood clot from your vagina.  Get help right away if:  · You have chest pain.  · You have difficulty breathing.  · You have sudden, severe leg pain.  · You have severe pain or cramping in   your lower abdomen.  · You bleed from your vagina so much that you fill more than one sanitary pad in one hour. Bleeding should not be heavier than your heaviest period.  · You develop a severe headache.  · You faint.  · You have blurred vision or spots in your vision.  · You have bad-smelling vaginal discharge.  · You have thoughts about hurting yourself or your baby.  If you ever feel like you may hurt yourself or others, or have thoughts about taking your own life, get help right away. You can go to the nearest emergency department or call:  · Your local emergency services (911 in the [place]  · A suicide crisis helpline, such as the National Suicide Prevention Lifeline at 1-800-273-8255. This is open 24 hours a day.  Summary  · The period of time right after you deliver your newborn up to 6-12 weeks after delivery is called the postpartum period.  · Gradually return to your normal activities as told by your health care provider.  · Keep all follow-up visits for you and your baby as told by your health care provider.  This information is not intended to replace advice given to you by your health care provider. Make sure you discuss any questions you have with your health care provider.  Document Released: 02/02/2007 Document Revised: 04/10/2017 Document Reviewed: 01/19/2017  Elsevier Patient Education © 2020 [employer]

## 2018-10-27 NOTE — Lactation Note (Signed)
This note was copied from a baby's chart. Lactation Consultation Note  Patient Name: Pamela Gallagher [id number] Date: 10/27/2018 Reason for consult: Follow-up assessment Baby is 43 hours old/6% weight loss.  Mom reports that baby is latching well.  She is feeding frequently.  Discussed milk coming to volume and the prevention and treatment of engorgement.  Mom has a breast pump at home.  Questions answered.  Reviewed lactation outpatient services and encouraged to call  Prn.  Maternal Data    Feeding    LATCH Score                   Interventions    Lactation Tools Discussed/Used     Consult Status Consult Status: Complete Follow-up type: Call as needed    Ave Filter 10/27/2018, 9:25 AM

## 2018-10-28 ENCOUNTER — Encounter: Payer: BC Managed Care – PPO | Admitting: Advanced Practice Midwife

## 2018-10-28 ENCOUNTER — Other Ambulatory Visit: Payer: BC Managed Care – PPO

## 2018-11-03 ENCOUNTER — Other Ambulatory Visit: Payer: Self-pay | Admitting: Advanced Practice Midwife

## 2018-11-09 ENCOUNTER — Other Ambulatory Visit: Payer: Self-pay | Admitting: Women's Health

## 2018-11-09 MED ORDER — BUTALBITAL-APAP-CAFFEINE 50-325-40 MG PO TABS: 1.0000 | ORAL_TABLET | ORAL | 0 refills | Status: DC | PRN

## 2018-11-30 ENCOUNTER — Ambulatory Visit: Payer: BC Managed Care – PPO | Admitting: Advanced Practice Midwife

## 2018-11-30 ENCOUNTER — Ambulatory Visit (INDEPENDENT_AMBULATORY_CARE_PROVIDER_SITE_OTHER): Payer: BC Managed Care – PPO | Admitting: Advanced Practice Midwife

## 2018-11-30 ENCOUNTER — Other Ambulatory Visit: Payer: Self-pay

## 2018-11-30 ENCOUNTER — Encounter: Payer: Self-pay | Admitting: Advanced Practice Midwife

## 2018-11-30 ENCOUNTER — Telehealth: Payer: Self-pay | Admitting: Radiology

## 2018-11-30 DIAGNOSIS — G44219 Episodic tension-type headache, not intractable: Secondary | ICD-10-CM

## 2018-11-30 DIAGNOSIS — G44211 Episodic tension-type headache, intractable: Secondary | ICD-10-CM

## 2018-11-30 MED ORDER — BUTALBITAL-APAP-CAFFEINE 50-325-40 MG PO TABS: 1.0000 | ORAL_TABLET | ORAL | 0 refills | Status: DC | PRN

## 2018-11-30 MED ORDER — LIDOCAINE 5 % EX OINT: 1.0000 "application " | TOPICAL_OINTMENT | CUTANEOUS | 0 refills | Status: DC | PRN

## 2018-11-30 MED ORDER — NORGESTIMATE-ETH ESTRADIOL 0.25-35 MG-MCG PO TABS: 1.0000 | ORAL_TABLET | Freq: Every day | ORAL | 11 refills | Status: DC

## 2018-11-30 MED ORDER — SERTRALINE HCL 50 MG PO TABS: 50.0000 mg | ORAL_TABLET | Freq: Every day | ORAL | 6 refills | Status: DC

## 2018-11-30 NOTE — Telephone Encounter (Signed)
Left message for patient to call [id number] to schedule New Headache appt with Allie Dimmer

## 2018-11-30 NOTE — Progress Notes (Signed)
Pamela Gallagher is a 33 y.o. who presents for a postpartum visit. She is 5 weeks postpartum following a VAD (variables). I have fully reviewed the prenatal and intrapartum course. The delivery was at 39+ gestational weeks.  Anesthesia: epidural. Postpartum course has been complicated by: 1.  Daily HAs across forehead since delivery. No hx of HAs, BP normal, no vision changes or other sx.  firorcet helps some, but has to take daily.  2.  Sciatica:  Has seen chiropractor in past, will revisit 3. "Knives sticking in my clit" feeling, daily.   4.  Some sadness, on zoloft, declines referral.     . Baby's course has been uneventful. Baby is feeding by bottle. Bleeding: no bleeding. Bowel function is normal. Bladder function is normal. Patient is not sexually active. Contraception method is abstinence. Postpartum depression screening: 12 .   Current Outpatient Medications:  .  butalbital-acetaminophen-caffeine (FIORICET) 50-325-40 MG tablet, Take 1 tablet by mouth every 4 (four) hours as needed for headache or migraine., Disp: 20 tablet, Rfl: 0 .  omeprazole (PRILOSEC) 20 MG capsule, TAKE 1 CAPSULE BY MOUTH EVERY DAY, Disp: 30 capsule, Rfl: 3 .  sertraline (ZOLOFT) 50 MG tablet, Take 1 tablet (50 mg total) by mouth daily., Disp: 30 tablet, Rfl: 6 .  albuterol (PROVENTIL HFA;VENTOLIN HFA) 108 (90 BASE) MCG/ACT inhaler, Inhale 2 puffs into the lungs every 6 (six) hours as needed. Wheezing (Patient not taking: Reported on 09/16/2018), Disp: , Rfl:  .  cholecalciferol (VITAMIN D3) 25 MCG (1000 UT) tablet, Take 4,000 Units by mouth daily., Disp: , Rfl:  .  cyclobenzaprine (FLEXERIL) 10 MG tablet, Take 1 tablet (10 mg total) by mouth every 8 (eight) hours as needed for muscle spasms. (Patient not taking: Reported on 11/30/2018), Disp: 30 tablet, Rfl: 1 .  hydrOXYzine (ATARAX/VISTARIL) 25 MG tablet, Take 1 tablet (25 mg total) by mouth 3 (three) times daily as needed for anxiety. (Patient not taking: Reported on  11/30/2018), Disp: 30 tablet, Rfl: 0 .  ibuprofen (ADVIL) 600 MG tablet, Take 1 tablet (600 mg total) by mouth every 6 (six) hours. (Patient not taking: Reported on 11/30/2018), Disp: 30 tablet, Rfl: 0 .  ipratropium-albuterol (DUONEB) 0.5-2.5 (3) MG/3ML SOLN, Inhale 3 mLs into the lungs every 4 (four) hours as needed (FOR WHEEZING AND SHORTNESS OF BREATH). , Disp: , Rfl:  .  lidocaine (XYLOCAINE) 5 % ointment, Apply 1 application topically as needed., Disp: 35.44 g, Rfl: 0 .  norethindrone (MICRONOR) 0.35 MG tablet, Take 1 tablet (0.35 mg total) by mouth daily. (Patient not taking: Reported on 11/30/2018), Disp: 1 Package, Rfl: 11 .  norgestimate-ethinyl estradiol (ORTHO-CYCLEN) 0.25-35 MG-MCG tablet, Take 1 tablet by mouth daily., Disp: 1 Package, Rfl: 11 .  Prenatal Vit-Fe Fumarate-FA (PRENATAL VITAMIN PO), Take by mouth., Disp: , Rfl:   Review of Systems   Constitutional: Negative for fever and chills Eyes: Negative for visual disturbances Respiratory: Negative for shortness of breath, dyspnea Cardiovascular: Negative for chest pain or palpitations  Gastrointestinal: Negative for vomiting, diarrhea and constipation Genitourinary: Negative for dysuria and urgency Musculoskeletal: Negative for joint pain, myalgias  Neurological: Negative for dizziness    Objective:     Vitals:   11/30/18 1404  BP: 118/75  Pulse: 96   General:  alert, cooperative and no distress   Breasts:  negative  Lungs: Normal respiratory effort  Heart:  regular rate and rhythm  Abdomen: Soft, nontender   Vulva:  normal  Vagina: normal vagina.  Well healed  perineal lac, nothing evident w/clitoris, looks normal  Cervix:  closed  Corpus: Well involuted     Rectal Exam: no hemorrhoids        Assessment:    normal postpartum exam. HAs Clitoral nerve pain Plan:   1. Start Sprintec 2. Daily HAs across forehead since delivery. No hx of HAs, BP normal, no vision changes or other sx.  firorcet helps some, but  has to take daily. Head CT ordered, [facility]; refilled fioricet 2.  Sciatica:  Has seen chiropractor in past, will revisit 3. "Knives sticking in my clit" feeling, daily. rx lidocaine jelly to use prn, should abate w/time  4.  Some sadness, on zoloft, declines referral.  Increase zoloft to 50mg

## 2018-12-01 ENCOUNTER — Telehealth: Payer: Self-pay | Admitting: *Deleted

## 2018-12-01 ENCOUNTER — Ambulatory Visit (HOSPITAL_COMMUNITY)
Admission: RE | Admit: 2018-12-01 | Discharge: 2018-12-01 | Disposition: A | Discharge status: Home / Self Care | Payer: BC Managed Care – PPO | Source: Ambulatory Visit | Attending: Advanced Practice Midwife | Admitting: Advanced Practice Midwife

## 2018-12-01 ENCOUNTER — Telehealth: Payer: Self-pay | Admitting: Advanced Practice Midwife

## 2018-12-01 DIAGNOSIS — G44219 Episodic tension-type headache, not intractable: Secondary | ICD-10-CM | POA: Insufficient documentation

## 2018-12-01 DIAGNOSIS — R51 Headache: Secondary | ICD-10-CM | POA: Diagnosis not present

## 2018-12-01 NOTE — Telephone Encounter (Signed)
Patient notified stat ct has been ordered.  Pt to go now. Verbalized understanding.

## 2018-12-01 NOTE — Addendum Note (Signed)
Addended by: Christin Fudge on: 12/01/2018 10:32 AM   Modules accepted: Orders

## 2018-12-01 NOTE — Telephone Encounter (Signed)
Patient called, stated she missed a call regarding results.  [phone]

## 2018-12-06 DIAGNOSIS — M5417 Radiculopathy, lumbosacral region: Secondary | ICD-10-CM | POA: Diagnosis not present

## 2018-12-06 DIAGNOSIS — M545 Low back pain: Secondary | ICD-10-CM | POA: Diagnosis not present

## 2018-12-06 DIAGNOSIS — G970 Cerebrospinal fluid leak from spinal puncture: Secondary | ICD-10-CM | POA: Diagnosis not present

## 2018-12-06 DIAGNOSIS — R51 Headache: Secondary | ICD-10-CM | POA: Diagnosis not present

## 2018-12-14 ENCOUNTER — Ambulatory Visit (HOSPITAL_COMMUNITY): Admission: RE | Admit: 2018-12-14 | Payer: BC Managed Care – PPO | Source: Ambulatory Visit

## 2018-12-15 ENCOUNTER — Encounter: Payer: Self-pay | Admitting: Advanced Practice Midwife

## 2018-12-20 DIAGNOSIS — R51 Headache: Secondary | ICD-10-CM | POA: Diagnosis not present

## 2018-12-20 DIAGNOSIS — M545 Low back pain: Secondary | ICD-10-CM | POA: Diagnosis not present

## 2018-12-20 DIAGNOSIS — G970 Cerebrospinal fluid leak from spinal puncture: Secondary | ICD-10-CM | POA: Diagnosis not present

## 2019-01-07 ENCOUNTER — Institutional Professional Consult (permissible substitution): Payer: BC Managed Care – PPO | Admitting: Physician Assistant

## 2019-01-10 DIAGNOSIS — M79662 Pain in left lower leg: Secondary | ICD-10-CM | POA: Diagnosis not present

## 2019-01-10 DIAGNOSIS — M5417 Radiculopathy, lumbosacral region: Secondary | ICD-10-CM | POA: Diagnosis not present

## 2019-01-10 DIAGNOSIS — G250 Essential tremor: Secondary | ICD-10-CM | POA: Diagnosis not present

## 2019-01-24 ENCOUNTER — Other Ambulatory Visit: Payer: Self-pay | Admitting: Advanced Practice Midwife

## 2019-02-04 ENCOUNTER — Other Ambulatory Visit: Payer: Self-pay | Admitting: Advanced Practice Midwife

## 2019-05-05 ENCOUNTER — Other Ambulatory Visit: Payer: Self-pay | Admitting: Advanced Practice Midwife

## 2019-06-20 DIAGNOSIS — M25552 Pain in left hip: Secondary | ICD-10-CM | POA: Diagnosis not present

## 2019-06-20 DIAGNOSIS — M5417 Radiculopathy, lumbosacral region: Secondary | ICD-10-CM | POA: Diagnosis not present

## 2019-06-20 DIAGNOSIS — R201 Hypoesthesia of skin: Secondary | ICD-10-CM | POA: Diagnosis not present

## 2019-06-20 DIAGNOSIS — G4700 Insomnia, unspecified: Secondary | ICD-10-CM | POA: Diagnosis not present

## 2019-07-14 DIAGNOSIS — M533 Sacrococcygeal disorders, not elsewhere classified: Secondary | ICD-10-CM | POA: Diagnosis not present

## 2019-07-14 DIAGNOSIS — M25552 Pain in left hip: Secondary | ICD-10-CM | POA: Diagnosis not present

## 2019-07-14 DIAGNOSIS — M25551 Pain in right hip: Secondary | ICD-10-CM | POA: Diagnosis not present

## 2019-07-14 DIAGNOSIS — M545 Low back pain: Secondary | ICD-10-CM | POA: Diagnosis not present

## 2019-07-20 DIAGNOSIS — M545 Low back pain: Secondary | ICD-10-CM | POA: Diagnosis not present

## 2019-07-20 DIAGNOSIS — M533 Sacrococcygeal disorders, not elsewhere classified: Secondary | ICD-10-CM | POA: Diagnosis not present

## 2019-07-20 DIAGNOSIS — M25551 Pain in right hip: Secondary | ICD-10-CM | POA: Diagnosis not present

## 2019-07-20 DIAGNOSIS — M25552 Pain in left hip: Secondary | ICD-10-CM | POA: Diagnosis not present

## 2019-08-11 DIAGNOSIS — M545 Low back pain: Secondary | ICD-10-CM | POA: Diagnosis not present

## 2019-09-09 DIAGNOSIS — R5383 Other fatigue: Secondary | ICD-10-CM | POA: Diagnosis not present

## 2019-09-09 DIAGNOSIS — J029 Acute pharyngitis, unspecified: Secondary | ICD-10-CM | POA: Diagnosis not present

## 2019-09-09 DIAGNOSIS — Z6821 Body mass index (BMI) 21.0-21.9, adult: Secondary | ICD-10-CM | POA: Diagnosis not present

## 2019-10-14 ENCOUNTER — Other Ambulatory Visit: Payer: Self-pay

## 2019-10-14 ENCOUNTER — Ambulatory Visit: Payer: BC Managed Care – PPO | Admitting: Physician Assistant

## 2019-10-14 ENCOUNTER — Encounter: Payer: Self-pay | Admitting: Physician Assistant

## 2019-10-14 VITALS — BP 105/71 | HR 86 | Temp 97.9°F | Ht 63.0 in | Wt 117.0 lb

## 2019-10-14 DIAGNOSIS — G43909 Migraine, unspecified, not intractable, without status migrainosus: Secondary | ICD-10-CM | POA: Diagnosis not present

## 2019-10-14 DIAGNOSIS — M25559 Pain in unspecified hip: Secondary | ICD-10-CM

## 2019-10-14 DIAGNOSIS — F320 Major depressive disorder, single episode, mild: Secondary | ICD-10-CM

## 2019-10-14 DIAGNOSIS — F410 Panic disorder [episodic paroxysmal anxiety] without agoraphobia: Secondary | ICD-10-CM

## 2019-10-14 DIAGNOSIS — M549 Dorsalgia, unspecified: Secondary | ICD-10-CM

## 2019-10-14 DIAGNOSIS — G8929 Other chronic pain: Secondary | ICD-10-CM

## 2019-10-14 MED ORDER — DOXYCYCLINE HYCLATE 100 MG PO TABS: 100.0000 mg | ORAL_TABLET | Freq: Two times a day (BID) | ORAL | 0 refills | Status: DC
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
## 2019-10-14 NOTE — Patient Instructions (Signed)

## 2019-10-31 ENCOUNTER — Other Ambulatory Visit: Payer: Self-pay | Admitting: Advanced Practice Midwife

## 2019-11-09 DIAGNOSIS — M533 Sacrococcygeal disorders, not elsewhere classified: Secondary | ICD-10-CM | POA: Diagnosis not present

## 2019-11-14 ENCOUNTER — Other Ambulatory Visit: Payer: Self-pay

## 2019-11-14 ENCOUNTER — Encounter: Payer: Self-pay | Admitting: Family Medicine

## 2019-11-14 ENCOUNTER — Ambulatory Visit: Payer: BC Managed Care – PPO | Admitting: Family Medicine

## 2019-11-14 VITALS — BP 125/86 | HR 110 | Temp 98.2°F | Ht 63.0 in | Wt 113.4 lb

## 2019-11-14 DIAGNOSIS — F320 Major depressive disorder, single episode, mild: Secondary | ICD-10-CM | POA: Diagnosis not present

## 2019-11-14 DIAGNOSIS — J4520 Mild intermittent asthma, uncomplicated: Secondary | ICD-10-CM

## 2019-11-14 DIAGNOSIS — F411 Generalized anxiety disorder: Secondary | ICD-10-CM

## 2019-11-14 MED ORDER — HYDROXYZINE HCL 10 MG PO TABS: 10.0000 mg | ORAL_TABLET | Freq: Three times a day (TID) | ORAL | 2 refills | Status: DC | PRN

## 2019-11-14 MED ORDER — SERTRALINE HCL 100 MG PO TABS: 100.0000 mg | ORAL_TABLET | Freq: Every day | ORAL | 2 refills | Status: DC

## 2019-11-14 MED ORDER — ALBUTEROL SULFATE HFA 108 (90 BASE) MCG/ACT IN AERS: 1.0000 | INHALATION_SPRAY | Freq: Four times a day (QID) | RESPIRATORY_TRACT | 2 refills | Status: DC | PRN
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
## 2019-11-27 ENCOUNTER — Other Ambulatory Visit: Payer: Self-pay | Admitting: Family Medicine

## 2019-11-27 DIAGNOSIS — F411 Generalized anxiety disorder: Secondary | ICD-10-CM

## 2019-11-27 DIAGNOSIS — F320 Major depressive disorder, single episode, mild: Secondary | ICD-10-CM

## 2019-12-27 ENCOUNTER — Ambulatory Visit: Payer: BC Managed Care – PPO | Admitting: Family Medicine

## 2020-02-23 ENCOUNTER — Encounter: Payer: Self-pay | Admitting: Family Medicine

## 2020-02-23 DIAGNOSIS — M549 Dorsalgia, unspecified: Secondary | ICD-10-CM | POA: Diagnosis not present

## 2020-02-26 ENCOUNTER — Other Ambulatory Visit: Payer: Self-pay | Admitting: Family Medicine

## 2020-02-26 ENCOUNTER — Other Ambulatory Visit: Payer: Self-pay | Admitting: Advanced Practice Midwife

## 2020-02-26 DIAGNOSIS — F411 Generalized anxiety disorder: Secondary | ICD-10-CM

## 2020-02-26 DIAGNOSIS — F320 Major depressive disorder, single episode, mild: Secondary | ICD-10-CM

## 2020-04-13 ENCOUNTER — Other Ambulatory Visit: Payer: Self-pay | Admitting: Family Medicine

## 2020-04-13 DIAGNOSIS — F320 Major depressive disorder, single episode, mild: Secondary | ICD-10-CM

## 2020-04-13 DIAGNOSIS — F411 Generalized anxiety disorder: Secondary | ICD-10-CM

## 2020-05-01 ENCOUNTER — Other Ambulatory Visit: Payer: Self-pay

## 2020-05-01 ENCOUNTER — Encounter (HOSPITAL_COMMUNITY): Payer: Self-pay | Admitting: Emergency Medicine

## 2020-05-01 ENCOUNTER — Emergency Department (HOSPITAL_COMMUNITY): Payer: 59

## 2020-05-01 ENCOUNTER — Emergency Department (HOSPITAL_COMMUNITY)
Admission: EM | Admit: 2020-05-01 | Discharge: 2020-05-01 | Discharge status: Against Medical Advice | Payer: 59 | Attending: Emergency Medicine | Admitting: Emergency Medicine

## 2020-05-01 DIAGNOSIS — R1012 Left upper quadrant pain: Secondary | ICD-10-CM | POA: Insufficient documentation

## 2020-05-01 DIAGNOSIS — Z87891 Personal history of nicotine dependence: Secondary | ICD-10-CM | POA: Insufficient documentation

## 2020-05-01 DIAGNOSIS — J45909 Unspecified asthma, uncomplicated: Secondary | ICD-10-CM | POA: Insufficient documentation

## 2020-05-01 DIAGNOSIS — M25511 Pain in right shoulder: Secondary | ICD-10-CM | POA: Insufficient documentation

## 2020-05-01 DIAGNOSIS — R1013 Epigastric pain: Secondary | ICD-10-CM | POA: Insufficient documentation

## 2020-05-01 DIAGNOSIS — G8929 Other chronic pain: Secondary | ICD-10-CM

## 2020-05-01 LAB — CBC WITH DIFFERENTIAL/PLATELET
Abs Immature Granulocytes: 0.02 10*3/uL (ref 0.00–0.07)
Basophils Absolute: 0.1 10*3/uL (ref 0.0–0.1)
Basophils Relative: 1 %
Eosinophils Absolute: 0.3 10*3/uL (ref 0.0–0.5)
Eosinophils Relative: 4 %
HCT: 43.4 % (ref 36.0–46.0)
Hemoglobin: 14.2 g/dL (ref 12.0–15.0)
Immature Granulocytes: 0 %
Lymphocytes Relative: 38 %
Lymphs Abs: 2.8 10*3/uL (ref 0.7–4.0)
MCH: 30.2 pg (ref 26.0–34.0)
MCHC: 32.7 g/dL (ref 30.0–36.0)
MCV: 92.3 fL (ref 80.0–100.0)
Monocytes Absolute: 0.4 10*3/uL (ref 0.1–1.0)
Monocytes Relative: 6 %
Neutro Abs: 3.8 10*3/uL (ref 1.7–7.7)
Neutrophils Relative %: 51 %
Platelets: 302 10*3/uL (ref 150–400)
RBC: 4.7 MIL/uL (ref 3.87–5.11)
RDW: 13 % (ref 11.5–15.5)
WBC: 7.3 10*3/uL (ref 4.0–10.5)
nRBC: 0 % (ref 0.0–0.2)

## 2020-05-01 LAB — URINALYSIS, ROUTINE W REFLEX MICROSCOPIC
Bilirubin Urine: NEGATIVE
Glucose, UA: NEGATIVE mg/dL
Hgb urine dipstick: NEGATIVE
Ketones, ur: NEGATIVE mg/dL
Leukocytes,Ua: NEGATIVE
Nitrite: NEGATIVE
Protein, ur: NEGATIVE mg/dL
Specific Gravity, Urine: 1.013 (ref 1.005–1.030)
pH: 7 (ref 5.0–8.0)

## 2020-05-01 LAB — COMPREHENSIVE METABOLIC PANEL
ALT: 12 U/L (ref 0–44)
AST: 17 U/L (ref 15–41)
Albumin: 3.8 g/dL (ref 3.5–5.0)
Alkaline Phosphatase: 53 U/L (ref 38–126)
Anion gap: 8 (ref 5–15)
BUN: 12 mg/dL (ref 6–20)
CO2: 26 mmol/L (ref 22–32)
Calcium: 8.7 mg/dL — ABNORMAL LOW (ref 8.9–10.3)
Chloride: 103 mmol/L (ref 98–111)
Creatinine, Ser: 0.93 mg/dL (ref 0.44–1.00)
GFR, Estimated: >60 mL/min (ref >60)
Glucose, Bld: 78 mg/dL (ref 70–99)
Potassium: 3.7 mmol/L (ref 3.5–5.1)
Sodium: 137 mmol/L (ref 135–145)
Total Bilirubin: 0.6 mg/dL (ref 0.3–1.2)
Total Protein: 7 g/dL (ref 6.5–8.1)

## 2020-05-01 LAB — LIPASE, BLOOD: Lipase: 36 U/L (ref 11–51)

## 2020-05-01 MED ORDER — FENTANYL CITRATE (PF) 100 MCG/2ML IJ SOLN
50.0000 ug | Freq: Once | INTRAMUSCULAR | Status: AC
  Administered 2020-05-01: 50 ug via INTRAVENOUS
  Filled 2020-05-01: qty 2

## 2020-05-01 MED ORDER — ONDANSETRON HCL 4 MG/2ML IJ SOLN
4.0000 mg | Freq: Once | INTRAMUSCULAR | Status: AC
  Administered 2020-05-01: 4 mg via INTRAVENOUS
  Filled 2020-05-01: qty 2

## 2020-05-01 NOTE — ED Triage Notes (Signed)
Pt states she has rt shoulder pain for the past 6 months.  She also has a cough x2 wks with fatigue. Pt states she has lost 15 pounds this month.  Pt also states she has stabbing pain in her shoulder blade.

## 2020-05-01 NOTE — ED Provider Notes (Signed)
[facility] EMERGENCY DEPARTMENT Provider Note   CSN: [id number] Arrival date & time: 05/01/20  1138     History Chief Complaint  Patient presents with  . Shoulder Pain    Pamela Gallagher is a 35 y.o. female with extensive medical history who presents with concern for 6 months of right shoulder pain, as well as 3 weeks of cough and fatigue.  Patient states she has lost 15 pounds in the past month.  Additionally she is endorsing left-sided upper belly pain since she was pregnant with her daughter, who is now 35 months old.  She states that the pain has become worse in the last 24 hours and so she is seeking care for treatrment of pain.   Patient has not sought evaluation for these problems prior to today due to gap in insurance coverage.  She endorses multiple episodes of NBNB emesis daily secondary to the pain x a few weeks.  Denies diarrhea, melena, hematochezia, however does endorse loss of appetite due to pain.  Denies chest pain, shortness of breath but does endorse persistent cough since illness in December.  She is not vaccinated with COVID-19, and did not undergo COVID-19 testing.  I personally reviewed this patient's medical records.  She has history of PTSD, anxiety and depression, history of chronic pain from fibromyalgia on gabapentin, endometriosis, migraines, benzodiazepine dependence, marijuana use, MDD, panic attacks.  HPI     Past Medical History:  Diagnosis Date  . Abnormal Pap smear 2011  . Anxiety and depression   . Asthma   . Endometriosis   . Migraines   . PONV (postoperative nausea and vomiting)   . PTSD (post-traumatic stress disorder)    "adultified child"  . UTI (lower urinary tract infection)   . Vaginal Pap smear, abnormal     Patient Active Problem List   Diagnosis Date Noted  . Asthma 10/25/2018  . Endometriosis 05/12/2018  . Marijuana use 05/10/2018  . Supervision of normal first pregnancy 04/30/2018  . Panic attacks 09/23/2017  . Severe  episode of recurrent major depressive disorder, without psychotic features (HCC) 04/22/2017  . Benzodiazepine dependence (HCC) 10/07/2012  . Depression 04/24/2011    Past Surgical History:  Procedure Laterality Date  . BIOPSY  11/27/2014   Procedure: BIOPSY of endometriosis;  Surgeon: Osborn Coho, MD;  Location: [facility];  Service: Gynecology;;  . Christianne Borrow  11/27/2014   Procedure: CHROMOPERTUBATION;  Surgeon: Osborn Coho, MD;  Location: [facility];  Service: Gynecology;;  . LAPAROSCOPY N/A 11/27/2014   Procedure: LAPAROSCOPY OPERATIVE;  Surgeon: Osborn Coho, MD;  Location: [facility];  Service: Gynecology;  Laterality: N/A;  . thyroglossoduct cyst     x 2  . WISDOM TOOTH EXTRACTION       OB History    Gravida  1   Para  1   Term  1   Preterm      AB      Living  1     SAB      IAB      Ectopic      Multiple  0   Live Births  1           Family History  Problem Relation Age of Onset  . Depression Mother   . Anxiety disorder Mother   . Depression Father   . Hearing loss Paternal Uncle   . Aneurysm Maternal Aunt   . COPD Maternal Grandmother   . Heart disease Paternal Grandfather  Social History   Tobacco Use  . Smoking status: Former Smoker    Packs/day: 0.25    Years: 0.50    Pack years: 0.12    Types: Cigarettes    Quit date: 05/31/2018    Years since quitting: 1.9  . Smokeless tobacco: Never Used  . Tobacco comment: 3 cigs per day  Vaping Use  . Vaping Use: Former  Substance Use Topics  . Alcohol use: Not Currently    Comment: social  . Drug use: Yes    Types: Marijuana    Comment: occasional    Home Medications Prior to Admission medications   Medication Sig Start Date End Date Taking? Authorizing Provider  albuterol (VENTOLIN HFA) 108 (90 Base) MCG/ACT inhaler Inhale 1-2 puffs into the lungs every 6 (six) hours as needed for wheezing or shortness of breath. 11/14/19   Gwenlyn Fudge, FNP  ALPRAZolam Prudy Feeler) 0.5 MG tablet Take  1-2 tablets by mouth at bedtime. 06/24/19   [provider]  diclofenac (VOLTAREN) 75 MG EC tablet Take by mouth. 03/08/19   [provider]  gabapentin (NEURONTIN) 300 MG capsule 300 mg at bedtime.  08/11/19   [provider]  hydrOXYzine (ATARAX/VISTARIL) 10 MG tablet Take 1 tablet (10 mg total) by mouth 3 (three) times daily as needed for anxiety. 11/14/19   Gwenlyn Fudge, FNP  omeprazole (PRILOSEC) 20 MG capsule TAKE 1 CAPSULE BY MOUTH EVERY DAY 05/05/19   Cresenzo-Dishmon, Scarlette Calico, CNM  pregabalin (LYRICA) 100 MG capsule Take 100 mg by mouth 2 (two) times daily.    [provider]  sertraline (ZOLOFT) 100 MG tablet Take 1 tablet (100 mg total) by mouth daily. (Needs to be seen before next refill) 02/27/20   Gwenlyn Fudge, FNP  SPRINTEC 28 0.25-35 MG-MCG tablet TAKE 1 TABLET BY MOUTH EVERY DAY 11/01/19   Cresenzo-Dishmon, Scarlette Calico, CNM  tiZANidine (ZANAFLEX) 4 MG tablet TAKE 1 TO 2 TABLETS BY MOUTH EVERY EVENING 07/13/19   [provider]    Allergies    Clindamycin/lincomycin, Penicillins, and Sulfa antibiotics  Review of Systems   Review of Systems  Constitutional: Positive for unexpected weight change. Negative for activity change, appetite change, chills, fatigue and fever.       15 lbs in one month  HENT: Negative.   Respiratory: Positive for cough. Negative for chest tightness, shortness of breath and wheezing.   Cardiovascular: Negative for chest pain, palpitations and leg swelling.  Gastrointestinal: Positive for abdominal pain, nausea and vomiting. Negative for blood in stool, constipation and diarrhea.  Genitourinary: Negative.   Musculoskeletal: Positive for back pain.       Right shoulder pain  Skin: Negative.   Neurological: Negative.   Hematological: Negative.     Physical Exam Updated Vital Signs BP (!) 140/100   Pulse (!) 108   Temp 98.3 F (36.8 C) (Oral)   Resp 20   Ht 5\' 3"  (1.6 m)   Wt 47.6 kg   LMP 04/17/2020  (Approximate)   SpO2 100%   Breastfeeding No   BMI 18.60 kg/m   Physical Exam Vitals and nursing note reviewed.  Constitutional:      Appearance: She is normal weight.  HENT:     Head: Normocephalic and atraumatic.     Nose: Nose normal.     Mouth/Throat:     Mouth: Mucous membranes are moist.     Pharynx: Oropharynx is clear. Uvula midline. No oropharyngeal exudate or posterior oropharyngeal erythema.  Tonsils: No tonsillar exudate.  Eyes:     General:        Right eye: No discharge.        Left eye: No discharge.     Conjunctiva/sclera: Conjunctivae normal.     Pupils: Pupils are equal, round, and reactive to light.  Neck:     Trachea: Trachea and phonation normal.  Cardiovascular:     Rate and Rhythm: Normal rate and regular rhythm.     Pulses: Normal pulses.          Radial pulses are 2+ on the right side and 2+ on the left side.       Dorsalis pedis pulses are 2+ on the right side and 2+ on the left side.     Heart sounds: Normal heart sounds. No murmur heard.   Pulmonary:     Effort: Pulmonary effort is normal. No respiratory distress.     Breath sounds: Normal breath sounds. No wheezing or rales.  Chest:     Chest wall: No deformity, swelling, tenderness, crepitus or edema.  Abdominal:     General: Bowel sounds are normal. There is no distension.     Palpations: Abdomen is soft. There is no hepatomegaly or splenomegaly.     Tenderness: There is abdominal tenderness in the epigastric area and left upper quadrant. There is no right CVA tenderness, left CVA tenderness, guarding or rebound.     Comments: Exquisite LUQ TTP, without mass   Musculoskeletal:        General: No deformity.     Cervical back: Normal, full passive range of motion without pain, normal range of motion and neck supple. No signs of trauma, rigidity, spasms, tenderness, bony tenderness or crepitus. Muscular tenderness present. No pain with movement or spinous process tenderness.     Thoracic  back: Spasms and tenderness present. No deformity or bony tenderness.     Lumbar back: Normal. No spasms, tenderness or bony tenderness.       Back:     Right lower leg: No edema.     Left lower leg: No edema.  Lymphadenopathy:     Cervical: No cervical adenopathy.  Skin:    General: Skin is warm and dry.     Capillary Refill: Capillary refill takes less than 2 seconds.  Neurological:     General: No focal deficit present.     Mental Status: She is alert and oriented to person, place, and time.     Sensory: Sensation is intact.     Motor: Motor function is intact.  Psychiatric:        Mood and Affect: Mood normal. Affect is tearful.     ED Results / Procedures / Treatments   Labs (all labs ordered are listed, but only abnormal results are displayed) Labs Reviewed  COMPREHENSIVE METABOLIC PANEL - Abnormal; Notable for the following components:      Result Value   Calcium 8.7 (*)    All other components within normal limits  SARS CORONAVIRUS 2 (TAT 6-24 HRS)  CBC WITH DIFFERENTIAL/PLATELET  LIPASE, BLOOD  URINALYSIS, ROUTINE W REFLEX MICROSCOPIC  POC URINE PREG, ED    EKG None  Radiology DG Shoulder Right  Result Date: 05/01/2020 CLINICAL DATA:  Right shoulder pain. EXAM: RIGHT SHOULDER - 2+ VIEW COMPARISON:  Chest x-ray 09/15/2015. FINDINGS: No acute bony or joint abnormality. No evidence of fracture or dislocation. IMPRESSION: No acute abnormality. Electronically Signed   By: Maisie Fushomas  Register   On: 05/01/2020  13:23   DG Chest Portable 1 View  Result Date: 05/01/2020 CLINICAL DATA:  Pain and cough EXAM: PORTABLE CHEST 1 VIEW COMPARISON:  Sep 15, 2015 FINDINGS: Lungs are clear. Heart size and pulmonary vascularity are normal. No adenopathy. No pneumothorax. No bone lesions. IMPRESSION: Lungs clear.  Cardiac silhouette normal. Electronically Signed   By: Bretta Bang III M.D.   On: 05/01/2020 13:22    Procedures Procedures (including critical care  time)  Medications Ordered in ED Medications  fentaNYL (SUBLIMAZE) injection 50 mcg (50 mcg Intravenous Given 05/01/20 1438)  ondansetron (ZOFRAN) injection 4 mg (4 mg Intravenous Given 05/01/20 1438)    ED Course  I have reviewed the triage vital signs and the nursing notes.  Pertinent labs & imaging results that were available during my care of the patient were reviewed by me and considered in my medical decision making (see chart for details).    MDM Rules/Calculators/A&P                         35 year old female with history of chronic pain secondary to multiple medical comorbidities who presents with concern for 6 months of progressively worsening right shoulder pain, as well as left upper quadrant belly pain that has been persistent for the last 2 years.  States pain is worse in the last 24 hours, has become unbearable.   Patient tachycardic on intake to 104, vital signs otherwise normal.  Physical exam significant for tenderness to palpation over the right thoracic paraspinous musculature, along the angle of the scapula, as well as exquisite tenderness to palpation of the left upper quadrant of the abdomen. Cardiopulmonary exam normal. We will proceed with basic laboratory studies, imaging of the right shoulder and chest, EKG, CT of the abdomen and pelvis. Concern given patient's reported unintentional weight loss of 15 pounds in 1 month.  We will also proceed with respiratory pathogen panel swab.  Analgesia ordered.  CBC unremarkable, CMP unremarkable, lipase normal, 36.  UA unremarkable.  Plain films of the right shoulder and chest also were negative for acute abnormality.  Shortly after laboratory studies resulted, patient became agitated, yelling profanities at nursing staff due to concern for time and delay in administration of pain medication.  States she needs to leave by 3 PM to pick up her daughter.  This provider spoke with the patient personally; patient began screaming  profanities in this provider's face, demanding her pain medication, stating she needs to leave in the next half hour.  Analgesia was administered, as patient has a driver at this time.  Recommend patient stays for completed medical evaluation.   Patient continued to be agitated, charge nurse was called to the bedside by nursing staff after departure of this provider from the room.  Patient eloped from the emergency department prior to completion of her CT scan or collection of her respiratory pathogen panel swab.  She is welcome to return the emergency department anytime she needs emergent medical evaluation.  This chart was dictated using voice recognition software, Dragon. Despite the best efforts of this provider to proofread and correct errors, errors may still occur which can change documentation meaning.  Final Clinical Impression(s) / ED Diagnoses Final diagnoses:  None    Rx / DC Orders ED Discharge Orders    None       Sherrilee Gilles 05/01/20 1727    Bethann Berkshire, MD 05/04/20 [phone]

## 2020-05-01 NOTE — ED Notes (Signed)
Patient became hostile and began using profanity because she had not received medication. States she had to leave by 3pm to pick up her child. Pa talked with her and she was still unreasonable.Charge nurse Georgann Housekeeper notified and meds given by same

## 2020-06-26 ENCOUNTER — Encounter: Payer: Self-pay | Admitting: Family Medicine

## 2020-06-26 ENCOUNTER — Ambulatory Visit (INDEPENDENT_AMBULATORY_CARE_PROVIDER_SITE_OTHER): Payer: 59

## 2020-06-26 ENCOUNTER — Ambulatory Visit (INDEPENDENT_AMBULATORY_CARE_PROVIDER_SITE_OTHER): Payer: 59 | Admitting: Family Medicine

## 2020-06-26 VITALS — BP 106/72 | HR 71 | Temp 97.9°F

## 2020-06-26 DIAGNOSIS — R059 Cough, unspecified: Secondary | ICD-10-CM

## 2020-06-26 DIAGNOSIS — Z8616 Personal history of COVID-19: Secondary | ICD-10-CM

## 2020-06-26 DIAGNOSIS — M25531 Pain in right wrist: Secondary | ICD-10-CM | POA: Diagnosis not present

## 2020-06-26 DIAGNOSIS — W19XXXD Unspecified fall, subsequent encounter: Secondary | ICD-10-CM | POA: Diagnosis not present

## 2020-06-26 MED ORDER — BENZONATATE 100 MG PO CAPS: 100.0000 mg | ORAL_CAPSULE | Freq: Three times a day (TID) | ORAL | 3 refills | Status: DC | PRN

## 2020-06-26 MED ORDER — PROMETHAZINE-DM 6.25-15 MG/5ML PO SYRP: 2.5000 mL | ORAL_SOLUTION | Freq: Every evening | ORAL | 0 refills | Status: DC | PRN

## 2020-06-26 NOTE — Progress Notes (Signed)
Subjective: CC: Cough PCP: Gwenlyn Fudge, FNP Pamela Gallagher is a 35 y.o. female presenting to clinic today for:  1. Cough Onset after she was diagnosed with COVID for the second time in January.  She reports ongoing harsh cough. Using OTC cough meds but not helpful.  Has not had CXR.  No hemoptysis, fevers.  Gets rib pain due to persistent cough.  Reports dyspnea on exertion.  2. Wrist pain Patient fell on outstretched hand several weeks ago. Injured right wrist. Right hand dominant.  She reports ongoing wrist pain, resolved swelling.  Cannot flex or extend without significant pain shooting up her forearm.   ROS: Per HPI  Allergies  Allergen Reactions  . Clindamycin/Lincomycin Shortness Of Breath  . Penicillins Anaphylaxis    Has patient had a PCN reaction causing immediate rash, facial/tongue/throat swelling, SOB or lightheadedness with hypotension: Yes Has patient had a PCN reaction causing severe rash involving mucus membranes or skin necrosis: Yes Has patient had a PCN reaction that required hospitalization Yes Has patient had a PCN reaction occurring within the last 10 years: No If all of the above answers are "NO", then may proceed with Cephalosporin use.   . Sulfa Antibiotics Anaphylaxis   Past Medical History:  Diagnosis Date  . Abnormal Pap smear 2011  . Anxiety and depression   . Asthma   . Endometriosis   . Migraines   . PONV (postoperative nausea and vomiting)   . PTSD (post-traumatic stress disorder)    "adultified child"  . UTI (lower urinary tract infection)   . Vaginal Pap smear, abnormal     Current Outpatient Medications:  .  albuterol (VENTOLIN HFA) 108 (90 Base) MCG/ACT inhaler, Inhale 1-2 puffs into the lungs every 6 (six) hours as needed for wheezing or shortness of breath., Disp: 18 g, Rfl: 2 .  ALPRAZolam (XANAX) 0.5 MG tablet, Take 1-2 tablets by mouth at bedtime., Disp: , Rfl:  .  diclofenac (VOLTAREN) 75 MG EC tablet, Take by mouth.,  Disp: , Rfl:  .  gabapentin (NEURONTIN) 300 MG capsule, 300 mg at bedtime. , Disp: , Rfl:  .  hydrOXYzine (ATARAX/VISTARIL) 10 MG tablet, Take 1 tablet (10 mg total) by mouth 3 (three) times daily as needed for anxiety., Disp: 60 tablet, Rfl: 2 .  omeprazole (PRILOSEC) 20 MG capsule, TAKE 1 CAPSULE BY MOUTH EVERY DAY, Disp: 90 capsule, Rfl: 1 .  pregabalin (LYRICA) 100 MG capsule, Take 100 mg by mouth 2 (two) times daily., Disp: , Rfl:  .  sertraline (ZOLOFT) 100 MG tablet, Take 1 tablet (100 mg total) by mouth daily. (Needs to be seen before next refill), Disp: 30 tablet, Rfl: 0 .  SPRINTEC 28 0.25-35 MG-MCG tablet, TAKE 1 TABLET BY MOUTH EVERY DAY, Disp: 84 tablet, Rfl: 3 .  tiZANidine (ZANAFLEX) 4 MG tablet, TAKE 1 TO 2 TABLETS BY MOUTH EVERY EVENING, Disp: , Rfl:  Social History   Socioeconomic History  . Marital status: Single    Spouse name: Gerilyn Pilgrim  . Number of children: 1  . Years of education: Not on file  . Highest education level: Not on file  Occupational History  . Not on file  Tobacco Use  . Smoking status: Former Smoker    Packs/day: 0.25    Years: 0.50    Pack years: 0.12    Types: Cigarettes    Quit date: 05/31/2018    Years since quitting: 2.0  . Smokeless tobacco: Never Used  . Tobacco comment:  3 cigs per day  Vaping Use  . Vaping Use: Former  Substance and Sexual Activity  . Alcohol use: Not Currently    Comment: social  . Drug use: Yes    Types: Marijuana    Comment: occasional  . Sexual activity: Not Currently    Birth control/protection: None  Other Topics Concern  . Not on file  Social History Narrative  . Not on file   Social Determinants of Health   Financial Resource Strain: Not on file  Food Insecurity: Not on file  Transportation Needs: Not on file  Physical Activity: Not on file  Stress: Not on file  Social Connections: Not on file  Intimate Partner Violence: Not on file   Family History  Problem Relation Age of Onset  . Depression  Mother   . Anxiety disorder Mother   . Depression Father   . Hearing loss Paternal Uncle   . Aneurysm Maternal Aunt   . COPD Maternal Grandmother   . Heart disease Paternal Grandfather     Objective: Office vital signs reviewed. BP 106/72   Pulse 71   Temp 97.9 F (36.6 C) (Temporal)   SpO2 99%   Physical Examination:  General: Awake, alert, well nourished, nontoxic. No acute distress Cardio: regular rate and rhythm, S1S2 heard, no murmurs appreciated Pulm: clear to auscultation bilaterally, no wheezes, rhonchi or rales; normal work of breathing on room air; coughs harshly (sounds wet) MSK: limited extension/ flexion of right wrist. No visible deformity/ swelling  Assessment/ Plan: 35 y.o. female   Cough - Plan: DG Chest 2 View, Ambulatory referral to Pulmonology, benzonatate (TESSALON PERLES) 100 MG capsule, promethazine-dextromethorphan (PROMETHAZINE-DM) 6.25-15 MG/5ML syrup  Right wrist pain - Plan: DG Wrist Complete Right, Ambulatory referral to Orthopedic Surgery  Fall, subsequent encounter - Plan: DG Wrist Complete Right, Ambulatory referral to Orthopedic Surgery  Personal history of COVID-19 - Plan: benzonatate (TESSALON PERLES) 100 MG capsule, promethazine-dextromethorphan (PROMETHAZINE-DM) 6.25-15 MG/5ML syrup  Check CXR.  Likely long haul COVID.  Referral to pulm for PFTs, assessment.  Cough suppressants sent.  Wrist may be complicated sprain vs poorly healed fracture.  Check xray.  Referral to ortho in [place]. Analgesics, ices. Consider soft brace.  No orders of the defined types were placed in this encounter.  No orders of the defined types were placed in this encounter.    Raliegh Ip, DO [facility] [phone]

## 2020-08-22 ENCOUNTER — Encounter: Payer: Self-pay | Admitting: Family Medicine

## 2020-08-22 ENCOUNTER — Ambulatory Visit: Payer: Medicaid Other | Admitting: Family Medicine

## 2020-08-22 ENCOUNTER — Other Ambulatory Visit: Payer: Self-pay

## 2020-08-22 VITALS — BP 116/76 | HR 79 | Temp 98.2°F | Ht 63.0 in | Wt 108.6 lb

## 2020-08-22 DIAGNOSIS — F331 Major depressive disorder, recurrent, moderate: Secondary | ICD-10-CM | POA: Diagnosis not present

## 2020-08-22 DIAGNOSIS — F411 Generalized anxiety disorder: Secondary | ICD-10-CM

## 2020-08-22 DIAGNOSIS — F419 Anxiety disorder, unspecified: Secondary | ICD-10-CM

## 2020-08-22 DIAGNOSIS — M797 Fibromyalgia: Secondary | ICD-10-CM

## 2020-08-22 DIAGNOSIS — I7300 Raynaud's syndrome without gangrene: Secondary | ICD-10-CM

## 2020-08-22 DIAGNOSIS — F320 Major depressive disorder, single episode, mild: Secondary | ICD-10-CM

## 2020-08-22 MED ORDER — AMLODIPINE BESYLATE 5 MG PO TABS
5.0000 mg | ORAL_TABLET | Freq: Every day | ORAL | 2 refills | Status: DC
Start: 2020-08-22 — End: 2020-11-19

## 2020-08-22 MED ORDER — TIZANIDINE HCL 4 MG PO TABS: ORAL_TABLET | ORAL | 5 refills | Status: DC

## 2020-08-22 MED ORDER — HYDROXYZINE HCL 10 MG PO TABS: 10.0000 mg | ORAL_TABLET | Freq: Three times a day (TID) | ORAL | 2 refills | Status: DC | PRN

## 2020-08-22 MED ORDER — GABAPENTIN 300 MG PO CAPS: 300.0000 mg | ORAL_CAPSULE | Freq: Three times a day (TID) | ORAL | 5 refills | Status: DC

## 2020-08-22 MED ORDER — SERTRALINE HCL 100 MG PO TABS
100.0000 mg | ORAL_TABLET | Freq: Every day | ORAL | 1 refills | Status: DC
Start: 2020-08-22 — End: 2021-02-11

## 2020-08-22 NOTE — Patient Instructions (Signed)
Please schedule your pap smear.   Raynaud Phenomenon  Raynaud phenomenon is a condition that affects the blood vessels (arteries) that carry blood to your fingers and toes. The arteries that supply blood to your ears, lips, nipples, or the tip of your nose might also be affected. Raynaud phenomenon causes the arteries to become narrow temporarily (spasm). As a result, the flow of blood to the affected areas is temporarily decreased. This usually occurs in response to cold temperatures or stress. During an attack, the skin in the affected areas turns white, then blue, and finally red. You may also feel tingling or numbness in those areas. Attacks usually last for only a brief period, and then the blood flow to the area returns to normal. In most cases, Raynaud phenomenon does not cause serious health problems. What are the causes? In many cases, the cause of this condition is not known. The condition may occur on its own (primary Raynaud phenomenon) or may be associated with other diseases or factors (secondary Raynaud phenomenon). Possible causes may include:  Diseases or medical conditions that damage the arteries.  Injuries and repetitive actions that hurt the hands or feet.  Being exposed to certain chemicals.  Taking medicines that narrow the arteries.  Other medical conditions, such as lupus, scleroderma, rheumatoid arthritis, thyroid problems, blood disorders, Sjogren syndrome, or atherosclerosis. What increases the risk? The following factors may make you more likely to develop this condition:  Being 5-61 years old.  Being female.  Having a family history of Raynaud phenomenon.  Living in a cold climate.  Smoking. What are the signs or symptoms? Symptoms of this condition usually occur when you are exposed to cold temperatures or when you have emotional stress. The symptoms may last for a few minutes or up to several hours. They usually affect your fingers but may also affect  your toes, nipples, lips, ears, or the tip of your nose. Symptoms may include:  Changes in skin color. The skin in the affected areas will turn pale or white. The skin may then change from white to bluish to red as normal blood flow returns to the area.  Numbness, tingling, or pain in the affected areas. In severe cases, symptoms may include:  Skin sores.  Tissues decaying and dying (gangrene). How is this diagnosed? This condition may be diagnosed based on:  Your symptoms and medical history.  A physical exam. During the exam, you may be asked to put your hands in cold water to check for a reaction to cold temperature.  Tests, such as: ? Blood tests to check for other diseases or conditions. ? A test to check the movement of blood through your arteries and veins (vascular ultrasound). ? A test in which the skin at the base of your fingernail is examined under a microscope (nailfold capillaroscopy). How is this treated? Treatment for this condition often involves making lifestyle changes and taking steps to control your exposure to cold temperatures. For more severe cases, medicine (calcium channel blockers) may be used to improve blood flow. Surgery is sometimes done to block the nerves that control the affected arteries, but this is rare. Follow these instructions at home: Avoiding cold temperatures Take these steps to avoid exposure to cold:  If possible, stay indoors during cold weather.  When you go outside during cold weather, dress in layers and wear mittens, a hat, a scarf, and warm footwear.  Wear mittens or gloves when handling ice or frozen food.  Use holders for  glasses or cans containing cold drinks.  Let warm water run for a while before taking a shower or bath.  Warm up the car before driving in cold weather. Lifestyle  If possible, avoid stressful and emotional situations. Try to find ways to manage your stress, such  as: ? Exercise. ? Yoga. ? Meditation. ? Biofeedback.  Do not use any products that contain nicotine or tobacco, such as cigarettes and e-cigarettes. If you need help quitting, ask your health care provider.  Avoid secondhand smoke.  Limit your use of caffeine. ? Switch to decaffeinated coffee, tea, and soda. ? Avoid chocolate.  Avoid vibrating tools and machinery. General instructions  Protect your hands and feet from injuries, cuts, or bruises.  Avoid wearing tight rings or wristbands.  Wear loose fitting socks and comfortable, roomy shoes.  Take over-the-counter and prescription medicines only as told by your health care provider. Contact a health care provider if:  Your discomfort becomes worse despite lifestyle changes.  You develop sores on your fingers or toes that do not heal.  Your fingers or toes turn black.  You have breaks in the skin on your fingers or toes.  You have a fever.  You have pain or swelling in your joints.  You have a rash.  Your symptoms occur on only one side of your body. Summary  Raynaud phenomenon is a condition that affects the arteries that carry blood to your fingers, toes, ears, lips, nipples, or the tip of your nose.  In many cases, the cause of this condition is not known.  Symptoms of this condition include changes in skin color, and numbness and tingling of the affected area.  Treatment for this condition includes lifestyle changes, reducing exposure to cold temperatures, and using medicines for severe cases of the condition.  Contact your health care provider if your condition worsens despite treatment. This information is not intended to replace advice given to you by your health care provider. Make sure you discuss any questions you have with your health care provider. Document Revised: 08/18/2019 Document Reviewed: 08/18/2019 Elsevier Patient Education  2021 [employer].

## 2020-08-22 NOTE — Progress Notes (Signed)
Assessment & Plan:  1. Recurrent moderate major depressive disorder with anxiety (HCC) Uncontrolled, but patient wants to see the psychiatrist at the end of the month. Advised if they make changes to her medications to make them aware that she suffers with fibromyalgia so that a medication can be chosen to help depression, anxiety, and fibromyalgia pain.  - hydrOXYzine (ATARAX/VISTARIL) 10 MG tablet; Take 1 tablet (10 mg total) by mouth 3 (three) times daily as needed for anxiety.  Dispense: 60 tablet; Refill: 2 - sertraline (ZOLOFT) 100 MG tablet; Take 1 tablet (100 mg total) by mouth daily.  Dispense: 90 tablet; Refill: 1  2. Raynaud's disease without gangrene Uncontrolled. Started amlodipine. Discussed she does not need rheumatology as we can manage this. Education provided on Raynaud's phenomenon.  - C-reactive protein - Sedimentation rate - ANA - Rheumatoid factor - CYCLIC CITRUL PEPTIDE ANTIBODY, IGG/IGA - amLODipine (NORVASC) 5 MG tablet; Take 1 tablet (5 mg total) by mouth daily.  Dispense: 30 tablet; Refill: 2  3. Fibromyalgia Discussed she does not need rheumatology as we can manage this. Patient to make psychiatrist aware that she has fibromyalgia so that an appropriate medication change can be made.  - gabapentin (NEURONTIN) 300 MG capsule; Take 1 capsule (300 mg total) by mouth 3 (three) times daily.  Dispense: 90 capsule; Refill: 5 - tiZANidine (ZANAFLEX) 4 MG tablet; TAKE 1 TO 2 TABLETS BY MOUTH EVERY EVENING  Dispense: 60 tablet; Refill: 5   Return in about 6 weeks (around 10/03/2020) for follow-up of chronic medication conditions.  Pamela Boston, MSN, APRN, FNP-C [facility]  Subjective:    Patient ID: Pamela Gallagher, female    DOB: 04-27-1985, 35 y.o.   MRN: [id number]  Patient Care Team: Gwenlyn Fudge, FNP as PCP - General (Family Medicine) Aletha Halim, MD as Referring Physician (Specialist)   Chief Complaint:  Chief Complaint   Patient presents with  . Anxiety  . Depression    Check up of chronic medical conditions   . Referral    Rheumatology     HPI: Pamela Gallagher is a 35 y.o. female presenting on 08/22/2020 for Anxiety, Depression (Check up of chronic medical conditions ), and Referral (Rheumatology )  Anxiety/Depression: patient does not feel she is well controlled, but states she is going to see a psychiatrist at the end of the month.   Depression screen [facility] 2/9 08/22/2020 11/14/2019 05/04/2018  Decreased Interest 1 1 0  Down, Depressed, Hopeless 1 1 0  PHQ - 2 Score 2 2 0  Altered sleeping 2 2 3   Tired, decreased energy 2 1 0  Change in appetite 1 1 0  Feeling bad or failure about yourself  1 0 0  Trouble concentrating 1 1 0  Moving slowly or fidgety/restless 0 1 0  Suicidal thoughts 0 0 -  PHQ-9 Score 9 8 3   Difficult doing work/chores Somewhat difficult - -   GAD 7 : Generalized Anxiety Score 08/22/2020 11/14/2019  Nervous, Anxious, on Edge 2 2  Control/stop worrying 1 2  Worry too much - different things 1 2  Trouble relaxing 1 1  Restless 1 1  Easily annoyed or irritable 1 1  Afraid - awful might happen 0 2  Total GAD 7 Score 7 11  Anxiety Difficulty Somewhat difficult -    Pain: patient reports her orthopedic increased her gabapentin from 300 mg QHS to 300 mg TID.   New complaints: Patient believes she needs a  referral to a rheumatologist due to pain and being diagnosed with Raynaud's and fibromyalgia.    Social history:  Relevant past medical, surgical, family and social history reviewed and updated as indicated. Interim medical history since our last visit reviewed.  Allergies and medications reviewed and updated.  DATA REVIEWED: CHART IN EPIC  ROS: Negative unless specifically indicated above in HPI.    Current Outpatient Medications:  .  albuterol (VENTOLIN HFA) 108 (90 Base) MCG/ACT inhaler, Inhale 1-2 puffs into the lungs every 6 (six) hours as needed for wheezing or  shortness of breath., Disp: 18 g, Rfl: 2 .  benzonatate (TESSALON PERLES) 100 MG capsule, Take 1 capsule (100 mg total) by mouth 3 (three) times daily as needed for cough., Disp: 20 capsule, Rfl: 3 .  diclofenac (VOLTAREN) 75 MG EC tablet, Take by mouth., Disp: , Rfl:  .  gabapentin (NEURONTIN) 300 MG capsule, 300 mg 3 (three) times daily., Disp: , Rfl:  .  hydrOXYzine (ATARAX/VISTARIL) 10 MG tablet, Take 1 tablet (10 mg total) by mouth 3 (three) times daily as needed for anxiety., Disp: 60 tablet, Rfl: 2 .  omeprazole (PRILOSEC) 20 MG capsule, TAKE 1 CAPSULE BY MOUTH EVERY DAY, Disp: 90 capsule, Rfl: 1 .  sertraline (ZOLOFT) 100 MG tablet, Take 1 tablet (100 mg total) by mouth daily. (Needs to be seen before next refill), Disp: 30 tablet, Rfl: 0 .  SPRINTEC 28 0.25-35 MG-MCG tablet, TAKE 1 TABLET BY MOUTH EVERY DAY, Disp: 84 tablet, Rfl: 3 .  tiZANidine (ZANAFLEX) 4 MG tablet, TAKE 1 TO 2 TABLETS BY MOUTH EVERY EVENING, Disp: , Rfl:    Allergies  Allergen Reactions  . Clindamycin/Lincomycin Shortness Of Breath  . Penicillins Anaphylaxis    Has patient had a PCN reaction causing immediate rash, facial/tongue/throat swelling, SOB or lightheadedness with hypotension: Yes Has patient had a PCN reaction causing severe rash involving mucus membranes or skin necrosis: Yes Has patient had a PCN reaction that required hospitalization Yes Has patient had a PCN reaction occurring within the last 10 years: No If all of the above answers are "NO", then may proceed with Cephalosporin use.   . Sulfa Antibiotics Anaphylaxis   Past Medical History:  Diagnosis Date  . Abnormal Pap smear 2011  . Anxiety and depression   . Asthma   . Endometriosis   . Migraines   . PONV (postoperative nausea and vomiting)   . PTSD (post-traumatic stress disorder)    "adultified child"    Past Surgical History:  Procedure Laterality Date  . BIOPSY  11/27/2014   Procedure: BIOPSY of endometriosis;  Surgeon: Osborn Coho, MD;  Location: [facility];  Service: Gynecology;;  . Christianne Borrow  11/27/2014   Procedure: CHROMOPERTUBATION;  Surgeon: Osborn Coho, MD;  Location: [facility];  Service: Gynecology;;  . LAPAROSCOPY N/A 11/27/2014   Procedure: LAPAROSCOPY OPERATIVE;  Surgeon: Osborn Coho, MD;  Location: [facility];  Service: Gynecology;  Laterality: N/A;  . thyroglossoduct cyst     x 2  . WISDOM TOOTH EXTRACTION      Social History   Socioeconomic History  . Marital status: Single    Spouse name: Gerilyn Pilgrim  . Number of children: 1  . Years of education: Not on file  . Highest education level: Not on file  Occupational History  . Not on file  Tobacco Use  . Smoking status: Former Smoker    Packs/day: 0.25    Years: 0.50    Pack years: 0.12  Types: Cigarettes    Quit date: 05/31/2018    Years since quitting: 2.2  . Smokeless tobacco: Never Used  . Tobacco comment: 3 cigs per day  Vaping Use  . Vaping Use: Former  Substance and Sexual Activity  . Alcohol use: Not Currently    Comment: social  . Drug use: Yes    Types: Marijuana    Comment: occasional  . Sexual activity: Not Currently    Birth control/protection: None  Other Topics Concern  . Not on file  Social History Narrative  . Not on file   Social Determinants of Health   Financial Resource Strain: Not on file  Food Insecurity: Not on file  Transportation Needs: Not on file  Physical Activity: Not on file  Stress: Not on file  Social Connections: Not on file  Intimate Partner Violence: Not on file        Objective:    BP 116/76   Pulse 79   Temp 98.2 F (36.8 C) (Temporal)   Ht 5\' 3"  (1.6 m)   Wt 108 lb 9.6 oz (49.3 kg)   SpO2 100%   BMI 19.24 kg/m   Wt Readings from Last 3 Encounters:  08/22/20 108 lb 9.6 oz (49.3 kg)  05/01/20 105 lb (47.6 kg)  11/14/19 113 lb 6.4 oz (51.4 kg)    Physical Exam  No results found for: TSH Lab Results  Component Value Date   WBC 7.3 05/01/2020   HGB 14.2 05/01/2020    HCT 43.4 05/01/2020   MCV 92.3 05/01/2020   PLT 302 05/01/2020   Lab Results  Component Value Date   NA 137 05/01/2020   K 3.7 05/01/2020   CO2 26 05/01/2020   GLUCOSE 78 05/01/2020   BUN 12 05/01/2020   CREATININE 0.93 05/01/2020   BILITOT 0.6 05/01/2020   ALKPHOS 53 05/01/2020   AST 17 05/01/2020   ALT 12 05/01/2020   PROT 7.0 05/01/2020   ALBUMIN 3.8 05/01/2020   CALCIUM 8.7 (L) 05/01/2020   ANIONGAP 8 05/01/2020   No results found for: CHOL No results found for: HDL No results found for: LDLCALC No results found for: TRIG No results found for: CHOLHDL No results found for: 06/29/2020

## 2020-08-24 LAB — RHEUMATOID FACTOR: Rheumatoid fact SerPl-aCnc: <10 IU/mL (ref <14.0)

## 2020-08-24 LAB — C-REACTIVE PROTEIN: CRP: 2 mg/L (ref 0–10)

## 2020-08-24 LAB — ANA: Anti Nuclear Antibody (ANA): NEGATIVE

## 2020-08-24 LAB — SEDIMENTATION RATE: Sed Rate: 2 mm/hr (ref 0–32)

## 2020-08-24 LAB — CYCLIC CITRUL PEPTIDE ANTIBODY, IGG/IGA: Cyclic Citrullin Peptide Ab: <1 units (ref 0–19)

## 2020-08-28 ENCOUNTER — Encounter: Payer: Self-pay | Admitting: Family Medicine

## 2020-09-06 ENCOUNTER — Ambulatory Visit: Payer: Medicaid Other | Admitting: Internal Medicine

## 2020-09-06 ENCOUNTER — Other Ambulatory Visit: Payer: Self-pay

## 2020-09-06 ENCOUNTER — Encounter: Payer: Self-pay | Admitting: Internal Medicine

## 2020-09-06 DIAGNOSIS — F17210 Nicotine dependence, cigarettes, uncomplicated: Secondary | ICD-10-CM | POA: Diagnosis not present

## 2020-09-06 DIAGNOSIS — J45991 Cough variant asthma: Secondary | ICD-10-CM

## 2020-09-06 MED ORDER — MOMETASONE FURO-FORMOTEROL FUM 100-5 MCG/ACT IN AERO: INHALATION_SPRAY | RESPIRATORY_TRACT | 2 refills | Status: DC

## 2020-09-06 MED ORDER — FAMOTIDINE 20 MG PO TABS: ORAL_TABLET | ORAL | 11 refills | Status: DC

## 2020-09-06 MED ORDER — OMEPRAZOLE 20 MG PO CPDR: DELAYED_RELEASE_CAPSULE | ORAL | 2 refills | Status: DC

## 2020-09-06 MED ORDER — PREDNISONE 10 MG PO TABS: ORAL_TABLET | ORAL | 0 refills | Status: DC

## 2020-09-06 NOTE — Patient Instructions (Addendum)
Plan A = Automatic = Always=   dulera 100 Take 2 puffs first thing in am and then another 2 puffs about 12 hours later.   Work on inhaler technique:  relax and gently blow all the way out then take a nice smooth deep breath back in, triggering the inhaler at same time you start breathing in.  Hold for up to 5 seconds if you can. Blow out thru nose. Rinse and gargle with water when done   Plan B = Backup (to supplement plan A, not to replace it) Only use your albuterol inhaler as a rescue medication to be used if you can't catch your breath by resting or doing a relaxed purse lip breathing pattern.  - The less you use it, the better it will work when you need it. - Ok to use the inhaler up to 2 puffs  every 4 hours if you must but call for appointment if use goes up over your usual need - Don't leave home without it !!  (think of it like the spare tire for your car)    Try prilosec otc 20mg   Take 30-60 min before first meal of the day and Pepcid ac (famotidine) 20 mg one an hour of bedtime  until cough is completely gone for at least a week without the need for cough suppression  GERD (REFLUX)  is an extremely common cause of respiratory symptoms just like yours , many times with no obvious heartburn at all.    It can be treated with medication, but also with lifestyle changes including elevation of the head of your bed (ideally with 6 -8inch blocks under the headboard of your bed),  Smoking cessation, avoidance of late meals, excessive alcohol, and avoid fatty foods, chocolate, peppermint, colas, red wine, and acidic juices such as orange juice.  NO MINT OR MENTHOL PRODUCTS SO NO COUGH DROPS  USE SUGARLESS CANDY INSTEAD (Jolley ranchers or Stover's or Life Savers) or even ice chips will also do - the key is to swallow to prevent all throat clearing. NO OIL BASED VITAMINS - use powdered substitutes.  Avoid fish oil when coughing.     if not doing better >  Prednisone 10 mg take  4 each am x 2  days,   2 each am x 2 days,  1 each am x 2 days and stop    Please schedule a follow up office visit in 6 weeks, call sooner if needed

## 2020-09-06 NOTE — Assessment & Plan Note (Signed)
Active smoker - onset in upper teenage years, controlled with prn saba, worse p covid 10 May 2019 and Jan 2022  - 09/06/2020  After extensive coaching inhaler device,  effectiveness =    90% so try dulera 100 2bid and gerd rx   DDX of  difficult airways management almost all start with A and  include Adherence, Ace Inhibitors, Acid Reflux, Active Sinus Disease, Alpha 1 Antitripsin deficiency, Anxiety masquerading as Airways dz,  ABPA,  Allergy(esp in young), Aspiration (esp in elderly), Adverse effects of meds,  Active smoking or vaping, A bunch of PE's (a small clot burden can't cause this syndrome unless there is already severe underlying pulm or vascular dz with poor reserve) plus two Bs  = Bronchiectasis and Beta blocker use..and one C= CHF  Adherence is always the initial "prime suspect" and is a multilayered concern that requires a "trust but verify" approach in every patient - starting with knowing how to use medications, especially inhalers, correctly, keeping up with refills and understanding the fundamental difference between maintenance and prns vs those medications only taken for a very short course and then stopped and not refilled.  - see hfa teaching  Active smoking > minimal / social but needs to quit  ? Allergy > try low dose dulera and pred x 6 if not resolved  ? Acid (or non-acid) GERD > always difficult to exclude as up to 75% of pts in some series report no assoc GI/ Heartburn symptoms> rec max (24h)  acid suppression until better and diet restrictions/ reviewed and instructions given in writing.   >>> f/u in 6 weeks

## 2020-09-06 NOTE — Assessment & Plan Note (Signed)
Counseled re importance of complete smoking cessation but did not meet time criteria for separate billing         Each maintenance medication was reviewed in detail including emphasizing most importantly the difference between maintenance and prns and under what circumstances the prns are to be triggered using an action plan format where appropriate.  Total time for H and P, chart review, counseling, reviewing hfa  device(s) and generating customized AVS unique to this office visit / same day charting = 45 min

## 2020-09-06 NOTE — Progress Notes (Signed)
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     History of Present Illness  09/06/2020  Pulmonary/ 1st office eval/ Clayton Bosserman / [place] [facility]  Chief Complaint  Patient presents with  . Consult    Productive cough with clear phlegm since having COVID January 2021  Dyspnea:  Needs inhaler to finish houswork or plan with 35 year old  Cough: thick / clear / watery nasal discharge  Sleep: worse cough maybe an hour p hs on 2 pillows and also 4-5 hours and uses albuterol  SABA use: way too much  Only rx is cough suppression helps some  No obvious day to day or daytime variability or assoc  purulent sputum or mucus plugs or hemoptysis or cp or chest tightness, subjective wheeze or overt sinus or hb symptoms.     Also denies any obvious fluctuation of symptoms with weather or environmental changes or other aggravating or alleviating factors except as outlined above   No unusual exposure hx or h/o childhood pna/ asthma or knowledge of premature birth.  Current Allergies, Complete Past Medical History, Past Surgical History, Family History, and Social History were reviewed in [employer] record.  ROS  The following are not active complaints unless bolded Hoarseness, sore throat, dysphagia, dental problems, itching, sneezing,  nasal congestion or discharge of excess mucus or purulent secretions, ear ache,   fever, chills, sweats, unintended wt loss or wt gain, classically pleuritic or exertional cp,  orthopnea pnd or arm/hand swelling  or leg swelling, presyncope, palpitations, abdominal  pain, anorexia, nausea, vomiting, diarrhea  or change in bowel habits or change in bladder habits, change in stools or change in urine, dysuria, hematuria,  rash, arthralgias, visual complaints, headache, numbness, weakness or ataxia or problems with walking or coordination,  change in mood or  memory.           Past Medical History:  Diagnosis Date  . Abnormal Pap smear 2011  . Anxiety and depression   . Asthma   . Endometriosis   . Migraines   . PONV (postoperative nausea and vomiting)   . PTSD (post-traumatic stress disorder)    "adultified child"    Outpatient Medications Prior to Visit  Medication Sig Dispense Refill  . albuterol (VENTOLIN HFA) 108 (90 Base) MCG/ACT inhaler Inhale 1-2 puffs into the lungs every 6 (six) hours as needed for wheezing or shortness of breath. 18 g 2  . amLODipine (NORVASC) 5 MG tablet Take 1 tablet (5 mg total) by mouth daily. 30 tablet 2  . benzonatate (TESSALON PERLES) 100 MG capsule Take 1 capsule (100 mg total) by mouth 3 (three) times daily as needed for cough. 20 capsule 3  . gabapentin (NEURONTIN) 300 MG capsule Take 1 capsule (300 mg total) by mouth 3 (three) times daily. 90 capsule 5  . hydrOXYzine (ATARAX/VISTARIL) 10 MG tablet Take 1 tablet (10 mg total) by mouth 3 (three) times daily as needed for anxiety. 60 tablet 2  . omeprazole (PRILOSEC) 20 MG capsule TAKE 1 CAPSULE BY MOUTH EVERY DAY 90  capsule 1  . sertraline (ZOLOFT) 100 MG tablet Take 1 tablet (100 mg total) by mouth daily. 90 tablet 1  . SPRINTEC 28 0.25-35 MG-MCG tablet TAKE 1 TABLET BY MOUTH EVERY DAY 84 tablet 3  . tiZANidine (ZANAFLEX) 4 MG tablet TAKE 1 TO 2 TABLETS BY MOUTH EVERY EVENING 60 tablet 5  . diclofenac (VOLTAREN) 75 MG EC tablet Take by mouth.     No facility-administered medications prior to visit.     Objective:     BP 112/72 (BP Location: Left Arm, Cuff Size: Normal)   Pulse 82   Temp 97.9 F (36.6 C) (Temporal)   Ht 5\' 3"  (1.6 m)   Wt 110 lb 6.4 oz  (50.1 kg)   SpO2 100% Comment: Room air  BMI 19.56 kg/m   SpO2: 100 % (Room air)  amb healthy appearing wf nad    HEENT : pt wearing mask not removed for exam due to covid -19 concerns.    NECK :  without JVD/Nodes/TM/ nl carotid upstrokes bilaterally   LUNGS: no acc muscle use,  Nl contour chest which is clear to A and P bilaterally with cough on  exp maneuvers   CV:  RRR  no s3 or murmur or increase in P2, and no edema   ABD:  soft and nontender with nl inspiratory excursion in the supine position. No bruits or organomegaly appreciated, bowel sounds nl  MS:  Nl gait/ ext warm without deformities, calf tenderness, cyanosis or clubbing No obvious joint restrictions   SKIN: warm and dry without lesions    NEURO:  alert, approp, nl sensorium with  no motor or cerebellar deficits apparent.      I personally reviewed images and agree with radiology impression as follows:  CXR:   06/26/20 No radiographic evidence of acute cardiopulmonary disease.      Assessment   No problem-specific Assessment & Plan notes found for this encounter.     08/26/20, MD 09/06/2020

## 2020-09-20 ENCOUNTER — Other Ambulatory Visit: Payer: Self-pay | Admitting: Advanced Practice Midwife

## 2020-10-04 ENCOUNTER — Ambulatory Visit: Payer: Medicaid Other | Admitting: Family Medicine

## 2020-10-18 ENCOUNTER — Ambulatory Visit: Payer: Medicaid Other | Admitting: Internal Medicine

## 2020-10-18 NOTE — Progress Notes (Deleted)
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     History of Present Illness  09/06/2020  Pulmonary/ 1st office eval/ Antionetta Ator / [place] [facility]  Chief Complaint  Patient presents with   Consult    Productive cough with clear phlegm since having COVID January 2021  Dyspnea:  Needs inhaler to finish houswork or plan with 36 year old  Cough: thick / clear / watery nasal discharge  Sleep: worse cough maybe an hour p hs on 2 pillows and also 4-5 hours and uses albuterol  SABA use: way too much  Only rx is cough suppression helps some Rec Plan A = Automatic = Always=   dulera 100 Take 2 puffs first thing in am and then another 2 puffs about 12 hours later.  Work on inhaler technique:  Plan B = Backup (to supplement plan A, not to replace it) Only use your albuterol inhaler as a rescue medication  Try prilosec otc 20mg   Take 30-60 min before first meal of the day and Pepcid ac (famotidine) 20 mg one an hour of bedtime  until cough is completely gone for at least a week without the need for cough suppression GERD  diet   if not doing better >  Prednisone 10 mg take  4 each am x 2 days,   2 each am x 2 days,  1 each am x 2 days and stop  Please schedule a follow up office visit in 6 weeks, call sooner if needed     .10/18/2020  f/u ov/[place] office/Shakala Marlatt re:  No chief complaint on file.   Dyspnea:  *** Cough: *** Sleeping: *** SABA use: *** 02: *** Covid status: *** Lung cancer screening: ***   No obvious day to day or daytime variability or assoc excess/ purulent sputum or mucus plugs or  hemoptysis or cp or chest tightness, subjective wheeze or overt sinus or hb symptoms.   *** without nocturnal  or early am exacerbation  of respiratory  c/o's or need for noct saba. Also denies any obvious fluctuation of symptoms with weather or environmental changes or other aggravating or alleviating factors except as outlined above   No unusual exposure hx or h/o childhood pna/ asthma or knowledge of premature birth.  Current Allergies, Complete Past Medical History, Past Surgical History, Family History, and Social History were reviewed in 10/20/2020 record.  ROS  The following are not active complaints unless bolded Hoarseness, sore throat, dysphagia, dental problems, itching, sneezing,  nasal congestion or discharge of excess mucus or purulent secretions, ear ache,   fever, chills, sweats, unintended wt loss or wt gain, classically pleuritic or exertional cp,  orthopnea pnd or arm/hand swelling  or leg swelling, presyncope, palpitations, abdominal pain, anorexia, nausea, vomiting, diarrhea  or change in bowel habits or change in bladder habits, change in stools or change in urine, dysuria, hematuria,  rash, arthralgias, visual complaints, headache, numbness, weakness or ataxia or problems with walking or coordination,  change in mood or  memory.  No outpatient medications have been marked as taking for the 10/18/20 encounter (Appointment) with Nyoka Cowden, MD.             Past Medical History:  Diagnosis Date   Abnormal Pap smear 2011   Anxiety and depression    Asthma    Endometriosis    Migraines    PONV (postoperative nausea and vomiting)    PTSD (post-traumatic stress disorder)    "adultified child"        Objective:      Wt Readings from Last 3 Encounters:  09/06/20 110 lb 6.4 oz (50.1 kg)  08/22/20 108 lb 9.6 oz (49.3 kg)  05/01/20 105 lb (47.6 kg)      Vital signs reviewed  10/18/2020  - Note at rest 02 sats  ***% on ***    General appearance:    ***          Assessment

## 2020-11-17 ENCOUNTER — Other Ambulatory Visit: Payer: Self-pay | Admitting: Family Medicine

## 2020-11-17 DIAGNOSIS — I7300 Raynaud's syndrome without gangrene: Secondary | ICD-10-CM

## 2020-11-20 ENCOUNTER — Other Ambulatory Visit: Payer: Self-pay | Admitting: Family Medicine

## 2020-11-20 DIAGNOSIS — R059 Cough, unspecified: Secondary | ICD-10-CM

## 2020-11-20 DIAGNOSIS — Z8616 Personal history of COVID-19: Secondary | ICD-10-CM

## 2020-12-12 ENCOUNTER — Other Ambulatory Visit: Payer: Self-pay | Admitting: Family Medicine

## 2020-12-12 DIAGNOSIS — F331 Major depressive disorder, recurrent, moderate: Secondary | ICD-10-CM

## 2020-12-12 DIAGNOSIS — F419 Anxiety disorder, unspecified: Secondary | ICD-10-CM

## 2020-12-30 ENCOUNTER — Other Ambulatory Visit: Payer: Self-pay | Admitting: Internal Medicine

## 2021-01-13 IMAGING — CT CT HEAD WITHOUT CONTRAST
3 series · 16 of 47 positions shown, 19 images · non-contrast
Comparison: CT head 07/16/2012

CLINICAL DATA: Frontal headache. Childbirth [birth year]/2018 with
epidural anesthesia.

EXAM:
CT HEAD WITHOUT CONTRAST
TECHNIQUE: Contiguous axial images were obtained from the base of the skull
through the vertex without intravenous contrast.

[Series 2: head w o · axial · 0.42mm/px · z∈[+1582,+1712]mm · 10 of 32 slices shown, 13 images]
[im 3/32  brain]
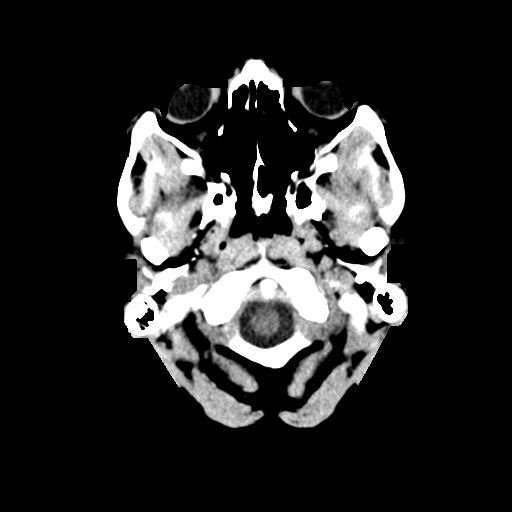
[im 3/32  bone]
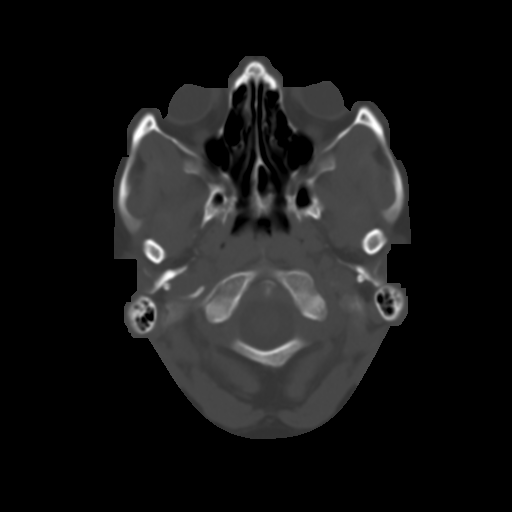
[im 6/32  brain]
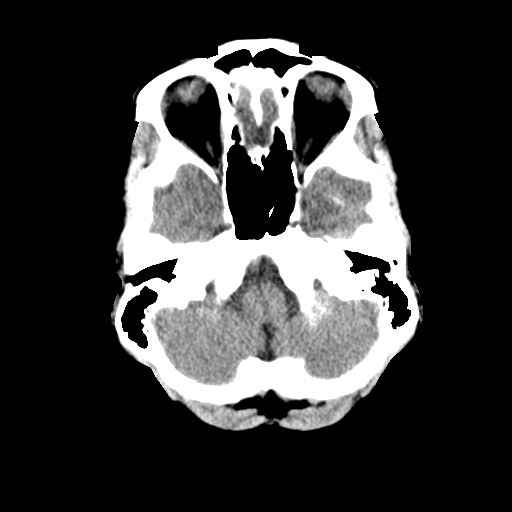
[im 9/32  brain]
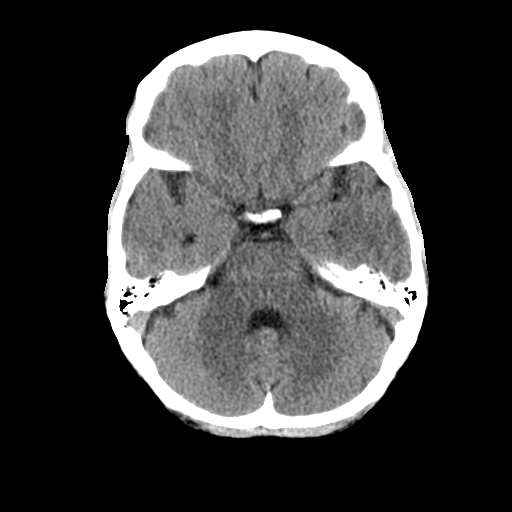
[im 11/32  brain]
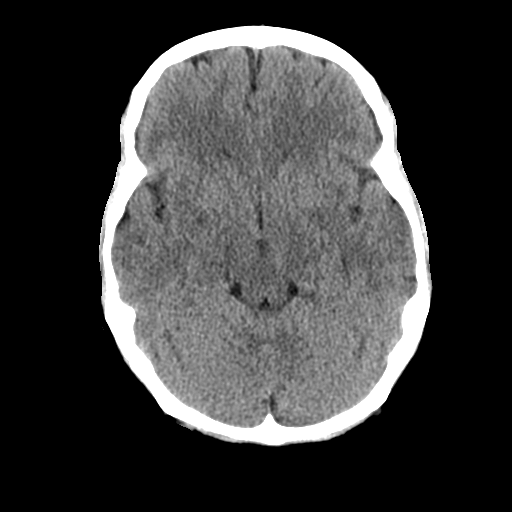
[im 14/32  brain]
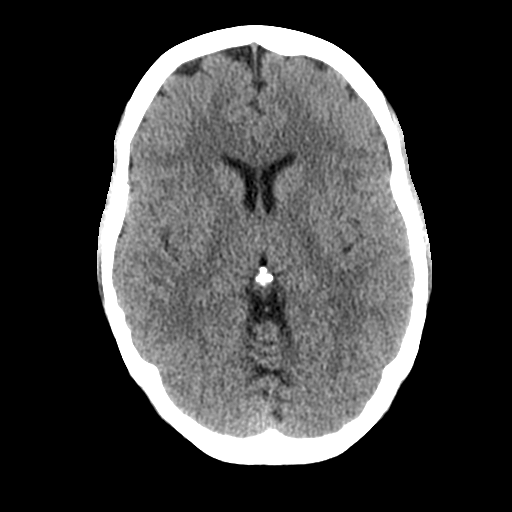
[im 14/32  bone]
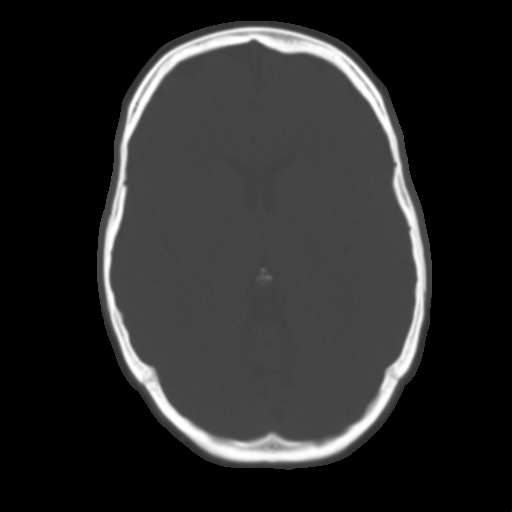
[im 18/32  brain]
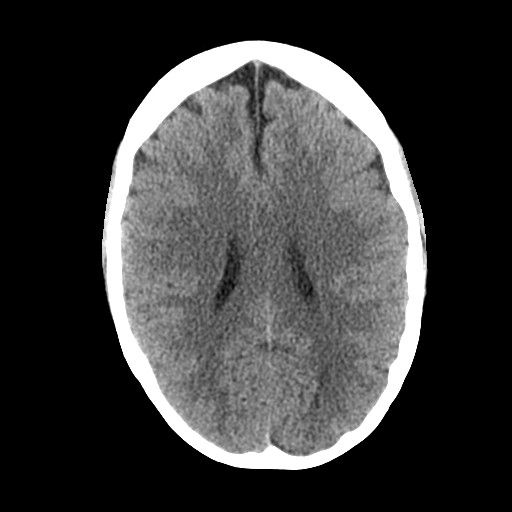
[im 21/32  brain]
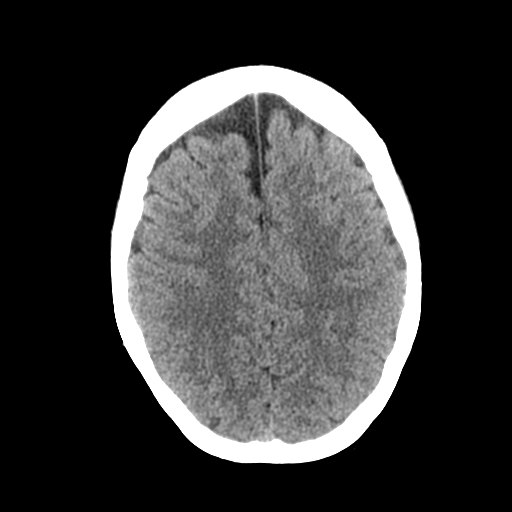
[im 24/32  brain]
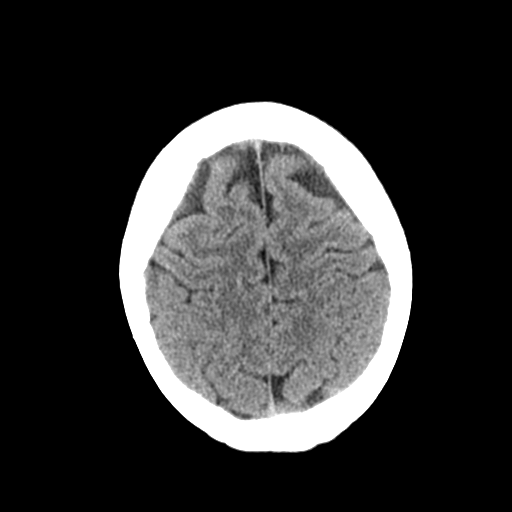
[im 26/32  brain]
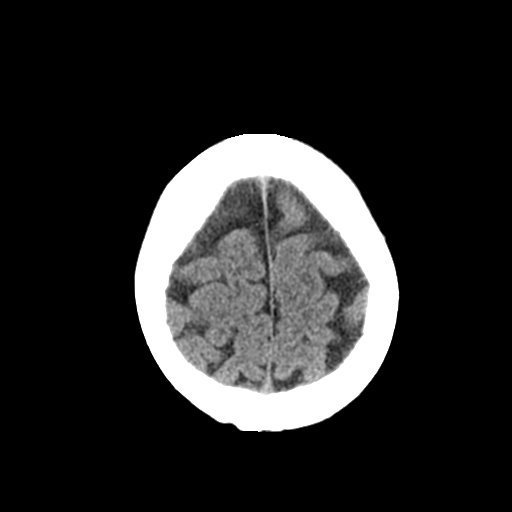
[im 26/32  bone]
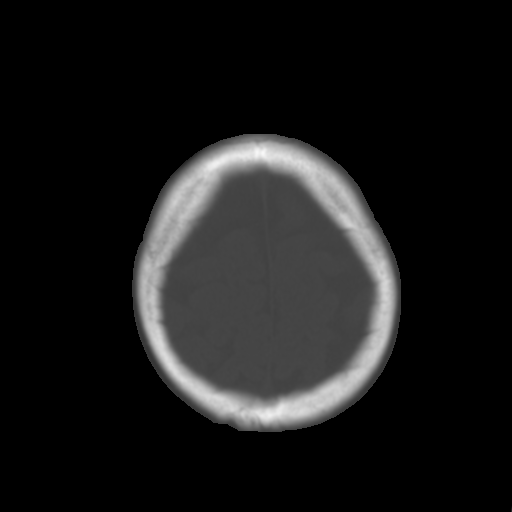
[im 29/32  brain]
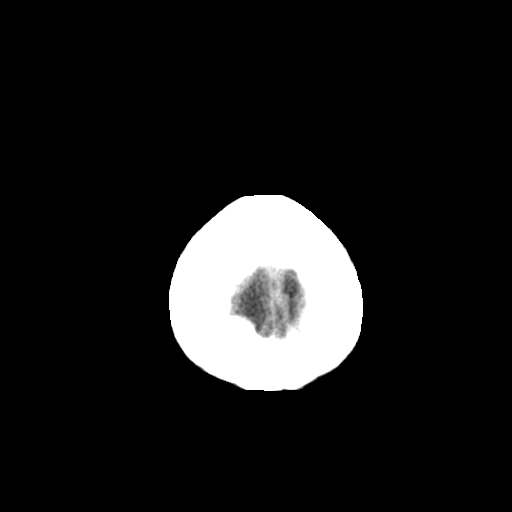

[Series 4: coronal soft · coronal · 0.33mm/px · 3 of 77 slices shown]
[im 26/77  brain]
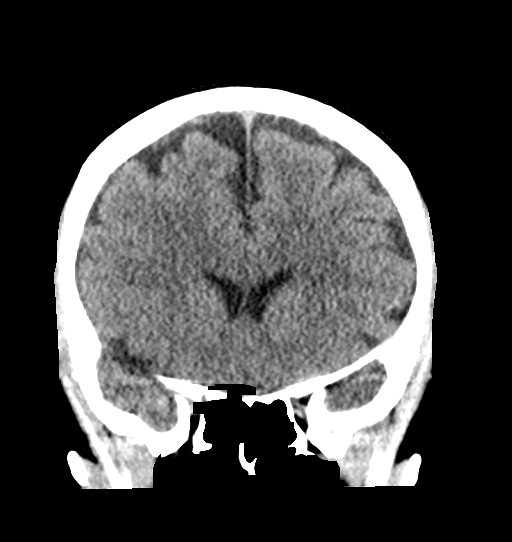
[im 34/77  brain]
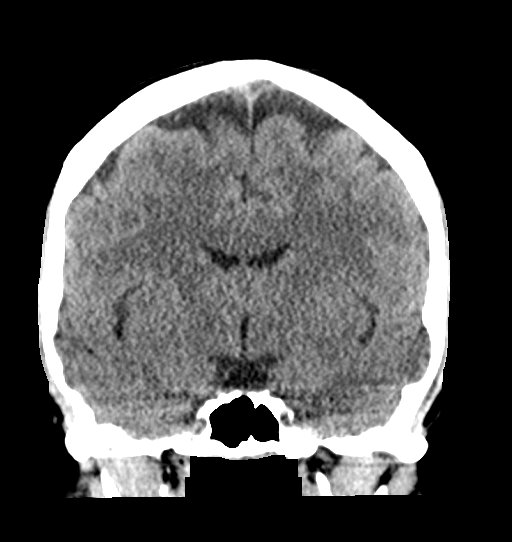
[im 43/77  brain]
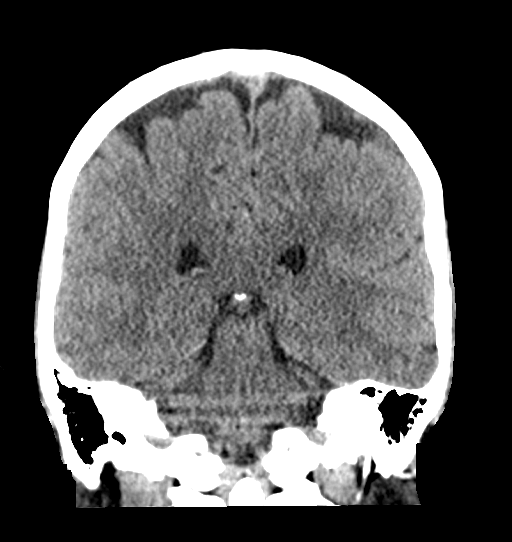

[Series 5: sagittal soft · sagittal · 0.34mm/px · 3 of 54 slices shown]
[im 18/54  brain]
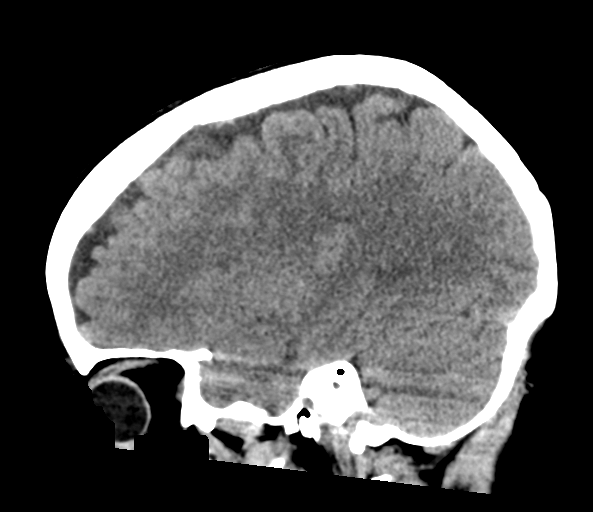
[im 27/54  brain]
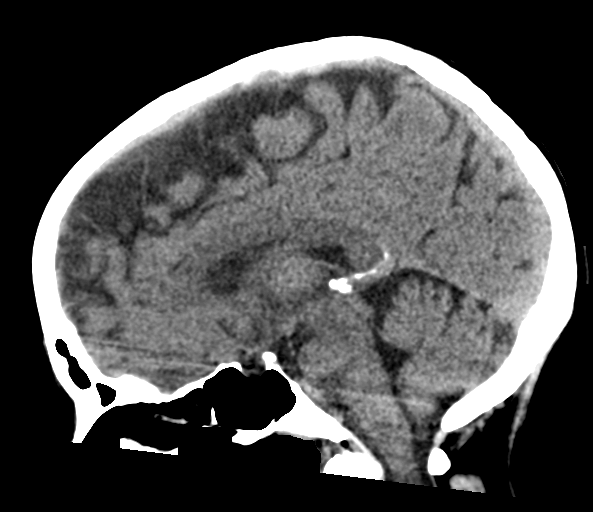
[im 36/54  brain]
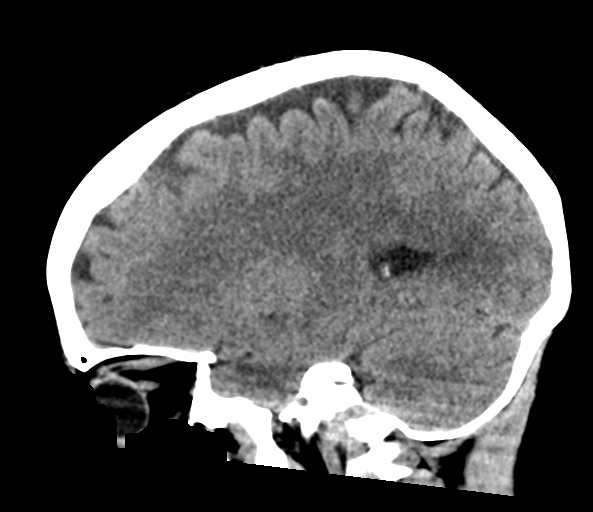

[16 of 47 positions shown; findings below may reference images not displayed]

FINDINGS: Brain: No evidence of acute infarction, hemorrhage, hydrocephalus,
extra-axial collection or mass lesion/mass effect.

Vascular: Negative for hyperdense vessel

Skull: Negative

Sinuses/Orbits: Negative

Other: None
IMPRESSION: Negative CT head

## 2021-02-11 ENCOUNTER — Other Ambulatory Visit: Payer: Self-pay | Admitting: Family Medicine

## 2021-02-11 DIAGNOSIS — I7300 Raynaud's syndrome without gangrene: Secondary | ICD-10-CM

## 2021-02-11 DIAGNOSIS — M797 Fibromyalgia: Secondary | ICD-10-CM

## 2021-02-11 DIAGNOSIS — F331 Major depressive disorder, recurrent, moderate: Secondary | ICD-10-CM

## 2021-02-28 ENCOUNTER — Encounter: Payer: Self-pay | Admitting: Family Medicine

## 2021-03-11 ENCOUNTER — Ambulatory Visit: Payer: Medicaid Other | Admitting: Nurse Practitioner

## 2021-03-11 ENCOUNTER — Other Ambulatory Visit: Payer: Self-pay | Admitting: Family Medicine

## 2021-03-11 ENCOUNTER — Other Ambulatory Visit: Payer: Self-pay

## 2021-03-11 ENCOUNTER — Encounter: Payer: Self-pay | Admitting: Nurse Practitioner

## 2021-03-11 VITALS — BP 104/69 | HR 103 | Temp 97.9°F | Ht 63.0 in | Wt 107.0 lb

## 2021-03-11 DIAGNOSIS — F331 Major depressive disorder, recurrent, moderate: Secondary | ICD-10-CM

## 2021-03-11 DIAGNOSIS — L509 Urticaria, unspecified: Secondary | ICD-10-CM | POA: Diagnosis not present

## 2021-03-11 DIAGNOSIS — L508 Other urticaria: Secondary | ICD-10-CM | POA: Insufficient documentation

## 2021-03-11 DIAGNOSIS — I7300 Raynaud's syndrome without gangrene: Secondary | ICD-10-CM

## 2021-03-11 MED ORDER — HYDROCORTISONE 1 % EX LOTN: 1.0000 "application " | TOPICAL_LOTION | Freq: Two times a day (BID) | CUTANEOUS | 0 refills | Status: DC

## 2021-03-11 MED ORDER — PREDNISONE 10 MG (21) PO TBPK: ORAL_TABLET | ORAL | 0 refills | Status: DC

## 2021-03-11 NOTE — Patient Instructions (Signed)
Hives Hives are itchy, red, swollen areas on your skin. Hives can show up on any part of your body. Hives often fade within 24 hours (acute hives). New hives can show up after old ones fade. This can go on for many days or weeks (chronic hives). Hives do not spread from person to person (are not contagious). Hives are caused by your body's response to something that you are allergic to (allergen). These are sometimes called triggers. You can get hives right after being around a trigger, or hours later. What are the causes? Allergies to foods. Insect bites or stings. Exposure to pollen or pets. Spending time in sunlight, heat, or cold. Exercise. Stress. You can also get hives from other medical conditions and treatments, such as: Some medicines. Chemicals or latex. Viruses. This includes the common cold. Infections caused by germs (bacteria). Allergy shots. Blood transfusions. Sometimes, the cause is not known. What increases the risk? Being a woman. Being allergic to foods such as: Citrus fruits. Milk. Eggs. Peanuts. Tree nuts. Shellfish. Being allergic to: Medicines. Latex. Insects. Animals. Pollen. What are the signs or symptoms?  Raised, itchy, red or white bumps or patches on your skin. These areas may: Get large and swollen. Change in shape and location. Stand alone or connect to each other over a large area of skin. Sting or hurt. Turn white when pressed in the center (blanch). In very bad cases, your hands, feet, and face may also get swollen. This may happen if hives start deeper in your skin. How is this treated? Treatment for this condition depends on your symptoms. Treatment may include: Using cool, wet cloths (cool compresses) or taking cool showers to stop the itching. Medicines that help: Relieve itching (antihistamines). Reduce swelling (corticosteroids). Treat infection (antibiotics). A medicine (omalizumab) that is given as a shot (injection). Your  doctor may prescribe this if you have hives that do not get better even after other treatments. In very bad cases, you may need a shot of a medicine called epinephrine to prevent a life-threatening allergic reaction (anaphylaxis). Follow these instructions at home: Medicines Take or apply over-the-counter and prescription medicines only as told by your doctor. If you were prescribed an antibiotic medicine, use it as told by your doctor. Do not stop using it even if you start to feel better. Skin care Apply cool, wet cloths to the hives. Do not scratch your skin. Do not rub your skin. General instructions Do not take hot showers or baths. This can make itching worse. Do not wear tight clothes. Use sunscreen and wear clothes that cover your skin when you are outside. Avoid any triggers that cause your hives. Keep a journal to help track what causes your hives. Write down: What medicines you take. What you eat and drink. What products you use on your skin. Keep all follow-up visits as told by your doctor. This is important. Contact a doctor if: Your symptoms are not better with medicine. Your joints hurt or are swollen. Get help right away if: You have a fever. You have pain in your belly (abdomen). Your tongue or lips are swollen. Your eyelids are swollen. Your chest or throat feels tight. You have trouble breathing or swallowing. These symptoms may be an emergency. Do not wait to see if the symptoms will go away. Get medical help right away. Call your local emergency services (911 in the [place] Do not drive yourself to the hospital. Summary Hives are itchy, red, swollen areas on your skin. Treatment   for this condition depends on your symptoms. Avoid things that cause your hives. Keep a journal to help track what causes your hives. Take and apply over-the-counter and prescription medicines only as told by your doctor. Get help right away if your chest or throat feels tight or if you  have trouble breathing or swallowing. This information is not intended to replace advice given to you by your health care provider. Make sure you discuss any questions you have with your health care provider. Document Revised: 05/27/2020 Document Reviewed: 05/27/2020 Elsevier Patient Education  2022 [employer]  

## 2021-03-11 NOTE — Assessment & Plan Note (Signed)
Hives symptoms not well controlled.  Symptoms presents as an autoimmune reaction.  Advised patient to seek out allergy testing in the future.  This is a chronic problem and nothing in the past has helped resolve symptoms.  Depo-Medrol 80 mg shot given in clinic, prednisone taper.  Hydrocortisone 1% topical cream, aloe vera gel as needed, avoid hot showers, keep skin moisturized and frequent oatmeal baths.  Patient verbalized understanding, Rx sent to pharmacy.  Follow-up with unresolved symptoms
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
## 2021-03-20 ENCOUNTER — Ambulatory Visit: Payer: Medicaid Other | Admitting: Family Medicine

## 2021-03-20 ENCOUNTER — Encounter: Payer: Self-pay | Admitting: Family Medicine

## 2021-03-20 VITALS — BP 104/70 | HR 86 | Temp 98.1°F | Ht 63.0 in | Wt 109.4 lb

## 2021-03-20 DIAGNOSIS — F419 Anxiety disorder, unspecified: Secondary | ICD-10-CM | POA: Diagnosis not present

## 2021-03-20 DIAGNOSIS — L508 Other urticaria: Secondary | ICD-10-CM

## 2021-03-20 DIAGNOSIS — F331 Major depressive disorder, recurrent, moderate: Secondary | ICD-10-CM

## 2021-03-20 MED ORDER — LEVOCETIRIZINE DIHYDROCHLORIDE 5 MG PO TABS: 5.0000 mg | ORAL_TABLET | Freq: Every evening | ORAL | 1 refills | Status: DC

## 2021-03-20 MED ORDER — HYDROXYZINE HCL 10 MG PO TABS: 10.0000 mg | ORAL_TABLET | Freq: Three times a day (TID) | ORAL | 2 refills | Status: DC | PRN

## 2021-03-20 NOTE — Progress Notes (Signed)
Assessment & Plan:  1. Chronic urticaria Uncontrolled. Patient to start Xyzal daily. Discussed she can take her Atarax that she has for anxiety for her itching as well. Referring to allergy specialist for testing and further treatment. Education provided on hives. - levocetirizine (XYZAL) 5 MG tablet; Take 1 tablet (5 mg total) by mouth every evening.  Dispense: 90 tablet; Refill: 1 - Ambulatory referral to Allergy   Follow up plan: Return in about 3 months (around 06/18/2021) for annual physical.  [name], MSN, APRN, FNP-C [facility]  Subjective:   Patient ID: Pamela Gallagher, female    DOB: 26-Jan-1986, 35 y.o.   MRN: [id number]  HPI: Pamela Gallagher is a 35 y.o. female presenting on 03/20/2021 for Urticaria (Follow up from being seen on 03/11/21- patient states she is no better )  Patient is following up on her hives. She was seen 9 days ago and treated with a prednisone taper and hydrocortisone lotion. She states she is no better. She reports intermittent hives that can occur anywhere on her body. This has been occurring since she was 35 years of age (13 years). She has not been able to identify her triggers. Heat makes it worse. She has to remove all her clothes when it happens as well so they don't rub and make it worse. She does not take any medications for the hives. Hives are not currently present.   ROS: Negative unless specifically indicated above in HPI.   Relevant past medical history reviewed and updated as indicated.   Allergies and medications reviewed and updated.   Current Outpatient Medications:    albuterol (VENTOLIN HFA) 108 (90 Base) MCG/ACT inhaler, Inhale 1-2 puffs into the lungs every 6 (six) hours as needed for wheezing or shortness of breath., Disp: 18 g, Rfl: 2   amLODipine (NORVASC) 5 MG tablet, TAKE 1 TABLET (5 MG TOTAL) BY MOUTH DAILY. (NEEDS TO BE SEEN BEFORE NEXT REFILL), Disp: 30 tablet, Rfl: 0   famotidine (PEPCID) 20 MG  tablet, One after supper, Disp: 30 tablet, Rfl: 11   gabapentin (NEURONTIN) 300 MG capsule, Take 1 capsule (300 mg total) by mouth 3 (three) times daily., Disp: 90 capsule, Rfl: 5   hydrocortisone 1 % lotion, Apply 1 application topically 2 (two) times daily., Disp: 118 mL, Rfl: 0   hydrOXYzine (ATARAX/VISTARIL) 10 MG tablet, Take 1 tablet (10 mg total) by mouth 3 (three) times daily as needed for anxiety. (NEEDS TO BE SEEN BEFORE NEXT REFILL), Disp: 60 tablet, Rfl: 0   mometasone-formoterol (DULERA) 100-5 MCG/ACT AERO, Take 2 puffs first thing in am and then another 2 puffs about 12 hours later., Disp: 1 each, Rfl: 2   norgestimate-ethinyl estradiol (ORTHO-CYCLEN) 0.25-35 MG-MCG tablet, TAKE 1 TABLET BY MOUTH EVERY DAY, Disp: 84 tablet, Rfl: 3   omeprazole (PRILOSEC) 20 MG capsule, TAKE 1 CAPSULE 30-60 MINUTES BEFORE FIRST MEAL OF THE DAY, Disp: 90 capsule, Rfl: 0   sertraline (ZOLOFT) 100 MG tablet, TAKE 1 TABLET (100 MG TOTAL) BY MOUTH DAILY. (NEEDS TO BE SEEN BEFORE NEXT REFILL), Disp: 30 tablet, Rfl: 0   tiZANidine (ZANAFLEX) 4 MG tablet, TAKE 1 TO 2 TABLETS BY MOUTH EVERY EVENING, Disp: 180 tablet, Rfl: 1  Allergies  Allergen Reactions   Clindamycin/Lincomycin Shortness Of Breath   Penicillins Anaphylaxis    Has patient had a PCN reaction causing immediate rash, facial/tongue/throat swelling, SOB or lightheadedness with hypotension: Yes Has patient had a PCN reaction causing severe rash involving  mucus membranes or skin necrosis: Yes Has patient had a PCN reaction that required hospitalization Yes Has patient had a PCN reaction occurring within the last 10 years: No If all of the above answers are "NO", then may proceed with Cephalosporin use.    Sulfa Antibiotics Anaphylaxis    Objective:   BP 104/70   Pulse 86   Temp 98.1 F (36.7 C) (Temporal)   Ht 5\' 3"  (1.6 m)   Wt 109 lb 6.4 oz (49.6 kg)   LMP 03/11/2021 (Exact Date)   BMI 19.38 kg/m    Physical Exam Vitals reviewed.   Constitutional:      General: She is not in acute distress.    Appearance: Normal appearance. She is not ill-appearing, toxic-appearing or diaphoretic.  HENT:     Head: Normocephalic and atraumatic.  Eyes:     General: No scleral icterus.       Right eye: No discharge.        Left eye: No discharge.     Conjunctiva/sclera: Conjunctivae normal.  Cardiovascular:     Rate and Rhythm: Normal rate.  Pulmonary:     Effort: Pulmonary effort is normal. No respiratory distress.  Musculoskeletal:        General: Normal range of motion.     Cervical back: Normal range of motion.  Skin:    General: Skin is warm and dry.     Capillary Refill: Capillary refill takes less than 2 seconds.  Neurological:     General: No focal deficit present.     Mental Status: She is alert and oriented to person, place, and time. Mental status is at baseline.  Psychiatric:        Mood and Affect: Mood normal.        Behavior: Behavior normal.        Thought Content: Thought content normal.        Judgment: Judgment normal.

## 2021-03-25 ENCOUNTER — Encounter: Payer: Self-pay | Admitting: Family Medicine

## 2021-04-06 ENCOUNTER — Other Ambulatory Visit: Payer: Self-pay | Admitting: Family Medicine

## 2021-04-06 DIAGNOSIS — I7300 Raynaud's syndrome without gangrene: Secondary | ICD-10-CM

## 2021-04-06 DIAGNOSIS — F331 Major depressive disorder, recurrent, moderate: Secondary | ICD-10-CM

## 2021-04-17 ENCOUNTER — Telehealth: Payer: Self-pay | Admitting: Family Medicine

## 2021-04-17 NOTE — Telephone Encounter (Signed)
Pt called to schedule an appt with [name] because she is having issues with her medication and may need to change it. Says she has a new job so she needs latest appt time (4:05).  Please call pt anytime after 4PM to schedule appt.

## 2021-04-17 NOTE — Telephone Encounter (Signed)
Appt amde for soonest at 11.05 which patient requested she will call back and change if she gets worse.

## 2021-05-03 ENCOUNTER — Other Ambulatory Visit: Payer: Medicaid Other | Admitting: Adult Health

## 2021-05-08 ENCOUNTER — Encounter: Payer: Self-pay | Admitting: Family Medicine

## 2021-05-08 ENCOUNTER — Ambulatory Visit (INDEPENDENT_AMBULATORY_CARE_PROVIDER_SITE_OTHER): Payer: Medicaid Other | Admitting: Family Medicine

## 2021-05-08 DIAGNOSIS — F419 Anxiety disorder, unspecified: Secondary | ICD-10-CM

## 2021-05-08 DIAGNOSIS — R112 Nausea with vomiting, unspecified: Secondary | ICD-10-CM

## 2021-05-08 DIAGNOSIS — F331 Major depressive disorder, recurrent, moderate: Secondary | ICD-10-CM

## 2021-05-08 DIAGNOSIS — F4323 Adjustment disorder with mixed anxiety and depressed mood: Secondary | ICD-10-CM | POA: Diagnosis not present

## 2021-05-08 MED ORDER — BUSPIRONE HCL 7.5 MG PO TABS: 7.5000 mg | ORAL_TABLET | Freq: Two times a day (BID) | ORAL | 2 refills | Status: DC

## 2021-05-08 MED ORDER — SERTRALINE HCL 100 MG PO TABS: 150.0000 mg | ORAL_TABLET | Freq: Every day | ORAL | 1 refills | Status: DC

## 2021-05-08 MED ORDER — ONDANSETRON 4 MG PO TBDP: 4.0000 mg | ORAL_TABLET | Freq: Three times a day (TID) | ORAL | 1 refills | Status: DC | PRN

## 2021-05-08 NOTE — Progress Notes (Signed)
Virtual Visit via Telephone Note  I connected with Pamela Gallagher on 05/08/21 at 10:53 AM by telephone and verified that I am speaking with the correct person using two identifiers. Pamela Gallagher is currently located at home and nobody is currently with her during this visit. The provider, Pamela FudgeBRITNEY F Lehman Whiteley, FNP is located in their office at time of visit.  I discussed the limitations, risks, security and privacy concerns of performing an evaluation and management service by telephone and the availability of in person appointments. I also discussed with the patient that there may be a patient responsible charge related to this service. The patient expressed understanding and agreed to proceed.  Subjective: PCP: Pamela Gallagher, Pamela Gallagher F, FNP  No chief complaint on file.  Patient is having a very hard time mentally. Her significant other has been sent to fail for 10+ years. They have a 352.37 year old daughter who keeps asking where he is. 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 She has since started a job at [employer] one month ago and confirms that she will be able to pay her bills. She also has a friend that is going to move in with her and split the bills while she gets up on her feet. She is currently taking Zoloft 100 mg which was previously effective for her. Currently she is experiencing worsening hives, shaking, vomiting, and diarrhea due to increased stress. She has been taking Imodium-AD for the diarrhea and hydroxyzine for the hives. She has not been to work in two days due to her symptoms and current mental state.  [facility]/08[facility]2021 03/20/2021 03/11/2021  Decreased Interest 3 1 1   Down, Depressed, Hopeless 3 1 1   PHQ - 2 Score 6 2 2   Altered sleeping 3 2 2   Tired, decreased energy 3 1 1   Change in appetite 3 1 1   Feeling bad or failure about yourself  1 1 1   Trouble concentrating 1 1 1   Moving slowly or fidgety/restless 1 0 0   Suicidal thoughts 0 0 0  PHQ-9 Score 18 8 8   Difficult doing work/chores Very difficult Very difficult Somewhat difficult   GAD 7 : Generalized Anxiety Score 05/08/2021 03/20/2021 03/11/2021 08/22/2020  Nervous, Anxious, on Edge 3 2 1 2   Control/stop worrying 3 1 1 1   Worry too much - different things 2 1 2 1   Trouble relaxing 3 2 2 1   Restless 1 1 1 1   Easily annoyed or irritable 3 1 2 1   Afraid - awful might happen 3 0 - 0  Total GAD 7 Score 18 8 - 7  Anxiety Difficulty Very difficult Very difficult Very difficult Somewhat difficult    ROS: Per HPI  Current Outpatient Medications:    albuterol (VENTOLIN HFA) 108 (90 Base) MCG/ACT inhaler, Inhale 1-2 puffs into the lungs every 6 (six) hours as needed for wheezing or shortness of breath., Disp: 18 g, Rfl: 2   amLODipine (NORVASC) 5 MG tablet, Take 1 tablet (5 mg total) by mouth daily., Disp: 90 tablet, Rfl: 1   famotidine (PEPCID) 20 MG tablet, One after supper, Disp: 30 tablet, Rfl: 11   gabapentin (NEURONTIN) 300 MG capsule, Take 1 capsule (300 mg total) by mouth 3 (three) times daily., Disp: 90 capsule, Rfl: 5   hydrocortisone 1 % lotion, Apply 1 application topically 2 (two) times daily., Disp: 118 mL, Rfl: 0   hydrOXYzine (ATARAX) 10 MG tablet,  Take 1 tablet (10 mg total) by mouth 3 (three) times daily as needed for anxiety or itching., Disp: 60 tablet, Rfl: 2   levocetirizine (XYZAL) 5 MG tablet, Take 1 tablet (5 mg total) by mouth every evening., Disp: 90 tablet, Rfl: 1   mometasone-formoterol (DULERA) 100-5 MCG/ACT AERO, Take 2 puffs first thing in am and then another 2 puffs about 12 hours later., Disp: 1 each, Rfl: 2   norgestimate-ethinyl estradiol (ORTHO-CYCLEN) 0.25-35 MG-MCG tablet, TAKE 1 TABLET BY MOUTH EVERY DAY, Disp: 84 tablet, Rfl: 3   omeprazole (PRILOSEC) 20 MG capsule, TAKE 1 CAPSULE 30-60 MINUTES BEFORE FIRST MEAL OF THE DAY, Disp: 90 capsule, Rfl: 0   sertraline (ZOLOFT) 100 MG tablet, Take 1 tablet (100 mg total)  by mouth daily., Disp: 90 tablet, Rfl: 1   tiZANidine (ZANAFLEX) 4 MG tablet, TAKE 1 TO 2 TABLETS BY MOUTH EVERY EVENING, Disp: 180 tablet, Rfl: 1  Allergies  Allergen Reactions   Clindamycin/Lincomycin Shortness Of Breath   Penicillins Anaphylaxis    Has patient had a PCN reaction causing immediate rash, facial/tongue/throat swelling, SOB or lightheadedness with hypotension: Yes Has patient had a PCN reaction causing severe rash involving mucus membranes or skin necrosis: Yes Has patient had a PCN reaction that required hospitalization Yes Has patient had a PCN reaction occurring within the last 10 years: No If all of the above answers are "NO", then may proceed with Cephalosporin use.    Sulfa Antibiotics Anaphylaxis   Past Medical History:  Diagnosis Date   Abnormal Pap smear 2011   Anxiety and depression    Asthma    Endometriosis    Migraines    PONV (postoperative nausea and vomiting)    PTSD (post-traumatic stress disorder)    "adultified child"    Observations/Objective: A&O  No respiratory distress or wheezing audible over the phone Judgement and thought processes all WNL. Tearful.  Assessment and Plan: 1. Recurrent moderate major depressive disorder with anxiety (HCC) Uncontrolled. Increased Zoloft from 100 mg to 150 mg daily. - sertraline (ZOLOFT) 100 MG tablet; Take 1.5 tablets (150 mg total) by mouth daily.  Dispense: 135 tablet; Refill: 1  2. Situational mixed anxiety and depressive disorder Uncontrolled. Started BuSpar twice daily. Referring for counseling. - Ambulatory referral to Psychiatry - busPIRone (BUSPAR) 7.5 MG tablet; Take 1 tablet (7.5 mg total) by mouth 2 (two) times daily.  Dispense: 60 tablet; Refill: 2  3. Nausea and vomiting in adult - ondansetron (ZOFRAN-ODT) 4 MG disintegrating tablet; Take 1 tablet (4 mg total) by mouth every 8 (eight) hours as needed for nausea or vomiting.  Dispense: 30 tablet; Refill: 1   Follow Up  Instructions: Return in about 6 weeks (around 06/19/2021) for Anxiety/Depression.  I discussed the assessment and treatment plan with the patient. The patient was provided an opportunity to ask questions and all were answered. The patient agreed with the plan and demonstrated an understanding of the instructions.   The patient was advised to call back or seek an in-person evaluation if the symptoms worsen or if the condition fails to improve as anticipated.  The above assessment and management plan was discussed with the patient. The patient verbalized understanding of and has agreed to the management plan. Patient is aware to call the clinic if symptoms persist or worsen. Patient is aware when to return to the clinic for a follow-up visit. Patient educated on when it is appropriate to go to the emergency department.   Time call ended: 11:05  AM  I provided 12 minutes of non-face-to-face time during this encounter.  [name], MSN, APRN, FNP-C [facility] 05/08/21

## 2021-05-27 ENCOUNTER — Ambulatory Visit: Payer: Medicaid Other | Admitting: Allergy & Immunology

## 2021-05-27 ENCOUNTER — Encounter: Payer: Self-pay | Admitting: Allergy & Immunology

## 2021-05-27 ENCOUNTER — Other Ambulatory Visit: Payer: Self-pay

## 2021-05-27 VITALS — BP 88/72 | HR 105 | Temp 98.4°F | Resp 16 | Ht 63.0 in | Wt 106.6 lb

## 2021-05-27 DIAGNOSIS — F419 Anxiety disorder, unspecified: Secondary | ICD-10-CM

## 2021-05-27 DIAGNOSIS — J3089 Other allergic rhinitis: Secondary | ICD-10-CM

## 2021-05-27 DIAGNOSIS — L508 Other urticaria: Secondary | ICD-10-CM | POA: Diagnosis not present

## 2021-05-27 DIAGNOSIS — F331 Major depressive disorder, recurrent, moderate: Secondary | ICD-10-CM

## 2021-05-27 DIAGNOSIS — J4540 Moderate persistent asthma, uncomplicated: Secondary | ICD-10-CM | POA: Diagnosis not present

## 2021-05-27 MED ORDER — LEVOCETIRIZINE DIHYDROCHLORIDE 5 MG PO TABS: 5.0000 mg | ORAL_TABLET | Freq: Two times a day (BID) | ORAL | 1 refills | Status: DC | PRN

## 2021-05-27 MED ORDER — HYDROXYZINE HCL 10 MG PO TABS: 10.0000 mg | ORAL_TABLET | Freq: Every evening | ORAL | 1 refills | Status: DC

## 2021-05-27 MED ORDER — FAMOTIDINE 20 MG PO TABS: 20.0000 mg | ORAL_TABLET | Freq: Two times a day (BID) | ORAL | 5 refills | Status: DC

## 2021-05-27 MED ORDER — CARBINOXAMINE MALEATE 4 MG/5ML PO SOLN: 20.0000 mL | Freq: Every evening | ORAL | 5 refills | Status: DC

## 2021-05-27 MED ORDER — BREZTRI AEROSPHERE 160-9-4.8 MCG/ACT IN AERO: 2.0000 | INHALATION_SPRAY | Freq: Two times a day (BID) | RESPIRATORY_TRACT | 5 refills | Status: DC

## 2021-05-27 NOTE — Progress Notes (Signed)
NEW PATIENT  Date of Service/Encounter:  05/27/21  Consult requested by: Loman Brooklyn, FNP   Assessment:   Moderate persistent asthma, uncomplicated   Chronic urticaria - with negative testing to the most common foods  Perennial allergic rhinitis (dog)  Current smoker (half a pack per day)  Plan/Recommendations:   1. Moderate persistent asthma, uncomplicated - Lung testing looked slightly low and it did not change much with the Xopenex (inhaler) treatment.  - We are going to change you from [facility] to [place] instead (this contains three medications that can help with your breathing). - I do not think that we need prednisone at this point in time.  - Spacer use reviewed. - Daily controller medication(s):  Breztri two puffs twice daily with spacer - Prior to physical activity: albuterol 2 puffs 10-15 minutes before physical activity. - Rescue medications: albuterol 4 puffs every 4-6 hours as needed - Asthma control goals:  * Full participation in all desired activities (may need albuterol before activity) * Albuterol use two time or less a week on average (not counting use with activity) * Cough interfering with sleep two time or less a month * Oral steroids no more than once a year * No hospitalizations  2. Chronic urticaria - Your history does not have any "red flags" such as fevers, joint pains, or permanent skin changes that would be concerning for a more serious cause of hives.  - We will get some labs to rule out serious causes of hives: alpha gal panel, complete blood count, tryptase level, chronic urticaria panel, CMP, ESR, and CRP. - Chronic hives are often times a self limited process and will "burn themselves out" over 6-12 months, although this is not always the case.  - In the meantime, start suppressive dosing of antihistamines:   - Morning: Xyzal (levocetirizine) 67m (two tablets) + Pepcid (famotidine) 249m - Evening: Karbinal ER 20 mL  + hydroxyzine  1075mone tablet) + Pepcid (famotidine) 55m59mConsent signed for Xolair, sop we are going to work on getting this approved (even after gettOccupational psychologistis will still cover Xolair without a problem).  - Tammy, our Biologics Coordinator, will call you to  discuss the approval process.   3. Perennial allergic rhinitis (dog) - Testing today showed: dog - Copy of test results provided.  - Avoidance measures provided. - We are going to avoid the nasal sprays since you are not a fan of those.  - Start taking: Karbinal ER 20 mL at night (this can cause sleepiness, so you can decrease the dose if needed) - You can use an extra dose of the antihistamine, if needed, for breakthrough symptoms.  - Consider nasal saline rinses 1-2 times daily to remove allergens from the nasal cavities as well as help with mucous clearance (this is especially helpful to do before the nasal sprays are given)  4. Return in about 4 weeks (around 06/24/2021).   This note in its entirety was forwarded to the Provider who requested this consultation.  Subjective:   Pamela ASCENCIOa 36 y75. female presenting today for evaluation of  Chief Complaint  Patient presents with   Urticaria    Says she is having hives since she was twen48 years old. Says they are painful, and has pictures. Most recent flare up was Friday and Sunday.    Asthma    Says she is content and uses her albuterol once or twice a month. And [facility].     Pamela Gallagher has a history of the following: Patient Active Problem List   Diagnosis Date Noted   Full body hives 03/11/2021   Cough variant asthma 10/25/2018   Endometriosis 05/12/2018   Marijuana use 05/10/2018   Cigarette smoker 05/04/2018   Panic attacks 09/23/2017   Recurrent moderate major depressive disorder with anxiety ([place]) 04/22/2017   Depression 04/24/2011    History obtained from: chart review and patient.  Pamela Gallagher was referred by Loman Brooklyn, FNP.     Pamela Gallagher is a 36 y.o. female presenting for an evaluation of hives and other atopic conditions .   Asthma/Respiratory Symptom History: She was diagnosed with asthma around her teens.  She has asthma but has exacerbated this.  She claims from her teen years.  33 until she had her daughter.  And after her daughter was born, she started up again and has not stopped.  She smokes around half a pack per day.  She is currently on Dulera 2 puffs twice daily as well as albuterol.  She has always had trouble breathing and today spent some time complaining about the mask she has to wear. She has not needed prednisone for her breathing.  She reports that her postnasal drip does make things worse.  She has a lot of throat clearing with clear mucus throughout the entire year.  Allergic Rhinitis Symptom History: She has had allergy testing in the past, at our [place] office from what it sounds like.  This was in her mid 67s.  She thinks she was sensitized to pads as well as grass.  He is not sure while she was positive to.  They did not do anything else to address her symptoms.  She has been on no spray, but she does not use them on a routine basis. She does not like the sprays and really does not want to talk about them.  She has Xyzal that she uses every day and hydroxyzine at night for her anxiety.  Skin Symptom History: She reports that she has had hives since the age of 39.  She takes Xyzal every day as well as hydroxyzine.  The hives come and go and only last for a few hours.  They occur daily, although mostly at night.  She only gets relief when she is in cold air.  She has not had any joint pain or fevers with this, although she was recently diagnosed with Raynaud's phenomenon.  She has been partially worked up by an Doctor, general practice and was referred to rheumatology, but for 1 reason or another referral number went through.  She has never been on Xolair for her symptoms.  Per a result note in  May 2022, she had rheumatology labs which were all normal at that time.   She works at a [employer] and reports that the [employer] that was used to clean the guns makes her symptoms overall worse.  Otherwise, there is no history of other atopic diseases, including drug allergies, stinging insect allergies, eczema, or contact dermatitis. There is no significant infectious history. Vaccinations are up to date.    Past Medical History: Patient Active Problem List   Diagnosis Date Noted   Full body hives 03/11/2021   Cough variant asthma 10/25/2018   Endometriosis 05/12/2018   Marijuana use 05/10/2018   Cigarette smoker 05/04/2018   Panic attacks 09/23/2017   Recurrent moderate major depressive disorder with anxiety ([place]) 04/22/2017   Depression  04/24/2011    Medication List:  Allergies as of 05/27/2021       Reactions   Clindamycin/lincomycin Shortness Of Breath   Penicillins Anaphylaxis   Has patient had a PCN reaction causing immediate rash, facial/tongue/throat swelling, SOB or lightheadedness with hypotension: Yes Has patient had a PCN reaction causing severe rash involving mucus membranes or skin necrosis: Yes Has patient had a PCN reaction that required hospitalization Yes Has patient had a PCN reaction occurring within the last 10 years: No If all of the above answers are "NO", then may proceed with Cephalosporin use.   Sulfa Antibiotics Anaphylaxis        Medication List        Accurate as of May 27, 2021  4:27 PM. If you have any questions, ask your nurse or doctor.          STOP taking these medications    norgestimate-ethinyl estradiol 0.25-35 MG-MCG tablet Commonly known as: ORTHO-CYCLEN Stopped by: Valentina Shaggy, MD       TAKE these medications    albuterol 108 (90 Base) MCG/ACT inhaler Commonly known as: VENTOLIN HFA Inhale 1-2 puffs into the lungs every 6 (six) hours as needed for wheezing or shortness of breath.   amLODipine 5 MG  tablet Commonly known as: NORVASC Take 1 tablet (5 mg total) by mouth daily.   busPIRone 7.5 MG tablet Commonly known as: BUSPAR Take 1 tablet (7.5 mg total) by mouth 2 (two) times daily.   famotidine 20 MG tablet Commonly known as: Pepcid One after supper   gabapentin 300 MG capsule Commonly known as: NEURONTIN Take 1 capsule (300 mg total) by mouth 3 (three) times daily.   hydrocortisone 1 % lotion Apply 1 application topically 2 (two) times daily.   hydrOXYzine 10 MG tablet Commonly known as: ATARAX Take 1 tablet (10 mg total) by mouth 3 (three) times daily as needed for anxiety or itching.   levocetirizine 5 MG tablet Commonly known as: XYZAL Take 1 tablet (5 mg total) by mouth every evening.   mometasone-formoterol 100-5 MCG/ACT Aero Commonly known as: DULERA Take 2 puffs first thing in am and then another 2 puffs about 12 hours later.   omeprazole 20 MG capsule Commonly known as: PRILOSEC TAKE 1 CAPSULE 30-60 MINUTES BEFORE FIRST MEAL OF THE DAY   ondansetron 4 MG disintegrating tablet Commonly known as: ZOFRAN-ODT Take 1 tablet (4 mg total) by mouth every 8 (eight) hours as needed for nausea or vomiting.   sertraline 100 MG tablet Commonly known as: ZOLOFT Take 1.5 tablets (150 mg total) by mouth daily.   tiZANidine 4 MG tablet Commonly known as: ZANAFLEX TAKE 1 TO 2 TABLETS BY MOUTH EVERY EVENING        Birth History: non-contributory  Developmental History: non-contributory  Past Surgical History: Past Surgical History:  Procedure Laterality Date   BIOPSY  11/27/2014   Procedure: BIOPSY of endometriosis;  Surgeon: Everett Graff, MD;  Location: [facility];  Service: Gynecology;;   CHROMOPERTUBATION  11/27/2014   Procedure: CHROMOPERTUBATION;  Surgeon: Everett Graff, MD;  Location: [facility];  Service: Gynecology;;   LAPAROSCOPY N/A 11/27/2014   Procedure: LAPAROSCOPY OPERATIVE;  Surgeon: Everett Graff, MD;  Location: [facility];  Service: Gynecology;   Laterality: N/A;   thyroglossoduct cyst     x 2   WISDOM TOOTH EXTRACTION       Family History: Family History  Problem Relation Age of Onset   Depression Mother    Anxiety disorder  Mother    Depression Father    Hearing loss Paternal Uncle    Aneurysm Maternal Aunt    COPD Maternal Grandmother    Heart disease Paternal Grandfather      Social History: Keimani lives at home with her daughter.  Her significant other is incarcerated and now her daughter stays with her maternal great aunts.  Kerby works around 52 hours/week.  She was in a house that was built in 1995.  There is carpeting throughout the home.  She has electric heating and central cooling.  There are dogs in the home. There are no dust mite coverings on the bedding. There are no roaches.  There is no tobacco exposure in the house, but she does smoke in the car. She does not have a HEPA filter in the home.    Review of Systems  Constitutional: Negative.  Negative for chills, fever, malaise/fatigue and weight loss.  HENT:  Positive for sinus pain. Negative for congestion, ear discharge and ear pain.        Positive for postnasal drip.   Eyes:  Negative for pain, discharge and redness.  Respiratory:  Positive for cough and sputum production. Negative for shortness of breath and wheezing.   Cardiovascular: Negative.  Negative for chest pain and palpitations.  Gastrointestinal:  Negative for abdominal pain, constipation, diarrhea, heartburn, nausea and vomiting.  Skin:  Positive for itching and rash.  Neurological:  Negative for dizziness and headaches.  Endo/Heme/Allergies:  Negative for environmental allergies. Does not bruise/bleed easily.      Objective:   Blood pressure (!) 88/72, pulse (!) 105, temperature 98.4 F (36.9 C), temperature source Temporal, resp. rate 16, height _0  (1.6 m), weight 106 lb 9.6 oz (48.4 kg), SpO2 97 %. Body mass index is 18.88 kg/m.   Physical Exam Vitals reviewed.   Constitutional:      Appearance: She is well-developed and underweight.  HENT:     Head: Normocephalic and atraumatic.     Right Ear: Tympanic membrane and ear canal normal. No drainage, swelling or tenderness. Tympanic membrane is not injected, scarred, erythematous, retracted or bulging.     Left Ear: Tympanic membrane and ear canal normal. No drainage, swelling or tenderness. Tympanic membrane is not injected, scarred, erythematous, retracted or bulging.     Nose: No nasal deformity, septal deviation, mucosal edema or rhinorrhea.     Right Turbinates: Enlarged, swollen and pale.     Left Turbinates: Enlarged, swollen and pale.     Right Sinus: No maxillary sinus tenderness or frontal sinus tenderness.     Left Sinus: No maxillary sinus tenderness or frontal sinus tenderness.     Mouth/Throat:     Mouth: Mucous membranes are not pale and not dry.     Pharynx: Uvula midline.  Eyes:     General:        Right eye: No discharge.        Left eye: No discharge.     Conjunctiva/sclera: Conjunctivae normal.     Right eye: Right conjunctiva is not injected. No chemosis.    Left eye: Left conjunctiva is not injected. No chemosis.    Pupils: Pupils are equal, round, and reactive to light.  Cardiovascular:     Rate and Rhythm: Normal rate and regular rhythm.     Heart sounds: Normal heart sounds.  Pulmonary:     Effort: Pulmonary effort is normal. No tachypnea, accessory muscle usage or respiratory distress.     Breath sounds:  Normal breath sounds. No wheezing, rhonchi or rales.     Comments: Moving air well in all lung fields. No increased work of breathing noted.  Chest:     Chest wall: No tenderness.  Abdominal:     Tenderness: There is no abdominal tenderness. There is no guarding or rebound.  Lymphadenopathy:     Head:     Right side of head: No submandibular, tonsillar or occipital adenopathy.     Left side of head: No submandibular, tonsillar or occipital adenopathy.     Cervical:  No cervical adenopathy.  Skin:    General: Skin is warm.     Capillary Refill: Capillary refill takes less than 2 seconds.     Coloration: Skin is not pale.     Findings: No abrasion, erythema, petechiae or rash. Rash is not papular, urticarial or vesicular.     Comments: No eczematous or urticarial lesions noted.   Neurological:     Mental Status: She is alert.  Psychiatric:        Behavior: Behavior is cooperative.     Diagnostic studies:    Spirometry: results abnormal (FEV1: 2.14/72%, FVC: 3.26/91%, FEV1/FVC: 66%).  Spirometry consistent with mild obstructive disease. Xopenex four puffs via MDI treatment given in clinic with no improvement.  Allergy Studies:    Airborne Adult Perc - 05/27/21 1458     Time Antigen Placed 1458    Allergen Manufacturer Lavella Hammock    Location Back    Number of Test 59    1. Control-Buffer 50% Glycerol Negative    2. Control-Histamine 1 mg/ml 2+    3. Albumin saline Negative    4. Goofy Ridge Negative    5. [place] Negative    6. Johnson Negative    7. [place] Blue Negative    8. Meadow Fescue Negative    9. Perennial Rye Negative    10. Sweet Vernal Negative    11. Timothy Negative    12. Cocklebur Negative    13. Burweed Marshelder Negative    14. Ragweed, short Negative    15. Ragweed, Giant Negative    16. Plantain,  English Negative    17. Lamb's Quarters Negative    18. Sheep Sorrell Negative    19. Rough Pigweed Negative    20. Marsh Elder, Rough Negative    21. Mugwort, Common Negative    22. Ash mix Negative    23. Birch mix Negative    24. Beech American Negative    25. Box, Elder Negative    26. Cedar, red Negative    27. Cottonwood, [place] Negative    28. Elm mix Negative    29. Hickory Negative    30. Maple mix Negative    31. Oak, [place] mix Negative    32. Pecan Pollen Negative    33. Pine mix Negative    34. Sycamore Eastern Negative    35. [place], Black Pollen Negative    36. Alternaria alternata Negative    37.  Cladosporium Herbarum Negative    38. Aspergillus mix Negative    39. Penicillium mix Negative    40. Bipolaris sorokiniana (Helminthosporium) Negative    41. Drechslera spicifera (Curvularia) Negative    42. Mucor plumbeus Negative    43. Fusarium moniliforme Negative    44. Aureobasidium pullulans (pullulara) Negative    45. Rhizopus oryzae Negative    46. Botrytis cinera Negative    47. Epicoccum nigrum Negative    48. Phoma betae Negative  49. Candida Albicans Negative    50. Trichophyton mentagrophytes Negative    51. Mite, D Farinae  5,000 AU/ml Negative    52. Mite, D Pteronyssinus  5,000 AU/ml Negative    53. Cat Hair 10,000 BAU/ml Negative    54.  Dog Epithelia Negative    55. Mixed Feathers Negative    56. Horse Epithelia Negative    57. Cockroach, German Negative    58. Mouse Negative    59. Tobacco Leaf Negative             Food Perc - 05/27/21 1458       Test Information   Time Antigen Placed 7711    Allergen Manufacturer Lavella Hammock    Location Back    Number of allergen test [place]   1. Peanut Negative    2. Soybean food Negative    3. Wheat, whole Negative    4. Sesame Negative    5. Milk, cow Negative    6. Egg White, chicken Negative    7. Casein Negative    8. Shellfish mix Negative    9. Fish mix Negative    10. Cashew Negative             Intradermal - 05/27/21 1540     Time Antigen Placed 1545    Allergen Manufacturer Lavella Hammock    Location Arm    Number of Test 15    Intradermal Select    Control Negative    Guatem[place] Negative    Johnson Negative    7 Grass Negative    Ragweed mix Negative    Weed mix Negative    Tree mix Negative    Mold 1 Negative    Mold 2 Negative    Mold 3 Negative    Mold 4 Negative    Cat Negative    Dog 2+    Cockroach Negative    Mite mix Negative             Allergy testing results were read and interpreted by myself, documented by clinical staff.     Salvatore Marvel,  MD Allergy and [facility] [place]

## 2021-05-27 NOTE — Patient Instructions (Signed)
1. Moderate persistent asthma, uncomplicated - Lung testing looked slightly low and it did not change much with the Xopenex (inhaler) treatment.  - We are going to change you from [facility] to [facility] instead (this contains three medications that can help with your breathing). - I do not think that we need prednisone at this point in time.  - Spacer use reviewed. - Daily controller medication(s):  Breztri two puffs twice daily with spacer - Prior to physical activity: albuterol 2 puffs 10-15 minutes before physical activity. - Rescue medications: albuterol 4 puffs every 4-6 hours as needed - Asthma control goals:  * Full participation in all desired activities (may need albuterol before activity) * Albuterol use two time or less a week on average (not counting use with activity) * Cough interfering with sleep two time or less a month * Oral steroids no more than once a year * No hospitalizations  2. Chronic urticaria - Your history does not have any "red flags" such as fevers, joint pains, or permanent skin changes that would be concerning for a more serious cause of hives.  - We will get some labs to rule out serious causes of hives: alpha gal panel, complete blood count, tryptase level, chronic urticaria panel, CMP, ESR, and CRP. - Chronic hives are often times a self limited process and will "burn themselves out" over 6-12 months, although this is not always the case.  - In the meantime, start suppressive dosing of antihistamines:   - Morning: Xyzal (levocetirizine) 8m (two tablets) + Pepcid (famotidine) 271m - Evening: Karbinal ER 20 mL  + hydroxyzine 103mone tablet) + Pepcid (famotidine) 18m14mConsent signed for Xolair, sop we are going to work on getting this approved (even after gettOccupational psychologistis will still cover Xolair without a problem).  - Tammy, our Biologics Coordinator, will call you to  discuss the approval process.   3. Perennial allergic rhinitis (dog) -  Testing today showed: dog - Copy of test results provided.  - Avoidance measures provided. - We are going to avoid the nasal sprays since you are not a fan of those.  - Start taking: Karbinal ER 20 mL at night (this can cause sleepiness, so you can decrease the dose if needed) - You can use an extra dose of the antihistamine, if needed, for breakthrough symptoms.  - Consider nasal saline rinses 1-2 times daily to remove allergens from the nasal cavities as well as help with mucous clearance (this is especially helpful to do before the nasal sprays are given)  4. Return in about 4 weeks (around 06/24/2021).    Please inform us oKoreaany Emergency Department visits, hospitalizations, or changes in symptoms. Call us bKoreaore going to the ED for breathing or allergy symptoms since we might be able to fit you in for a sick visit. Feel free to contact us aKoreatime with any questions, problems, or concerns.  It was a pleasure to meet you today! Good luck with this week - I will be sending good vibes your way on Wednesday!   Websites that have reliable patient information: 1. American Academy of Asthma, Allergy, and Immunology: www.aaaai.org 2. Food Allergy Research and Education (FARE): foodallergy.org 3. Mothers of Asthmatics: http://www.asthmacommunitynetwork.org 4. American College of Allergy, Asthma, and Immunology: www.acaai.org   COVID-19 Vaccine Information can be found at: [email or web address] questions related to vaccine distribution or appointments, please email [email or web address] or call [phone] realize that you might be concerned about having  an allergic reaction to the COVID19 vaccines. To help with that concern, WE ARE OFFERING THE COVID19 VACCINES IN OUR OFFICE! Ask the front desk for dates!  ° ° ° °“Like” us on Facebook and Instagram for our latest updates!  °  ° ° °A healthy democracy works best when ALL voters  participate! Make sure you are registered to vote! If you have moved or changed any of your contact information, you will need to get this updated before voting! ° °In some cases, you MAY be able to register to vote online: https://www.ncsbe.gov/Voters/Registering-to-Vote ° ° ° Airborne Adult Perc - 05/27/21 1458   ° ° Time Antigen Placed 1458   ° Allergen Manufacturer Greer   ° Location Back   ° Number of Test 59   ° 1. Control-Buffer 50% Glycerol Negative   ° 2. Control-Histamine 1 mg/ml 2+   ° 3. Albumin saline Negative   ° 4. Bahia Negative   ° 5. Bermuda Negative   ° 6. Johnson Negative   ° 7. Kentucky Blue Negative   ° 8. Meadow Fescue Negative   ° 9. Perennial Rye Negative   ° 10. Sweet Vernal Negative   ° 11. Timothy Negative   ° 12. Cocklebur Negative   ° 13. Burweed Marshelder Negative   ° 14. Ragweed, short Negative   ° 15. Ragweed, Giant Negative   ° 16. Plantain,  English Negative   ° 17. Lamb's Quarters Negative   ° 18. Sheep Sorrell Negative   ° 19. Rough Pigweed Negative   ° 20. Marsh Elder, Rough Negative   ° 21. Mugwort, Common Negative   ° 22. Ash mix Negative   ° 23. Birch mix Negative   ° 24. Beech American Negative   ° 25. Box, Elder Negative   ° 26. Cedar, red Negative   ° 27. Cottonwood, Eastern Negative   ° 28. Elm mix Negative   ° 29. Hickory Negative   ° 30. Maple mix Negative   ° 31. Oak, Eastern mix Negative   ° 32. Pecan Pollen Negative   ° 33. Pine mix Negative   ° 34. Sycamore Eastern Negative   ° 35. Walnut, Black Pollen Negative   ° 36. Alternaria alternata Negative   ° 37. Cladosporium Herbarum Negative   ° 38. Aspergillus mix Negative   ° 39. Penicillium mix Negative   ° 40. Bipolaris sorokiniana (Helminthosporium) Negative   ° 41. Drechslera spicifera (Curvularia) Negative   ° 42. Mucor plumbeus Negative   ° 43. Fusarium moniliforme Negative   ° 44. Aureobasidium pullulans (pullulara) Negative   ° 45. Rhizopus oryzae Negative   ° 46. Botrytis cinera Negative   ° 47. Epicoccum  nigrum Negative   ° 48. Phoma betae Negative   ° 49. Candida Albicans Negative   ° 50. Trichophyton mentagrophytes Negative   ° 51. Mite, D Farinae  5,000 AU/ml Negative   ° 52. Mite, D Pteronyssinus  5,000 AU/ml Negative   ° 53. Cat Hair 10,000 BAU/ml Negative   ° 54.  Dog Epithelia Negative   ° 55. Mixed Feathers Negative   ° 56. Horse Epithelia Negative   ° 57. Cockroach, German Negative   ° 58. Mouse Negative   ° 59. Tobacco Leaf Negative   ° °  °  ° °  ° ° Food Perc - 05/27/21 1458   ° °  ° Test Information  ° Time Antigen Placed 1458   ° Allergen Manufacturer Greer   ° Location   Back   ° Number of allergen test 10   ° Food Select   °  ° Food  ° 1. Peanut Negative   ° 2. Soybean food Negative   ° 3. Wheat, whole Negative   ° 4. Sesame Negative   ° 5. Milk, cow Negative   ° 6. Egg White, chicken Negative   ° 7. Casein Negative   ° 8. Shellfish mix Negative   ° 9. Fish mix Negative   ° 10. Cashew Negative   ° °  °  ° °  ° ° Intradermal - 05/27/21 1540   ° ° Time Antigen Placed 1545   ° Allergen Manufacturer Greer   ° Location Arm   ° Number of Test 15   ° Intradermal Select   ° Control Negative   ° Bermuda Negative   ° Johnson Negative   ° 7 Grass Negative   ° Ragweed mix Negative   ° Weed mix Negative   ° Tree mix Negative   ° Mold 1 Negative   ° Mold 2 Negative   ° Mold 3 Negative   ° Mold 4 Negative   ° Cat Negative   ° Dog 2+   ° Cockroach Negative   ° Mite mix Negative   ° °  °  ° °  ° ° °Control of Dog or Cat Allergen ° °Avoidance is the best way to manage a dog or cat allergy. If you have a dog or cat and are allergic to dog or cats, consider removing the dog or cat from the home. °If you have a dog or cat but don’t want to find it a new home, or if your family wants a pet even though someone in the household is allergic, here are some strategies that may help keep symptoms at bay: ° °Keep the pet out of your bedroom and restrict it to only a few rooms. Be advised that keeping the dog or cat in only one  room will not limit the allergens to that room. °Don’t pet, hug or kiss the dog or cat; if you do, wash your hands with soap and water. °High-efficiency particulate air (HEPA) cleaners run continuously in a bedroom or living room can reduce allergen levels over time. °Regular use of a high-efficiency vacuum cleaner or a central vacuum can reduce allergen levels. °Giving your dog or cat a bath at least once a week can reduce airborne allergen. ° ° ° ° ° ° ° ° ° °

## 2021-05-28 ENCOUNTER — Telehealth: Payer: Self-pay | Admitting: *Deleted

## 2021-05-28 NOTE — Telephone Encounter (Signed)
Called patient to discuss Xolair and she was at work and will call me back later to discuss

## 2021-05-28 NOTE — Telephone Encounter (Signed)
-----   Message from Alfonse Spruce, MD sent at 05/27/2021  4:29 PM EST ----- Likely new start Xolair for CIU. She signed paperwork.

## 2021-05-30 ENCOUNTER — Other Ambulatory Visit: Payer: Self-pay | Admitting: Family Medicine

## 2021-05-30 DIAGNOSIS — F4323 Adjustment disorder with mixed anxiety and depressed mood: Secondary | ICD-10-CM

## 2021-05-30 NOTE — Telephone Encounter (Signed)
Patient and pharmacy are requesting a 90 day supply. Please review

## 2021-05-31 ENCOUNTER — Other Ambulatory Visit: Payer: Self-pay | Admitting: Family Medicine

## 2021-05-31 DIAGNOSIS — F4323 Adjustment disorder with mixed anxiety and depressed mood: Secondary | ICD-10-CM

## 2021-06-04 ENCOUNTER — Telehealth (INDEPENDENT_AMBULATORY_CARE_PROVIDER_SITE_OTHER): Payer: Medicaid Other | Admitting: Nurse Practitioner

## 2021-06-04 ENCOUNTER — Encounter: Payer: Self-pay | Admitting: Nurse Practitioner

## 2021-06-04 DIAGNOSIS — J069 Acute upper respiratory infection, unspecified: Secondary | ICD-10-CM

## 2021-06-04 HISTORY — DX: Acute upper respiratory infection, unspecified: J06.9

## 2021-06-04 LAB — ALPHA-GAL PANEL
Allergen Lamb IgE: <0.1 kU/L
Beef IgE: <0.1 kU/L
IgE (Immunoglobulin E), Serum: 166 IU/mL (ref 6–495)
O215-IgE Alpha-Gal: <0.1 kU/L
Pork IgE: <0.1 kU/L

## 2021-06-04 MED ORDER — AZITHROMYCIN 250 MG PO TABS: ORAL_TABLET | ORAL | 0 refills | Status: AC

## 2021-06-04 MED ORDER — DM-GUAIFENESIN ER 30-600 MG PO TB12: 1.0000 | ORAL_TABLET | Freq: Two times a day (BID) | ORAL | 0 refills | Status: DC

## 2021-06-04 MED ORDER — BENZONATATE 100 MG PO CAPS: 100.0000 mg | ORAL_CAPSULE | Freq: Three times a day (TID) | ORAL | 0 refills | Status: DC | PRN

## 2021-06-04 NOTE — Progress Notes (Signed)
° °  Virtual Visit  Note Due to COVID-19 pandemic this visit was conducted virtually. This visit type was conducted due to national recommendations for restrictions regarding the COVID-19 Pandemic (e.g. social distancing, sheltering in place) in an effort to limit this patient's exposure and mitigate transmission in our community. All issues noted in this document were discussed and addressed.  A physical exam was not performed with this format.  I connected with Pamela Gallagher on 06/04/21 at 10:25 AM by telephone and verified that I am speaking with the correct person using two identifiers. Pamela Gallagher is currently located at  home during visit. The provider, Daryll Drown, NP is located in their office at time of visit.  I discussed the limitations, risks, security and privacy concerns of performing an evaluation and management service by telephone and the availability of in person appointments. I also discussed with the patient that there may be a patient responsible charge related to this service. The patient expressed understanding and agreed to proceed.   History and Present Illness:  Cough This is a new problem. The current episode started 1 to 4 weeks ago. The problem has been gradually worsening. The problem occurs constantly. The cough is Productive of sputum. Associated symptoms include nasal congestion. Pertinent negatives include no chills, ear congestion, ear pain, fever, rash, sore throat, sweats or wheezing. Nothing aggravates the symptoms.     Review of Systems  Constitutional:  Negative for chills and fever.  HENT:  Negative for ear pain and sore throat.   Eyes: Negative.   Respiratory:  Positive for cough. Negative for wheezing.   Cardiovascular: Negative.   Skin: Negative.  Negative for rash.  All other systems reviewed and are negative.   Observations/Objective: Televisit patient not in distress.  Assessment and Plan: Take meds as prescribed - Use a cool mist  humidifier  -Use saline nose sprays frequently -Force fluids -For fever or aches or pains- take Tylenol or ibuprofen. -Azithromycin 500 mg tablet by mouth day 1, 250 mg tablet day 2-5. -Benzonatate for cough -Guaifenesin for cough and congestion. -If symptoms do not improve, she may need to be COVID tested to rule this out Follow up with worsening unresolved symptoms   Follow Up Instructions: With worsening unresolved symptoms    I discussed the assessment and treatment plan with the patient. The patient was provided an opportunity to ask questions and all were answered. The patient agreed with the plan and demonstrated an understanding of the instructions.   The patient was advised to call back or seek an in-person evaluation if the symptoms worsen or if the condition fails to improve as anticipated.  The above assessment and management plan was discussed with the patient. The patient verbalized understanding of and has agreed to the management plan. Patient is aware to call the clinic if symptoms persist or worsen. Patient is aware when to return to the clinic for a follow-up visit. Patient educated on when it is appropriate to go to the emergency department.   Time call ended:  10:35 am   I provided 10 minutes of  non face-to-face time during this encounter.    Daryll Drown, NP

## 2021-06-05 ENCOUNTER — Encounter: Payer: Self-pay | Admitting: *Deleted

## 2021-06-05 LAB — CMP14+EGFR
ALT: 16 IU/L (ref 0–32)
AST: 28 IU/L (ref 0–40)
Albumin/Globulin Ratio: 1.8 (ref 1.2–2.2)
Albumin: 4.7 g/dL (ref 3.8–4.8)
Alkaline Phosphatase: 112 IU/L (ref 44–121)
BUN/Creatinine Ratio: 9 (ref 9–23)
BUN: 9 mg/dL (ref 6–20)
Bilirubin Total: 0.3 mg/dL (ref 0.0–1.2)
CO2: 25 mmol/L (ref 20–29)
Calcium: 9.3 mg/dL (ref 8.7–10.2)
Chloride: 100 mmol/L (ref 96–106)
Creatinine, Ser: 0.95 mg/dL (ref 0.57–1.00)
Globulin, Total: 2.6 g/dL (ref 1.5–4.5)
Glucose: 85 mg/dL (ref 70–99)
Potassium: 4 mmol/L (ref 3.5–5.2)
Sodium: 140 mmol/L (ref 134–144)
Total Protein: 7.3 g/dL (ref 6.0–8.5)
eGFR: 79 mL/min/{1.73_m2} (ref >59)

## 2021-06-05 LAB — SEDIMENTATION RATE: Sed Rate: 4 mm/hr (ref 0–32)

## 2021-06-05 LAB — CBC WITH DIFFERENTIAL
Basophils Absolute: 0.1 10*3/uL (ref 0.0–0.2)
Basos: 1 %
EOS (ABSOLUTE): 0.2 10*3/uL (ref 0.0–0.4)
Eos: 3 %
Hematocrit: 42.2 % (ref 34.0–46.6)
Hemoglobin: 14.5 g/dL (ref 11.1–15.9)
Immature Grans (Abs): 0 10*3/uL (ref 0.0–0.1)
Immature Granulocytes: 0 %
Lymphocytes Absolute: 2.5 10*3/uL (ref 0.7–3.1)
Lymphs: 35 %
MCH: 30.7 pg (ref 26.6–33.0)
MCHC: 34.4 g/dL (ref 31.5–35.7)
MCV: 89 fL (ref 79–97)
Monocytes Absolute: 0.6 10*3/uL (ref 0.1–0.9)
Monocytes: 8 %
Neutrophils Absolute: 3.9 10*3/uL (ref 1.4–7.0)
Neutrophils: 53 %
RBC: 4.72 x10E6/uL (ref 3.77–5.28)
RDW: 12.5 % (ref 11.7–15.4)
WBC: 7.3 10*3/uL (ref 3.4–10.8)

## 2021-06-05 LAB — ANTINUCLEAR ANTIBODIES, IFA: ANA Titer 1: NEGATIVE

## 2021-06-05 LAB — C-REACTIVE PROTEIN: CRP: 8 mg/L (ref 0–10)

## 2021-06-05 LAB — CHRONIC URTICARIA: cu index: 7.7 (ref <10)

## 2021-06-05 LAB — THYROID ANTIBODIES
Thyroglobulin Antibody: <1 IU/mL (ref 0.0–0.9)
Thyroperoxidase Ab SerPl-aCnc: 10 IU/mL (ref 0–34)

## 2021-06-05 LAB — TRYPTASE: Tryptase: 4.7 ug/L (ref 2.2–13.2)

## 2021-06-05 NOTE — Telephone Encounter (Signed)
Tried to reach patient but unabl e to leave message mailbox full

## 2021-06-05 NOTE — Telephone Encounter (Signed)
Mychart message sent.

## 2021-06-11 ENCOUNTER — Other Ambulatory Visit: Payer: Medicaid Other | Admitting: Adult Health

## 2021-06-12 NOTE — Telephone Encounter (Signed)
No response from patient

## 2021-06-13 NOTE — Telephone Encounter (Signed)
Sounds good! Thanks, Tam Tam!   Salvatore Marvel, MD [facility]

## 2021-06-19 ENCOUNTER — Ambulatory Visit: Payer: Medicaid Other | Admitting: Family Medicine

## 2021-06-20 ENCOUNTER — Encounter: Payer: Self-pay | Admitting: Family Medicine

## 2021-06-24 ENCOUNTER — Ambulatory Visit: Payer: Medicaid Other | Admitting: Family Medicine

## 2021-06-24 ENCOUNTER — Other Ambulatory Visit: Payer: Self-pay | Admitting: Family Medicine

## 2021-06-24 ENCOUNTER — Other Ambulatory Visit: Payer: Self-pay | Admitting: Allergy & Immunology

## 2021-06-24 DIAGNOSIS — F331 Major depressive disorder, recurrent, moderate: Secondary | ICD-10-CM

## 2021-06-24 DIAGNOSIS — F4323 Adjustment disorder with mixed anxiety and depressed mood: Secondary | ICD-10-CM

## 2021-06-24 NOTE — Progress Notes (Deleted)
? ?  [place], [place] [address] ?Dept: [phone] ? ?FOLLOW UP NOTE ? ?Patient ID: Pamela Gallagher, female    DOB: Apr 27, 1985  Age: 36 y.o. MRN: [id number] ?Date of Office Visit: 06/24/2021 ? ?Assessment  ?Chief Complaint: No chief complaint on file. ? ?HPI ?Pamela Gallagher is a 36 year old female who presents to the clinic for follow-up visit.  She was last seen in this clinic on 05/27/2021 by Dr. Ernst Bowler for evaluation of asthma, allergic rhinitis, urticaria, and tobacco use.  Her last environmental allergy skin testing was on 05/27/2021 and was positive to dog. ? ? ?Drug Allergies:  ?Allergies  ?Allergen Reactions  ? Clindamycin/Lincomycin Shortness Of Breath  ? Penicillins Anaphylaxis  ?  Has patient had a PCN reaction causing immediate rash, facial/tongue/throat swelling, SOB or lightheadedness with hypotension: Yes ?Has patient had a PCN reaction causing severe rash involving mucus membranes or skin necrosis: Yes ?Has patient had a PCN reaction that required hospitalization Yes ?Has patient had a PCN reaction occurring within the last 10 years: No ?If all of the above answers are "NO", then may proceed with Cephalosporin use. ?  ? Sulfa Antibiotics Anaphylaxis  ? ? ?Physical Exam: ?There were no vitals taken for this visit.  ? ?Physical Exam ? ?Diagnostics: ?  ? ?Assessment and Plan: ?No diagnosis found. ? ?No orders of the defined types were placed in this encounter. ? ? ?There are no Patient Instructions on file for this visit. ? ?No follow-ups on file. ?  ? ?Thank you for the opportunity to care for this patient.  Please do not hesitate to contact me with questions. ? ?Gareth Morgan, FNP ?[facility] ? ? ? ? ? ?

## 2021-07-03 ENCOUNTER — Other Ambulatory Visit: Payer: Self-pay

## 2021-07-03 ENCOUNTER — Encounter: Payer: Self-pay | Admitting: Women's Health

## 2021-07-03 ENCOUNTER — Ambulatory Visit (INDEPENDENT_AMBULATORY_CARE_PROVIDER_SITE_OTHER): Payer: Medicaid Other | Admitting: Women's Health

## 2021-07-03 ENCOUNTER — Other Ambulatory Visit (HOSPITAL_COMMUNITY)
Admission: RE | Admit: 2021-07-03 | Discharge: 2021-07-03 | Disposition: A | Discharge status: Home / Self Care | Payer: Medicaid Other | Source: Ambulatory Visit | Attending: Adult Health | Admitting: Adult Health

## 2021-07-03 VITALS — BP 117/72 | HR 93 | Ht 63.0 in | Wt 110.0 lb

## 2021-07-03 DIAGNOSIS — Z01419 Encounter for gynecological examination (general) (routine) without abnormal findings: Secondary | ICD-10-CM

## 2021-07-03 DIAGNOSIS — Z30011 Encounter for initial prescription of contraceptive pills: Secondary | ICD-10-CM | POA: Diagnosis not present

## 2021-07-03 DIAGNOSIS — Z0000 Encounter for general adult medical examination without abnormal findings: Secondary | ICD-10-CM | POA: Diagnosis not present

## 2021-07-03 MED ORDER — SLYND 4 MG PO TABS: 1.0000 | ORAL_TABLET | Freq: Every day | ORAL | 3 refills | Status: DC

## 2021-07-03 MED ORDER — METRONIDAZOLE 0.75 % VA GEL: 1.0000 | Freq: Every day | VAGINAL | 0 refills | Status: DC

## 2021-07-03 NOTE — Patient Instructions (Signed)
Recurrent Vaginitis Alternative Therapies  Both options are to be done after sexual intercourse, your period ends, and when you think you may have bacterial vaginosis (BV) or a yeast infection.  If symptoms persist you will need to schedule an appointment to be seen  1) Soak in tub of waist- high warm water with 1/2 cup of baking soda for at least 20 mins.  2) Soak 3 tampons in 1 tablespoon of fractionated (liquid form) coconut oil with 10 drops of Melaleuca (Tea Tree) essential oil, insert 1 saturated tampon vaginally at bedtime x 3 days.    You can purchase Melaleuca/Tea Tree oil online at amazon.com, with a [employer] or [employer] representative, or locally at:  [employer] [address] [place], [place] [address] [phone]  [employer] [address] [place], [place] [address] [phone]  You may also want to consider making the changes below:  . Soap: Unscented Dove (white box light green writing)  . Wash cloth: use a separate white washcloth for your genital area . Laundry detergent: Dreft or unscented Arm n' Hammer  . Underwear: White 100% cotton panties (NOT just cotton crouch) . Sanitary pads/tampons: Unscented only-If it doesn't SAY unscented it can have a scent/perfume    . NO PERFUMES OR LOTIONS OR POTIONS in the genital area (may use regular KY) . Condoms: hypoallergenic only, non-dyed (no color) . Toilet paper: White unscented only   

## 2021-07-03 NOTE — Progress Notes (Signed)
? ?WELL-WOMAN EXAMINATION ?Patient name: Pamela Gallagher MRN [id number]  Date of birth: [birth year] ?Chief Complaint:   ?Gynecologic Exam and Annual Exam ? ?History of Present Illness:   ?Pamela Gallagher is a 36 y.o. G52P1001 Caucasian female being seen today for a routine well-woman exam. BV after sex and period.  ?Current complaints: wants to get on birth control. Smokes 3 cigarettes daily. No h/o HTN, DVT/PE, CVA, MI, or migraines w/ aura.  ? ?PCP: [facility]      ?Wants STD screen ?Patient's last menstrual period was 06/27/2021 (exact date). ?The current method of family planning is abstinence.  ?Last pap 2019. Results were:  neg . H/O abnormal pap: no ?Last mammogram: never. Results were: N/A. Family h/o breast cancer: no ?Last colonoscopy: never. Results were: N/A. Family h/o colorectal cancer: no ? ?Depression screen [facility] 2/9 07/03/2021 05/08/2021 03/20/2021 03/11/2021 08/22/2020  ?Decreased Interest 1 3 1 1 1   ?Down, Depressed, Hopeless 1 3 1 1 1   ?PHQ - 2 Score 2 6 2 2 2   ?Altered sleeping 2 3 2 2 2   ?Tired, decreased energy 2 3 1 1 2   ?Change in appetite 1 3 1 1 1   ?Feeling bad or failure about yourself  0 1 1 1 1   ?Trouble concentrating 3 1 1 1 1   ?Moving slowly or fidgety/restless 0 1 0 0 0  ?Suicidal thoughts 0 0 0 0 0  ?PHQ-9 Score 10 18 8 8 9   ?Difficult doing work/chores - Very difficult Very difficult Somewhat difficult Somewhat difficult  ? ?  ?GAD 7 : Generalized Anxiety Score 07/03/2021 05/08/2021 03/20/2021 03/11/2021  ?Nervous, Anxious, on Edge 1 3 2 1   ?Control/stop worrying 1 3 1 1   ?Worry too much - different things 1 2 1 2   ?Trouble relaxing 3 3 2 2   ?Restless 0 1 1 1   ?Easily annoyed or irritable 1 3 1 2   ?Afraid - awful might happen 1 3 0 -  ?Total GAD 7 Score 8 18 8  -  ?Anxiety Difficulty - Very difficult Very difficult Very difficult  ? ? ? ?Review of Systems:   ?Pertinent items are noted in HPI ?Denies any headaches, blurred vision, fatigue, shortness of breath, chest pain, abdominal pain, abnormal  vaginal discharge/itching/odor/irritation, problems with periods, bowel movements, urination, or intercourse unless otherwise stated above. ?Pertinent History Reviewed:  ?Reviewed past medical,surgical, social and family history.  ?Reviewed problem list, medications and allergies. ?Physical Assessment:  ? ?Vitals:  ? 07/03/21 1102  ?BP: 117/72  ?Pulse: 93  ?Weight: 110 lb (49.9 kg)  ?Height: 5\' 3"  (1.6 m)  ?Body mass index is 19.49 kg/m?. ?  ?     Physical Examination:  ? General appearance - well appearing, and in no distress ? Mental status - alert, oriented to person, place, and time ? Psych:  She has a normal mood and affect ? Skin - warm and dry, normal color, no suspicious lesions noted ? Chest - effort normal, all lung fields clear to auscultation bilaterally ? Heart - normal rate and regular rhythm ? Neck:  midline trachea, no thyromegaly or nodules ? Breasts - breasts appear normal, no suspicious masses, no skin or nipple changes or  axillary nodes ? Abdomen - soft, nontender, nondistended, no masses or organomegaly ? Pelvic - VULVA: normal appearing vulva with no masses, tenderness or lesions  VAGINA: normal appearing vagina with normal color and discharge, no lesions  CERVIX: normal appearing cervix without discharge or lesions, no CMT ? Thin prep  pap is done w/ HR HPV cotesting ? UTERUS: uterus is felt to be normal size, shape, consistency and nontender  ? ADNEXA: No adnexal masses or tenderness noted. ? Extremities:  No swelling or varicosities noted ? ?Chaperone: Lawanna Kobus Neas   ? ?No results found for this or any previous visit (from the past 24 hour(s)).  ?Assessment & Plan:  ?1) Well-Woman Exam ? 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 ?3) Contraception management> discussed all options, is >35 and smokes, so not a candidate for estrogen. Wants POPs, rx slynd to my scripts ? ?4) STD screen> on pap ? ?Labs/procedures today: pap ? ?Mammogram: @ 37yo, or sooner if  problems ?Colonoscopy: @ 37yo, or sooner if problems ? ?No orders of the defined types were placed in this encounter. ? ? ?Meds:  ?Meds ordered this encounter  ?Medications  ? metroNIDAZOLE (METROGEL VAGINAL) 0.75 % vaginal gel  ?  Sig: Place 1 Applicatorful vaginally at bedtime. After sex or period  ?  Dispense:  70 g  ?  Refill:  0  ?  Order Specific Question:   Supervising Provider  ?  Answer:   Duane Lope H [[id number]]  ? Drospirenone (SLYND) 4 MG TABS  ?  Sig: Take 1 tablet by mouth daily.  ?  Dispense:  90 tablet  ?  Refill:  3  ?  Order Specific Question:   Supervising Provider  ?  Answer:   Duane Lope H [[id number]]  ? ? ?Follow-up: Return in about 1 year (around 07/04/2022) for Physical. ? ?Cheral Marker CNM, WHNP-BC ?07/03/2021 ?11:49 AM  ?

## 2021-07-04 ENCOUNTER — Encounter: Payer: Self-pay | Admitting: Women's Health

## 2021-07-04 LAB — CYTOLOGY - PAP
Chlamydia: NEGATIVE
Comment: NEGATIVE
Comment: NEGATIVE
Comment: NORMAL
Diagnosis: NEGATIVE
High risk HPV: NEGATIVE
Neisseria Gonorrhea: NEGATIVE

## 2021-07-08 ENCOUNTER — Telehealth: Payer: Self-pay | Admitting: *Deleted

## 2021-07-08 NOTE — Telephone Encounter (Signed)
Patient reached out after I sent my cahart message advising I could not contact her since voicemail full. I advised approval and submit to [facility] for Xolair for her urticaria and will reach out once delivery set up to make appt to start therapy ?

## 2021-07-16 ENCOUNTER — Ambulatory Visit (INDEPENDENT_AMBULATORY_CARE_PROVIDER_SITE_OTHER): Payer: Medicaid Other | Admitting: Family Medicine

## 2021-07-16 ENCOUNTER — Encounter: Payer: Self-pay | Admitting: Family Medicine

## 2021-07-16 VITALS — BP 107/72 | HR 91 | Temp 98.4°F | Ht 63.0 in | Wt 110.2 lb

## 2021-07-16 DIAGNOSIS — J0100 Acute maxillary sinusitis, unspecified: Secondary | ICD-10-CM

## 2021-07-16 DIAGNOSIS — M797 Fibromyalgia: Secondary | ICD-10-CM

## 2021-07-16 DIAGNOSIS — R41840 Attention and concentration deficit: Secondary | ICD-10-CM

## 2021-07-16 DIAGNOSIS — F419 Anxiety disorder, unspecified: Secondary | ICD-10-CM

## 2021-07-16 DIAGNOSIS — K219 Gastro-esophageal reflux disease without esophagitis: Secondary | ICD-10-CM

## 2021-07-16 DIAGNOSIS — F331 Major depressive disorder, recurrent, moderate: Secondary | ICD-10-CM

## 2021-07-16 DIAGNOSIS — M791 Myalgia, unspecified site: Secondary | ICD-10-CM

## 2021-07-16 DIAGNOSIS — J45991 Cough variant asthma: Secondary | ICD-10-CM | POA: Diagnosis not present

## 2021-07-16 DIAGNOSIS — Z0001 Encounter for general adult medical examination with abnormal findings: Secondary | ICD-10-CM | POA: Diagnosis not present

## 2021-07-16 DIAGNOSIS — I7300 Raynaud's syndrome without gangrene: Secondary | ICD-10-CM

## 2021-07-16 DIAGNOSIS — Z0000 Encounter for general adult medical examination without abnormal findings: Secondary | ICD-10-CM

## 2021-07-16 MED ORDER — TIZANIDINE HCL 4 MG PO TABS: ORAL_TABLET | ORAL | 1 refills | Status: DC

## 2021-07-16 MED ORDER — GABAPENTIN 300 MG PO CAPS: 300.0000 mg | ORAL_CAPSULE | Freq: Three times a day (TID) | ORAL | 5 refills | Status: DC

## 2021-07-16 MED ORDER — AZITHROMYCIN 250 MG PO TABS: ORAL_TABLET | ORAL | 0 refills | Status: DC

## 2021-07-16 MED ORDER — ATOMOXETINE HCL 40 MG PO CAPS: 40.0000 mg | ORAL_CAPSULE | Freq: Every day | ORAL | 2 refills | Status: DC

## 2021-07-16 NOTE — Patient Instructions (Addendum)
Schedule a visit with the eye doctor and dentist. ? ?Zinc gluconate (50 to 150 mg/day) or gingko biloba for Raynaud's. ?

## 2021-07-16 NOTE — Progress Notes (Signed)
? ?Assessment & Plan:  ?1. Well adult exam ?Preventive health education provided.  Encouraged to schedule a visit with the eye doctor and the dentist.  Patient declined lab work. ? ?2. Recurrent moderate major depressive disorder with anxiety (HCC) ?Well controlled on current regimen.  ? ?3. Difficulty concentrating ?Uncontrolled.  Trial of Strattera. ?- atomoxetine (STRATTERA) 40 MG capsule; Take 1 capsule (40 mg total) by mouth daily.  Dispense: 30 capsule; Refill: 2 ? ?4. Cough variant asthma ?Well controlled on current regimen.  Managed by specialist. ? ?5. Raynaud's disease without gangrene ?Improved.  Discussed she could add zinc or ginkgo biloba for additional support. ? ?6. Fibromyalgia ?Well controlled on current regimen.  ?- gabapentin (NEURONTIN) 300 MG capsule; Take 1 capsule (300 mg total) by mouth 3 (three) times daily.  Dispense: 90 capsule; Refill: 5 ?- tiZANidine (ZANAFLEX) 4 MG tablet; 1-2 tablets every evening  Dispense: 180 tablet; Refill: 1 ? ?7. Gastroesophageal reflux disease without esophagitis ?Well controlled on current regimen.  ? ?8. Acute non-recurrent maxillary sinusitis ?- azithromycin (ZITHROMAX Z-PAK) 250 MG tablet; Take 2 tablets (500 mg) PO today, then 1 tablet (250 mg) PO daily x4 days.  Dispense: 6 tablet; Refill: 0 ? ?9. Muscle pain ?Education provided on muscle pain.  Continue NSAID and muscle relaxer.  Encouraged to add a muscle rub. ? ? ?Follow-up: Return in about 6 weeks (around 08/27/2021) for Focusing.  ? ?[name], MSN, APRN, FNP-C ?[facility] ? ?Subjective:  ?Patient ID: Pamela Gallagher, female    DOB: [birth year]  Age: 36 y.o. MRN: [id number] ? ?Patient Care Team: ?Gwenlyn Fudge, FNP as PCP - General (Family Medicine) ?Aletha Halim, MD as Referring Physician (Specialist)  ? ?CC:  ?Chief Complaint  ?Patient presents with  ? Annual Exam  ? Cough  ? Nasal Congestion  ? facial pressure  ?  X 4 days ?  ? Shoulder Pain  ?  Left shoulder pain  that goes into neck x 5 days   ? ? ?HPI ?Pamela Gallagher presents for her annual physical.  ? ?Occupation: Therapist, sports, Marital status: single, Substance use: marijuana ?Diet: regular, Exercise: walking for work ?Last eye exam: years ago; denies vision issues ?Last dental exam: two years ago ?Last pap smear: 07/03/2021 with recommended repeat in 5 years ?Hepatitis C Screening: declined ?Immunizations: Flu Vaccine: declined ?Tdap Vaccine: up to date  ?COVID-19 Vaccine: declined ?Pneumonia Vaccine: declined ? ?Anxiety/Depression: at our last visit Zoloft was increased to 150 mg and she was started on BuSpar 7.5 mg twice daily. She reports she is feeling good. She does feel she is having a hard time focusing at work. ? ? ?  07/16/2021  ?  9:46 AM 07/03/2021  ? 11:11 AM 05/08/2021  ? 10:55 AM  ?Depression screen PHQ 2/9  ?Decreased Interest 1 1 3   ?Down, Depressed, Hopeless 1 1 3   ?PHQ - 2 Score 2 2 6   ?Altered sleeping 1 2 3   ?Tired, decreased energy 1 2 3   ?Change in appetite 0 1 3  ?Feeling bad or failure about yourself  0 0 1  ?Trouble concentrating 3 3 1   ?Moving slowly or fidgety/restless 1 0 1  ?Suicidal thoughts 0 0 0  ?PHQ-9 Score 8 10 18   ?Difficult doing work/chores Somewhat difficult  Very difficult  ? ? ?  07/16/2021  ?  9:46 AM 07/03/2021  ? 11:11 AM 05/08/2021  ? 10:57 AM 03/20/2021  ? 10:54 AM  ?GAD 7 : Generalized  Anxiety Score  ?Nervous, Anxious, on Edge 1 1 3 2   ?Control/stop worrying 1 1 3 1   ?Worry too much - different things 1 1 2 1   ?Trouble relaxing 1 3 3 2   ?Restless 0 0 1 1  ?Easily annoyed or irritable 0 1 3 1   ?Afraid - awful might happen 0 1 3 0  ?Total GAD 7 Score 4 8 18 8   ?Anxiety Difficulty Somewhat difficult  Very difficult Very difficult  ? ?Asthma: using Breztri daily and Albuterol as needed Managed by asthma and allergy, Dr. Dellis AnesGallagher.  ? ?Raynaud's disease: taking amlodipine. She reports she still has episodes that occur daily but they are not as bad.  ? ?Fibromyalgia: taking  gabapentin and tizanidine. Patient denies any issues with her current regimen. ? ?GERD: taking omeprazole and famotidine.  ? ?URI: Patient complains of cough, head congestion, headache, runny nose, sneezing, facial pain/pressure, and postnasal drainage. Onset of symptoms was 4 days ago, unchanged since that time. She is drinking plenty of fluids. Evaluation to date: none. Treatment to date: antihistamines. She has a history of asthma. She does smoke.  ? ?Shoulder Pain: Patient complaints of left shoulder pain. The pain is described as sharp.  The onset of the pain was  5 days ago .  The pain occurs continuously.  Symptoms are aggravated by all activities. She has tried Ibuprofen, Tizanidine, heating pad, and stretching which have not helpful.  ? ? ?Review of Systems  ?Constitutional:  Negative for chills, fever, malaise/fatigue and weight loss.  ?HENT:  Positive for congestion and sinus pain. Negative for ear discharge, ear pain, nosebleeds, sore throat and tinnitus.   ?Eyes:  Negative for blurred vision, double vision, pain, discharge and redness.  ?Respiratory:  Positive for cough and sputum production. Negative for shortness of breath and wheezing.   ?Cardiovascular:  Negative for chest pain, palpitations and leg swelling.  ?Gastrointestinal:  Negative for abdominal pain, constipation, diarrhea, heartburn, nausea and vomiting.  ?Genitourinary:  Negative for dysuria, frequency and urgency.  ?Musculoskeletal:  Positive for joint pain. Negative for myalgias.  ?Skin:  Negative for rash.  ?Neurological:  Positive for headaches. Negative for dizziness, seizures and weakness.  ?Psychiatric/Behavioral:  Positive for depression. Negative for substance abuse and suicidal ideas. The patient is nervous/anxious.   ? ? ?Current Outpatient Medications:  ?  albuterol (VENTOLIN HFA) 108 (90 Base) MCG/ACT inhaler, Inhale 1-2 puffs into the lungs every 6 (six) hours as needed for wheezing or shortness of breath., Disp: 18 g, Rfl:  2 ?  amLODipine (NORVASC) 5 MG tablet, Take 1 tablet (5 mg total) by mouth daily., Disp: 90 tablet, Rfl: 1 ?  Budeson-Glycopyrrol-Formoterol (BREZTRI AEROSPHERE) 160-9-4.8 MCG/ACT AERO, Inhale 2 puffs into the lungs in the morning and at bedtime. With Spacer, Disp: 10.7 g, Rfl: 5 ?  busPIRone (BUSPAR) 5 MG tablet, Take 1.5 tablets (7.5 mg total) by mouth 2 (two) times daily., Disp: 90 tablet, Rfl: 2 ?  Carbinoxamine Maleate 4 MG/5ML SOLN, Take 20 mLs (16 mg total) by mouth at bedtime., Disp: 600 mL, Rfl: 5 ?  Drospirenone (SLYND) 4 MG TABS, Take 1 tablet by mouth daily., Disp: 90 tablet, Rfl: 3 ?  famotidine (PEPCID) 20 MG tablet, Take 1 tablet (20 mg total) by mouth 2 (two) times daily. One after supper, Disp: 60 tablet, Rfl: 5 ?  gabapentin (NEURONTIN) 300 MG capsule, Take 1 capsule (300 mg total) by mouth 3 (three) times daily., Disp: 90 capsule, Rfl: 5 ?  hydrocortisone 1 %  lotion, Apply 1 application topically 2 (two) times daily., Disp: 118 mL, Rfl: 0 ?  hydrOXYzine (ATARAX) 10 MG tablet, Take 1 tablet (10 mg total) by mouth at bedtime., Disp: 60 tablet, Rfl: 1 ?  levocetirizine (XYZAL) 5 MG tablet, Take 1 tablet (5 mg total) by mouth 2 (two) times daily as needed for allergies (Can take an extra dose during flare ups.)., Disp: 180 tablet, Rfl: 1 ?  metroNIDAZOLE (METROGEL VAGINAL) 0.75 % vaginal gel, Place 1 Applicatorful vaginally at bedtime. After sex or period, Disp: 70 g, Rfl: 0 ?  mometasone-formoterol (DULERA) 100-5 MCG/ACT AERO, Take 2 puffs first thing in am and then another 2 puffs about 12 hours later., Disp: 1 each, Rfl: 2 ?  omeprazole (PRILOSEC) 20 MG capsule, TAKE 1 CAPSULE 30-60 MINUTES BEFORE FIRST MEAL OF THE DAY, Disp: 90 capsule, Rfl: 0 ?  sertraline (ZOLOFT) 100 MG tablet, Take 1.5 tablets (150 mg total) by mouth daily., Disp: 135 tablet, Rfl: 1 ?  tiZANidine (ZANAFLEX) 4 MG tablet, TAKE 1 TO 2 TABLETS BY MOUTH EVERY EVENING, Disp: 180 tablet, Rfl: 1 ? ?Allergies  ?Allergen Reactions  ?  Clindamycin/Lincomycin Shortness Of Breath  ? Penicillins Anaphylaxis  ?  Has patient had a PCN reaction causing immediate rash, facial/tongue/throat swelling, SOB or lightheadedness with hypotension: Yes ?Has p

## 2021-07-21 ENCOUNTER — Encounter: Payer: Self-pay | Admitting: Family Medicine

## 2021-07-22 ENCOUNTER — Telehealth: Payer: Self-pay | Admitting: Family Medicine

## 2021-07-22 DIAGNOSIS — M791 Myalgia, unspecified site: Secondary | ICD-10-CM

## 2021-07-22 MED ORDER — MELOXICAM 15 MG PO TABS: 15.0000 mg | ORAL_TABLET | Freq: Every day | ORAL | 0 refills | Status: DC

## 2021-07-22 NOTE — Telephone Encounter (Signed)
Pt aware - will hold on on PT for now  ?

## 2021-07-22 NOTE — Telephone Encounter (Signed)
Covering PCP- please advise  

## 2021-07-22 NOTE — Telephone Encounter (Signed)
I sent in meloxicam. Do not take other NSAIDs with meloxicam. Continue muscle relaxer that Britney prescribed. Ok to refer to PT if she would like.  ?

## 2021-07-28 ENCOUNTER — Other Ambulatory Visit: Payer: Self-pay | Admitting: Family Medicine

## 2021-07-28 DIAGNOSIS — F4323 Adjustment disorder with mixed anxiety and depressed mood: Secondary | ICD-10-CM

## 2021-08-08 ENCOUNTER — Other Ambulatory Visit: Payer: Self-pay | Admitting: Family Medicine

## 2021-08-08 ENCOUNTER — Other Ambulatory Visit: Payer: Self-pay | Admitting: Allergy & Immunology

## 2021-08-08 DIAGNOSIS — R41840 Attention and concentration deficit: Secondary | ICD-10-CM

## 2021-08-08 DIAGNOSIS — F331 Major depressive disorder, recurrent, moderate: Secondary | ICD-10-CM

## 2021-08-20 ENCOUNTER — Encounter: Payer: Self-pay | Admitting: Family Medicine

## 2021-08-21 ENCOUNTER — Ambulatory Visit (INDEPENDENT_AMBULATORY_CARE_PROVIDER_SITE_OTHER): Payer: Medicaid Other | Admitting: Nurse Practitioner

## 2021-08-21 ENCOUNTER — Encounter: Payer: Self-pay | Admitting: Nurse Practitioner

## 2021-08-21 VITALS — BP 92/63 | HR 95 | Temp 98.8°F | Ht 63.0 in | Wt 114.0 lb

## 2021-08-21 DIAGNOSIS — J0100 Acute maxillary sinusitis, unspecified: Secondary | ICD-10-CM

## 2021-08-21 MED ORDER — DOXYCYCLINE HYCLATE 100 MG PO TABS: 100.0000 mg | ORAL_TABLET | Freq: Two times a day (BID) | ORAL | 0 refills | Status: DC

## 2021-08-21 NOTE — Patient Instructions (Signed)

## 2021-08-21 NOTE — Progress Notes (Signed)
? ?Acute Office Visit ? ?Subjective:  ? ?  ?Patient ID: Pamela Gallagher, female    DOB: 27-Nov-1985, 36 y.o.   MRN: [id number] ? ?Chief Complaint  ?Patient presents with  ? Sinusitis  ?  Very heavy green / bloody mucus for 2 days now - getting worse  ?Runny nose , eyes , ear pressure , ear popping , scratchy throat , taken mucinex has not helped   ? Sinus Problem  ? ? ?Sinusitis ?This is a new problem. The current episode started in the past 7 days. The problem is unchanged. There has been no fever. The pain is moderate. Associated symptoms include congestion, headaches, sinus pressure and a sore throat. Pertinent negatives include no coughing. Past treatments include oral decongestants and saline sprays. The treatment provided no relief.  ?Sinus Problem ?This is a recurrent problem. The current episode started in the past 7 days. The problem is unchanged. There has been no fever. The pain is moderate. Associated symptoms include congestion, headaches, sinus pressure and a sore throat. Pertinent negatives include no coughing. Past treatments include oral decongestants and saline sprays. The treatment provided no relief.  ? ? ?Review of Systems  ?Constitutional:  Negative for fever.  ?HENT:  Positive for congestion, sinus pressure and sore throat.   ?Respiratory:  Negative for cough.   ?Cardiovascular: Negative.   ?Skin:  Negative for rash.  ?Neurological:  Positive for headaches.  ?Psychiatric/Behavioral: Negative.    ?All other systems reviewed and are negative. ? ? ?   ?Objective:  ?  ?BP 92/63 (BP Location: Right Arm, Patient Position: Sitting, Cuff Size: Normal)   Pulse 95   Temp 98.8 ?F (37.1 ?C) (Temporal)   Ht 5\' 3"  (1.6 m)   Wt 114 lb (51.7 kg)   LMP 08/21/2021 (Exact Date)   SpO2 99%   BMI 20.19 kg/m?  ?BP Readings from Last 3 Encounters:  ?08/21/21 92/63  ?07/16/21 107/72  ?07/03/21 117/72  ? ?Wt Readings from Last 3 Encounters:  ?08/21/21 114 lb (51.7 kg)  ?07/16/21 110 lb 3.2 oz (50 kg)  ?07/03/21  110 lb (49.9 kg)  ? ?  ? ?Physical Exam ?Vitals and nursing note reviewed.  ?Constitutional:   ?   Appearance: Normal appearance.  ?HENT:  ?   Head: Normocephalic.  ?   Right Ear: External ear normal.  ?   Left Ear: External ear normal.  ?   Nose: Congestion present.  ?Eyes:  ?   Conjunctiva/sclera: Conjunctivae normal.  ?Cardiovascular:  ?   Rate and Rhythm: Normal rate and regular rhythm.  ?   Pulses: Normal pulses.  ?   Heart sounds: Normal heart sounds.  ?Abdominal:  ?   General: Bowel sounds are normal.  ?Skin: ?   General: Skin is warm.  ?   Findings: No rash.  ?Neurological:  ?   Mental Status: She is alert and oriented to person, place, and time.  ? ? ?No results found for any visits on 08/21/21. ? ? ?   ?Assessment & Plan:  ?Take meds as prescribed ?- Use a cool mist humidifier  ?-Use saline nose sprays frequently ?-Force fluids ?-For fever or aches or pains- take Tylenol or ibuprofen. ?-If symptoms do not improve, she may need to be COVID tested to rule this out ?Follow up with worsening unresolved symptoms  ?Problem List Items Addressed This Visit   ?None ?Visit Diagnoses   ? ? Acute non-recurrent maxillary sinusitis    -  Primary  ?  Relevant Medications  ? doxycycline (VIBRA-TABS) 100 MG tablet  ? ?  ? ? ?Meds ordered this encounter  ?Medications  ? doxycycline (VIBRA-TABS) 100 MG tablet  ?  Sig: Take 1 tablet (100 mg total) by mouth 2 (two) times daily.  ?  Dispense:  14 tablet  ?  Refill:  0  ?  Order Specific Question:   Supervising Provider  ?  AnswerMechele Claude [[id number]]  ? ? ?Return if symptoms worsen or fail to improve. ? ?Daryll Drown, NP ? ? ?

## 2021-08-29 ENCOUNTER — Ambulatory Visit: Payer: Medicaid Other | Admitting: Family Medicine

## 2021-08-29 NOTE — Telephone Encounter (Signed)
No response from patient to pharmacy and unable to leave message for patient to call pharmacy due to voicemail full ?

## 2021-09-30 ENCOUNTER — Other Ambulatory Visit: Payer: Self-pay | Admitting: Family Medicine

## 2021-09-30 DIAGNOSIS — I7300 Raynaud's syndrome without gangrene: Secondary | ICD-10-CM

## 2021-10-01 ENCOUNTER — Other Ambulatory Visit: Payer: Self-pay | Admitting: Allergy & Immunology

## 2021-10-29 ENCOUNTER — Other Ambulatory Visit: Payer: Self-pay | Admitting: Family Medicine

## 2021-10-29 DIAGNOSIS — F4323 Adjustment disorder with mixed anxiety and depressed mood: Secondary | ICD-10-CM

## 2022-01-09 ENCOUNTER — Other Ambulatory Visit: Payer: Self-pay | Admitting: Family Medicine

## 2022-01-09 DIAGNOSIS — R41840 Attention and concentration deficit: Secondary | ICD-10-CM

## 2022-01-09 DIAGNOSIS — F331 Major depressive disorder, recurrent, moderate: Secondary | ICD-10-CM

## 2022-01-09 DIAGNOSIS — F4323 Adjustment disorder with mixed anxiety and depressed mood: Secondary | ICD-10-CM

## 2022-03-28 ENCOUNTER — Other Ambulatory Visit: Payer: Self-pay | Admitting: Women's Health

## 2022-03-31 ENCOUNTER — Other Ambulatory Visit (HOSPITAL_COMMUNITY)
Admission: RE | Admit: 2022-03-31 | Discharge: 2022-03-31 | Disposition: A | Discharge status: Home / Self Care | Payer: Medicaid Other | Source: Ambulatory Visit | Attending: Adult Health | Admitting: Adult Health

## 2022-03-31 ENCOUNTER — Ambulatory Visit (INDEPENDENT_AMBULATORY_CARE_PROVIDER_SITE_OTHER): Payer: Self-pay | Admitting: Adult Health

## 2022-03-31 ENCOUNTER — Encounter: Payer: Self-pay | Admitting: Adult Health

## 2022-03-31 VITALS — BP 107/72 | HR 86 | Ht 63.0 in | Wt 119.0 lb

## 2022-03-31 DIAGNOSIS — N3001 Acute cystitis with hematuria: Secondary | ICD-10-CM

## 2022-03-31 DIAGNOSIS — Z113 Encounter for screening for infections with a predominantly sexual mode of transmission: Secondary | ICD-10-CM | POA: Insufficient documentation

## 2022-03-31 DIAGNOSIS — N926 Irregular menstruation, unspecified: Secondary | ICD-10-CM | POA: Insufficient documentation

## 2022-03-31 DIAGNOSIS — R3915 Urgency of urination: Secondary | ICD-10-CM

## 2022-03-31 HISTORY — DX: Encounter for screening for infections with a predominantly sexual mode of transmission: Z11.3

## 2022-03-31 HISTORY — DX: Irregular menstruation, unspecified: N92.6

## 2022-03-31 LAB — POCT URINALYSIS DIPSTICK OB
Ketones, UA: NEGATIVE
Nitrite, UA: POSITIVE

## 2022-03-31 MED ORDER — METRONIDAZOLE 0.75 % VA GEL: 1.0000 | Freq: Every day | VAGINAL | 1 refills | Status: DC

## 2022-03-31 MED ORDER — PHENAZOPYRIDINE HCL 200 MG PO TABS: 200.0000 mg | ORAL_TABLET | Freq: Three times a day (TID) | ORAL | 0 refills | Status: DC | PRN

## 2022-03-31 MED ORDER — NITROFURANTOIN MONOHYD MACRO 100 MG PO CAPS: 100.0000 mg | ORAL_CAPSULE | Freq: Two times a day (BID) | ORAL | 0 refills | Status: DC

## 2022-03-31 NOTE — Progress Notes (Signed)
  Subjective:     Patient ID: Pamela Gallagher, female   DOB: [birth year], 36 y.o.   MRN: [id number]  HPI Pamela Gallagher is  36 year old white female,single, G1P1 in complaining of bleeding on and off for 2 months and has urinary frequency and burning.  She is self pay.   Last pap was negative HPV, NILM 07/03/21.  PCP is Pamela Boston, NP  Review of Systems Bleeding on and off for 2 months, is on Slynd +urinary frequency and burning Reviewed past medical,surgical, social and family history. Reviewed medications and allergies.     Objective:   Physical Exam BP 107/72 (BP Location: Right Arm, Patient Position: Sitting, Cuff Size: Normal)   Pulse 86   Ht 5\' 3"  (1.6 m)   Wt 119 lb (54 kg)   BMI 21.08 kg/m  Urine dipstick trace glucose,large blood,3+ protein and +nitrates and 3+leuks Skin warm and dry.Pelvic: external genitalia is normal in appearance, has small swollen and tender lymph node in left groin, vagina: pink,urethra has no lesions or masses noted, cervix:smooth and bulbous, uterus: normal size, shape and contour, non tender, no masses felt, adnexa: no masses or tenderness noted. Bladder is non tender and no masses felt. CV swab obtained.    Fall risk is low  [facility] - 03/31/22 0945       Pregnancy Intention Screening   Does the patient want to become pregnant in the next year? Ok Either Way    Does the patient's partner want to become pregnant in the next year? Ok Either Way    Would the patient like to discuss contraceptive options today? No      Contraception Wrap Up   Current Method Oral Contraceptive    End Method Oral Contraceptive    Contraception Counseling Provided No            Examination chaperoned by 14/11/23 RN  Assessment:     1. Urinary urgency   2. Irregular bleeding Continue slynd  3. Acute cystitis with hematuria Will rx Macrobid and pyridium   Meds ordered this encounter  Medications   phenazopyridine (PYRIDIUM) 200 MG tablet    Sig: Take 1  tablet (200 mg total) by mouth 3 (three) times daily as needed for pain.    Dispense:  10 tablet    Refill:  0    Order Specific Question:   Supervising Provider    Answer:   Dorthula Perfect H [[id number]]   nitrofurantoin, macrocrystal-monohydrate, (MACROBID) 100 MG capsule    Sig: Take 1 capsule (100 mg total) by mouth 2 (two) times daily.    Dispense:  14 capsule    Refill:  0    Order Specific Question:   Supervising Provider    Answer:   Duane Lope H [[id number]]   metroNIDAZOLE (METROGEL VAGINAL) 0.75 % vaginal gel    Sig: Place 1 Applicatorful vaginally at bedtime. After sex or period    Dispense:  70 g    Refill:  1    Order Specific Question:   Supervising Provider    Answer:   Duane Lope H [[id number]]   Push fluids   4. Screening examination for STD (sexually transmitted disease) CV swab sent for GC/CHL and trich,BV and yeast    Plan:     Follow up prn Note given to be out of work today

## 2022-04-02 LAB — CERVICOVAGINAL ANCILLARY ONLY
Bacterial Vaginitis (gardnerella): POSITIVE — AB
Candida Glabrata: NEGATIVE
Candida Vaginitis: NEGATIVE
Chlamydia: NEGATIVE
Comment: NEGATIVE
Comment: NEGATIVE
Comment: NEGATIVE
Comment: NEGATIVE
Comment: NEGATIVE
Comment: NORMAL
Neisseria Gonorrhea: NEGATIVE
Trichomonas: NEGATIVE

## 2022-04-28 ENCOUNTER — Other Ambulatory Visit: Payer: Self-pay | Admitting: Family Medicine

## 2022-04-28 DIAGNOSIS — F331 Major depressive disorder, recurrent, moderate: Secondary | ICD-10-CM

## 2022-06-02 ENCOUNTER — Other Ambulatory Visit: Payer: Self-pay | Admitting: Nurse Practitioner

## 2022-06-02 DIAGNOSIS — F331 Major depressive disorder, recurrent, moderate: Secondary | ICD-10-CM

## 2022-06-27 ENCOUNTER — Encounter (HOSPITAL_BASED_OUTPATIENT_CLINIC_OR_DEPARTMENT_OTHER): Payer: Self-pay

## 2022-06-27 ENCOUNTER — Emergency Department (HOSPITAL_BASED_OUTPATIENT_CLINIC_OR_DEPARTMENT_OTHER)
Admission: EM | Admit: 2022-06-27 | Discharge: 2022-06-27 | Disposition: A | Discharge status: Home / Self Care | Payer: Managed Care, Other (non HMO) | Attending: Emergency Medicine | Admitting: Emergency Medicine

## 2022-06-27 ENCOUNTER — Encounter: Payer: Managed Care, Other (non HMO) | Admitting: Family Medicine

## 2022-06-27 ENCOUNTER — Emergency Department (HOSPITAL_BASED_OUTPATIENT_CLINIC_OR_DEPARTMENT_OTHER): Payer: Managed Care, Other (non HMO)

## 2022-06-27 ENCOUNTER — Other Ambulatory Visit: Payer: Self-pay

## 2022-06-27 DIAGNOSIS — F17200 Nicotine dependence, unspecified, uncomplicated: Secondary | ICD-10-CM | POA: Insufficient documentation

## 2022-06-27 DIAGNOSIS — G8929 Other chronic pain: Secondary | ICD-10-CM | POA: Diagnosis not present

## 2022-06-27 DIAGNOSIS — R1012 Left upper quadrant pain: Secondary | ICD-10-CM | POA: Diagnosis present

## 2022-06-27 DIAGNOSIS — R000 Tachycardia, unspecified: Secondary | ICD-10-CM | POA: Insufficient documentation

## 2022-06-27 DIAGNOSIS — R112 Nausea with vomiting, unspecified: Secondary | ICD-10-CM | POA: Insufficient documentation

## 2022-06-27 LAB — URINALYSIS, ROUTINE W REFLEX MICROSCOPIC
Bilirubin Urine: NEGATIVE
Glucose, UA: NEGATIVE mg/dL
Hgb urine dipstick: NEGATIVE
Ketones, ur: NEGATIVE mg/dL
Leukocytes,Ua: NEGATIVE
Nitrite: NEGATIVE
Protein, ur: NEGATIVE mg/dL
Specific Gravity, Urine: 1.016 (ref 1.005–1.030)
pH: 6 (ref 5.0–8.0)

## 2022-06-27 LAB — COMPREHENSIVE METABOLIC PANEL
ALT: 8 U/L (ref 0–44)
AST: 15 U/L (ref 15–41)
Albumin: 4.7 g/dL (ref 3.5–5.0)
Alkaline Phosphatase: 73 U/L (ref 38–126)
Anion gap: 8 (ref 5–15)
BUN: 9 mg/dL (ref 6–20)
CO2: 28 mmol/L (ref 22–32)
Calcium: 9.5 mg/dL (ref 8.9–10.3)
Chloride: 103 mmol/L (ref 98–111)
Creatinine, Ser: 1.11 mg/dL — ABNORMAL HIGH (ref 0.44–1.00)
GFR, Estimated: >60 mL/min (ref >60)
Glucose, Bld: 81 mg/dL (ref 70–99)
Potassium: 3.2 mmol/L — ABNORMAL LOW (ref 3.5–5.1)
Sodium: 139 mmol/L (ref 135–145)
Total Bilirubin: 0.6 mg/dL (ref 0.3–1.2)
Total Protein: 7.5 g/dL (ref 6.5–8.1)

## 2022-06-27 LAB — CBC WITH DIFFERENTIAL/PLATELET
Abs Immature Granulocytes: 0.02 10*3/uL (ref 0.00–0.07)
Basophils Absolute: 0.1 10*3/uL (ref 0.0–0.1)
Basophils Relative: 1 %
Eosinophils Absolute: 0.3 10*3/uL (ref 0.0–0.5)
Eosinophils Relative: 3 %
HCT: 44.1 % (ref 36.0–46.0)
Hemoglobin: 15.3 g/dL — ABNORMAL HIGH (ref 12.0–15.0)
Immature Granulocytes: 0 %
Lymphocytes Relative: 33 %
Lymphs Abs: 3 10*3/uL (ref 0.7–4.0)
MCH: 31.4 pg (ref 26.0–34.0)
MCHC: 34.7 g/dL (ref 30.0–36.0)
MCV: 90.4 fL (ref 80.0–100.0)
Monocytes Absolute: 0.7 10*3/uL (ref 0.1–1.0)
Monocytes Relative: 7 %
Neutro Abs: 5.3 10*3/uL (ref 1.7–7.7)
Neutrophils Relative %: 56 %
Platelets: 314 10*3/uL (ref 150–400)
RBC: 4.88 MIL/uL (ref 3.87–5.11)
RDW: 12.1 % (ref 11.5–15.5)
WBC: 9.4 10*3/uL (ref 4.0–10.5)
nRBC: 0 % (ref 0.0–0.2)

## 2022-06-27 LAB — TROPONIN I (HIGH SENSITIVITY): Troponin I (High Sensitivity): <2 ng/L (ref <18)

## 2022-06-27 LAB — LIPASE, BLOOD: Lipase: 79 U/L — ABNORMAL HIGH (ref 11–51)

## 2022-06-27 LAB — PREGNANCY, URINE: Preg Test, Ur: NEGATIVE

## 2022-06-27 MED ORDER — SODIUM CHLORIDE 0.9 % IV SOLN: INTRAVENOUS | Status: DC

## 2022-06-27 MED ORDER — ONDANSETRON HCL 4 MG/2ML IJ SOLN
4.0000 mg | Freq: Once | INTRAMUSCULAR | Status: AC
  Administered 2022-06-27: 4 mg via INTRAVENOUS
  Filled 2022-06-27: qty 2

## 2022-06-27 MED ORDER — HYDROMORPHONE HCL 1 MG/ML IJ SOLN
1.0000 mg | Freq: Once | INTRAMUSCULAR | Status: AC
  Administered 2022-06-27: 1 mg via INTRAVENOUS
  Filled 2022-06-27: qty 1

## 2022-06-27 MED ORDER — IOHEXOL 300 MG/ML  SOLN
100.0000 mL | Freq: Once | INTRAMUSCULAR | Status: AC | PRN
  Administered 2022-06-27: 80 mL via INTRAVENOUS

## 2022-06-27 NOTE — Discharge Instructions (Addendum)
An appointment to follow-up with your primary care doctor.  Make an appointment to follow-up with gastroenterology information provided above.  Return for any new or worse symptoms.  I think it would be appropriate for them to do an upper endoscopy and a colonoscopy to further evaluate this long-term pain.

## 2022-06-27 NOTE — ED Notes (Signed)
Patient transported to CT 

## 2022-06-27 NOTE — ED Triage Notes (Addendum)
Patient here POV from Home.  Endorses Pain in ABD/Left Flank for Years that is typically Intermittent. Became constant Late Last Year and over the past 48 hours has become more severe.   Some N/V/D.   NAD Noted during Triage. A&Ox4. GCS 15. Ambulatory.

## 2022-06-27 NOTE — ED Provider Notes (Addendum)
[facility] Provider Note   CSN: [id number] Arrival date & time: 06/27/22  1812     History  Chief Complaint  Patient presents with   Flank Pain    Pamela Gallagher is a 37 y.o. female.  Patient with multiple complaints.  Complaints been going on since 2020.  But have gotten worse lately.  Patient with a complaint of left flank pain left upper quadrant abdominal pain left anterior chest pain and some left shoulder pain.  Associated with some nausea and vomiting and diarrhea.  Patient also states that when she eats food immediately she gets a pain sensation in her stomach.  Patient has not really been evaluated for this to date.  Patient has a history of posttraumatic stress disorder anxiety depression endometriosis history of migraines past surgical history significant for thyroglossal duct cyst.  Patient is an everyday smoker.       Home Medications Prior to Admission medications   Medication Sig Start Date End Date Taking? Authorizing Provider  amLODipine (NORVASC) 5 MG tablet TAKE 1 TABLET (5 MG TOTAL) BY MOUTH DAILY. 09/30/21   Loman Brooklyn, FNP  atomoxetine (STRATTERA) 40 MG capsule Take 1 capsule (40 mg total) by mouth daily. 08/08/21   Loman Brooklyn, FNP  Budeson-Glycopyrrol-Formoterol (BREZTRI AEROSPHERE) 160-9-4.8 MCG/ACT AERO Inhale 2 puffs into the lungs in the morning and at bedtime. With Spacer 05/27/21   Valentina Shaggy, MD  busPIRone (BUSPAR) 5 MG tablet TAKE 1.5 TABLETS (7.5 MG TOTAL) BY MOUTH 2 (TWO) TIMES DAILY. 07/29/21   Loman Brooklyn, FNP  Carbinoxamine Maleate 4 MG/5ML SOLN Take 20 mLs (16 mg total) by mouth at bedtime. 05/27/21   Valentina Shaggy, MD  doxycycline (VIBRA-TABS) 100 MG tablet Take 1 tablet (100 mg total) by mouth 2 (two) times daily. Patient not taking: Reported on 03/31/2022 08/21/21   Ivy Lynn, NP  Drospirenone (SLYND) 4 MG TABS TAKE ONE (1) TABLET BY MOUTH DAILY. 03/31/22   Roma Schanz, CNM  famotidine (PEPCID) 20 MG tablet Take 1 tablet (20 mg total) by mouth 2 (two) times daily. One after supper 05/27/21   Valentina Shaggy, MD  gabapentin (NEURONTIN) 300 MG capsule Take 1 capsule (300 mg total) by mouth 3 (three) times daily. 07/16/21   Loman Brooklyn, FNP  hydrocortisone 1 % lotion Apply 1 application topically 2 (two) times daily. 03/11/21   Ivy Lynn, NP  hydrOXYzine (ATARAX) 10 MG tablet TAKE 1 TABLET BY MOUTH EVERYDAY AT BEDTIME 08/08/21   Valentina Shaggy, MD  levocetirizine (XYZAL) 5 MG tablet Take 1 tablet (5 mg total) by mouth 2 (two) times daily as needed for allergies (Can take an extra dose during flare ups.). 05/27/21   Valentina Shaggy, MD  meloxicam (MOBIC) 15 MG tablet Take 1 tablet (15 mg total) by mouth daily. Patient not taking: Reported on 03/31/2022 07/22/21   Gwenlyn Perking, FNP  metroNIDAZOLE (METROGEL VAGINAL) 0.75 % vaginal gel Place 1 Applicatorful vaginally at bedtime. After sex or period 03/31/22   Estill Dooms, NP  mometasone-formoterol [facility]) 100-5 MCG/ACT AERO Take 2 puffs first thing in am and then another 2 puffs about 12 hours later. Patient not taking: Reported on 03/31/2022 09/06/20   Tanda Rockers, MD  nitrofurantoin, macrocrystal-monohydrate, (MACROBID) 100 MG capsule Take 1 capsule (100 mg total) by mouth 2 (two) times daily. 03/31/22   Estill Dooms, NP  omeprazole (PRILOSEC) 20 MG  capsule TAKE 1 CAPSULE 30-60 MINUTES BEFORE FIRST MEAL OF THE DAY 12/30/20   Tanda Rockers, MD  phenazopyridine (PYRIDIUM) 200 MG tablet Take 1 tablet (200 mg total) by mouth 3 (three) times daily as needed for pain. 03/31/22   Estill Dooms, NP  sertraline (ZOLOFT) 100 MG tablet Take 1.5 tablets (150 mg total) by mouth daily. (NEEDS TO BE SEEN BEFORE NEXT REFILL) 04/29/22   Chevis Pretty, FNP  tiZANidine (ZANAFLEX) 4 MG tablet 1-2 tablets every evening 07/16/21   Loman Brooklyn, FNP      Allergies     Clindamycin/lincomycin, Penicillins, and Sulfa antibiotics    Review of Systems   Review of Systems  Constitutional:  Negative for chills and fever.  HENT:  Negative for ear pain and sore throat.   Eyes:  Negative for pain and visual disturbance.  Respiratory:  Negative for cough and shortness of breath.   Cardiovascular:  Negative for chest pain and palpitations.  Gastrointestinal:  Positive for abdominal pain, diarrhea, nausea and vomiting.  Genitourinary:  Positive for flank pain. Negative for dysuria and hematuria.  Musculoskeletal:  Negative for arthralgias and back pain.  Skin:  Negative for color change and rash.  Neurological:  Negative for seizures and syncope.  All other systems reviewed and are negative.   Physical Exam Updated Vital Signs BP 102/82   Pulse 85   Temp 98.7 F (37.1 C) (Oral)   Resp 16   Ht 1.6 m ('5\' 3"'$ )   Wt 54 kg   SpO2 97%   BMI 21.09 kg/m  Physical Exam Vitals and nursing note reviewed.  Constitutional:      General: She is not in acute distress.    Appearance: Normal appearance. She is well-developed. She is not ill-appearing.  HENT:     Head: Normocephalic and atraumatic.  Eyes:     Extraocular Movements: Extraocular movements intact.     Conjunctiva/sclera: Conjunctivae normal.     Pupils: Pupils are equal, round, and reactive to light.  Cardiovascular:     Rate and Rhythm: Normal rate and regular rhythm.     Heart sounds: No murmur heard. Pulmonary:     Effort: Pulmonary effort is normal. No respiratory distress.     Breath sounds: Normal breath sounds.  Abdominal:     Palpations: Abdomen is soft.     Tenderness: There is no abdominal tenderness.  Musculoskeletal:        General: No swelling.     Cervical back: Normal range of motion and neck supple.  Skin:    General: Skin is warm and dry.     Capillary Refill: Capillary refill takes less than 2 seconds.  Neurological:     General: No focal deficit present.     Mental  Status: She is alert and oriented to person, place, and time.  Psychiatric:        Mood and Affect: Mood normal.     ED Results / Procedures / Treatments   Labs (all labs ordered are listed, but only abnormal results are displayed) Labs Reviewed  CBC WITH DIFFERENTIAL/PLATELET - Abnormal; Notable for the following components:      Result Value   Hemoglobin 15.3 (*)    All other components within normal limits  COMPREHENSIVE METABOLIC PANEL - Abnormal; Notable for the following components:   Potassium 3.2 (*)    Creatinine, Ser 1.11 (*)    All other components within normal limits  LIPASE, BLOOD - Abnormal; Notable for the  following components:   Lipase 79 (*)    All other components within normal limits  URINALYSIS, ROUTINE W REFLEX MICROSCOPIC  PREGNANCY, URINE  TROPONIN I (HIGH SENSITIVITY)    EKG EKG Interpretation  Date/Time:  Friday June 27 2022 18:27:33 EST Ventricular Rate:  101 PR Interval:  106 QRS Duration: 83 QT Interval:  341 QTC Calculation: 442 R Axis:   86 Text Interpretation: Sinus tachycardia Confirmed by Fredia Sorrow [phone]) on 06/27/2022 7:01:27 PM  Radiology DG Chest Port 1 View  Result Date: 06/27/2022 CLINICAL DATA:  Left upper quadrant pain and left-sided chest pain. EXAM: PORTABLE CHEST 1 VIEW COMPARISON:  June 26, 2020 FINDINGS: The heart size and mediastinal contours are within normal limits. Both lungs are clear. The visualized skeletal structures are unremarkable. IMPRESSION: No active disease. Electronically Signed   By: Virgina Norfolk M.D.   On: 06/27/2022 20:19    Procedures Procedures    Medications Ordered in ED Medications  0.9 %  sodium chloride infusion ( Intravenous New Bag/Given 06/27/22 1929)  ondansetron (ZOFRAN) injection 4 mg (4 mg Intravenous Given 06/27/22 1932)  ondansetron (ZOFRAN) injection 4 mg (4 mg Intravenous Given 06/27/22 2230)  HYDROmorphone (DILAUDID) injection 1 mg (1 mg Intravenous Given 06/27/22 2228)  iohexol  (OMNIPAQUE) 300 MG/ML solution 100 mL (80 mLs Intravenous Contrast Given 06/27/22 2244)    ED Course/ Medical Decision Making/ A&P                             Medical Decision Making Amount and/or Complexity of Data Reviewed Labs: ordered. Radiology: ordered.  Risk Prescription drug management.   Patient was multiple complaints.  Will throw a big net and see if we can figure out what is going on.  Will get chest x-ray will get cardiac markers will get CBC complete metabolic panel will get lipase urinalysis pregnancy test and CT abdomen pelvis.  EKG with a sinus tachycardia.  Patient CBC no leukocytosis hemoglobin 15.3 very reassuring.  Patient's complete metabolic panel potassium down a little bit at 3.2 creatinine 1.1 GFR those greater than 60 LFTs are normal.  Blood sugar normal.  Patient's lipase is elevated a little bit at 79.  Troponin less than 2 does not need delta troponin because these chest discomfort symptoms been going on for a long period of time.  Urinalysis negative and pregnancy test is negative.  Chest x-ray had no active disease.  CT abdomen and pelvis is pending.  Disposition based on the results of that.  CT scan abdomen pelvis without any acute findings.  This seems to be consistent with chronic abdominal pain and it would be unusual to have ulcer disease for 4 years.  But would recommend referral to gastroenterology for consideration for upper endoscopy and colonoscopy and and referral back to primary care doctor.  Patient is already on gabapentin.  Would recommend taking extra strength Tylenol as needed as well.  Patient nontoxic no acute distress.   Final Clinical Impression(s) / ED Diagnoses Final diagnoses:  Chronic abdominal pain    Rx / DC Orders ED Discharge Orders     None         Fredia Sorrow, MD 06/27/22 Docia Chuck    Fredia Sorrow, MD 06/27/22 [id number]    Fredia Sorrow, MD 06/27/22 2324

## 2022-07-02 NOTE — Progress Notes (Signed)
Error- disregard. Patient not seen.

## 2022-08-03 ENCOUNTER — Other Ambulatory Visit: Payer: Self-pay | Admitting: Family Medicine

## 2022-08-03 DIAGNOSIS — M797 Fibromyalgia: Secondary | ICD-10-CM

## 2022-08-11 ENCOUNTER — Other Ambulatory Visit: Payer: Self-pay | Admitting: Family Medicine

## 2022-08-11 ENCOUNTER — Other Ambulatory Visit: Payer: Self-pay | Admitting: Allergy & Immunology

## 2022-08-11 DIAGNOSIS — F331 Major depressive disorder, recurrent, moderate: Secondary | ICD-10-CM

## 2022-08-11 DIAGNOSIS — M797 Fibromyalgia: Secondary | ICD-10-CM

## 2022-08-11 DIAGNOSIS — R112 Nausea with vomiting, unspecified: Secondary | ICD-10-CM

## 2022-08-14 ENCOUNTER — Ambulatory Visit: Payer: Managed Care, Other (non HMO) | Admitting: Family Medicine

## 2022-08-14 VITALS — BP 107/74 | HR 82 | Temp 98.7°F | Ht 63.0 in | Wt 122.6 lb

## 2022-08-14 DIAGNOSIS — F331 Major depressive disorder, recurrent, moderate: Secondary | ICD-10-CM | POA: Diagnosis not present

## 2022-08-14 DIAGNOSIS — R1012 Left upper quadrant pain: Secondary | ICD-10-CM

## 2022-08-14 DIAGNOSIS — L508 Other urticaria: Secondary | ICD-10-CM | POA: Diagnosis not present

## 2022-08-14 DIAGNOSIS — Z793 Long term (current) use of hormonal contraceptives: Secondary | ICD-10-CM

## 2022-08-14 DIAGNOSIS — R400 Somnolence: Secondary | ICD-10-CM

## 2022-08-14 DIAGNOSIS — F320 Major depressive disorder, single episode, mild: Secondary | ICD-10-CM

## 2022-08-14 DIAGNOSIS — Z136 Encounter for screening for cardiovascular disorders: Secondary | ICD-10-CM

## 2022-08-14 DIAGNOSIS — M797 Fibromyalgia: Secondary | ICD-10-CM

## 2022-08-14 DIAGNOSIS — R41840 Attention and concentration deficit: Secondary | ICD-10-CM

## 2022-08-14 DIAGNOSIS — J45991 Cough variant asthma: Secondary | ICD-10-CM

## 2022-08-14 DIAGNOSIS — F419 Anxiety disorder, unspecified: Secondary | ICD-10-CM

## 2022-08-14 DIAGNOSIS — R110 Nausea: Secondary | ICD-10-CM

## 2022-08-14 MED ORDER — ONDANSETRON HCL 4 MG PO TABS: 4.0000 mg | ORAL_TABLET | Freq: Three times a day (TID) | ORAL | 0 refills | Status: DC | PRN

## 2022-08-14 MED ORDER — SERTRALINE HCL 100 MG PO TABS: 150.0000 mg | ORAL_TABLET | Freq: Every day | ORAL | 1 refills | Status: DC

## 2022-08-14 MED ORDER — TIZANIDINE HCL 4 MG PO TABS: ORAL_TABLET | ORAL | 0 refills | Status: DC

## 2022-08-14 MED ORDER — GABAPENTIN 300 MG PO CAPS: 300.0000 mg | ORAL_CAPSULE | Freq: Three times a day (TID) | ORAL | 0 refills | Status: DC

## 2022-08-14 MED ORDER — CARBINOXAMINE MALEATE 4 MG/5ML PO SOLN: 20.0000 mL | Freq: Every evening | ORAL | 1 refills | Status: DC
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
## 2022-09-08 ENCOUNTER — Other Ambulatory Visit: Payer: Self-pay | Admitting: Women's Health

## 2022-09-08 ENCOUNTER — Other Ambulatory Visit: Payer: Self-pay | Admitting: Family Medicine

## 2022-09-08 DIAGNOSIS — M797 Fibromyalgia: Secondary | ICD-10-CM

## 2022-09-09 ENCOUNTER — Other Ambulatory Visit: Payer: Self-pay | Admitting: Family Medicine

## 2022-09-09 DIAGNOSIS — M797 Fibromyalgia: Secondary | ICD-10-CM

## 2022-09-16 ENCOUNTER — Ambulatory Visit (INDEPENDENT_AMBULATORY_CARE_PROVIDER_SITE_OTHER): Payer: Managed Care, Other (non HMO) | Admitting: Family Medicine

## 2022-09-16 ENCOUNTER — Encounter: Payer: Self-pay | Admitting: Family Medicine

## 2022-09-16 VITALS — BP 128/86 | HR 94 | Ht 63.0 in | Wt 124.0 lb

## 2022-09-16 DIAGNOSIS — F331 Major depressive disorder, recurrent, moderate: Secondary | ICD-10-CM | POA: Diagnosis not present

## 2022-09-16 DIAGNOSIS — R000 Tachycardia, unspecified: Secondary | ICD-10-CM | POA: Diagnosis not present

## 2022-09-16 DIAGNOSIS — F4323 Adjustment disorder with mixed anxiety and depressed mood: Secondary | ICD-10-CM | POA: Diagnosis not present

## 2022-09-16 DIAGNOSIS — R1012 Left upper quadrant pain: Secondary | ICD-10-CM

## 2022-09-16 DIAGNOSIS — F419 Anxiety disorder, unspecified: Secondary | ICD-10-CM

## 2022-09-16 MED ORDER — BUSPIRONE HCL 5 MG PO TABS: 10.0000 mg | ORAL_TABLET | Freq: Two times a day (BID) | ORAL | 0 refills | Status: DC

## 2022-09-16 MED ORDER — SERTRALINE HCL 100 MG PO TABS: 200.0000 mg | ORAL_TABLET | Freq: Every day | ORAL | 0 refills | Status: DC

## 2022-09-16 NOTE — Progress Notes (Signed)
Acute Office Visit  Subjective:  Patient ID: Pamela Gallagher, female    DOB: 1985-05-04, 37 y.o.   MRN: [id number]  Chief Complaint  Patient presents with   Abdominal Pain    Left sided. H/O endometriosis. Pt states that she is back to seeing her original GYN [employer] [employer] probs).     Abdominal Pain   Depression/Anxiety  Patient is in today for follow up of conditions. States that she is stressed out with work. States that she is not depressed, but has a lot of anxiety. Reports that her child's father gets out of prison in 15 days and she is nervous about that. Denies SI. Has protective factor of her daughter. States that she has had "bottoms" of her depression where she cannot get out of bed for several days. During those times she calls her aunt to take care of her aunt while she takes care of herself.  States that she previously on Xanax and she wanted to stop taking it. She self-admitted to hospital to get herself off medication.   Abdominal pain  She is following with Gynecology. She is following with Osborn Coho at [facility]. She is starting new medication after next cycle for endometriosis. She is trying to put off doing surgery because of her work stress.   Review of Systems  Gastrointestinal:  Positive for abdominal pain.   As per HPI  Objective:  BP 128/86   Pulse (!) 106   Ht 5\' 3"  (1.6 m)   Wt 124 lb (56.2 kg)   SpO2 98%   BMI 21.97 kg/m     09/16/2022    4:53 PM 09/16/2022    4:14 PM 08/14/2022   10:13 AM  Vitals with BMI  Height  5\' 3"  5\' 3"   Weight  124 lbs 122 lbs 10 oz  BMI  21.97 21.72  Systolic  128 107  Diastolic  86 74  Pulse 94 106 82     Physical Exam Constitutional:      General: She is awake. She is not in acute distress.    Appearance: Normal appearance. She is well-developed and well-groomed. She is not ill-appearing, toxic-appearing or diaphoretic.  Cardiovascular:     Rate and Rhythm: Normal rate.     Pulses: Normal pulses.           Radial pulses are 2+ on the right side and 2+ on the left side.       Posterior tibial pulses are 2+ on the right side and 2+ on the left side.     Heart sounds: Normal heart sounds. No murmur heard.    No gallop.  Pulmonary:     Effort: Pulmonary effort is normal. No respiratory distress.     Breath sounds: Normal breath sounds. No stridor. No wheezing, rhonchi or rales.  Musculoskeletal:     Cervical back: Full passive range of motion without pain and neck supple.     Right lower leg: No edema.     Left lower leg: No edema.  Skin:    General: Skin is warm.     Capillary Refill: Capillary refill takes less than 2 seconds.  Neurological:     General: No focal deficit present.     Mental Status: She is alert, oriented to person, place, and time and easily aroused. Mental status is at baseline.     GCS: GCS eye subscore is 4. GCS verbal subscore is 5. GCS motor subscore is 6.  Motor: No weakness.  Psychiatric:        Attention and Perception: Attention and perception normal.        Mood and Affect: Mood and affect normal.        Speech: Speech normal.        Behavior: Behavior normal. Behavior is cooperative.        Thought Content: Thought content normal. Thought content does not include homicidal or suicidal ideation. Thought content does not include homicidal or suicidal plan.        Cognition and Memory: Cognition and memory normal.        Judgment: Judgment normal.       08/14/2022   10:17 AM 08/21/2021    3:25 PM 07/16/2021    9:46 AM  Depression screen PHQ 2/9  Decreased Interest 1 1 1   Down, Depressed, Hopeless 1 1 1   PHQ - 2 Score 2 2 2   Altered sleeping 2 1 1   Tired, decreased energy 2 1 1   Change in appetite 2 0 0  Feeling bad or failure about yourself  0 0 0  Trouble concentrating 2 0 3  Moving slowly or fidgety/restless 0 0 1  Suicidal thoughts  0 0  PHQ-9 Score 10 4 8   Difficult doing work/chores Very difficult Somewhat difficult Somewhat difficult       08/14/2022   10:18 AM 08/21/2021    3:26 PM 07/16/2021    9:46 AM 07/03/2021   11:11 AM  GAD 7 : Generalized Anxiety Score  Nervous, Anxious, on Edge 2 1 1 1   Control/stop worrying 1 1 1 1   Worry too much - different things 1 1 1 1   Trouble relaxing 3 0 1 3  Restless 1 0 0 0  Easily annoyed or irritable 2 1 0 1  Afraid - awful might happen 0 0 0 1  Total GAD 7 Score 10 4 4 8   Anxiety Difficulty Very difficult Somewhat difficult Somewhat difficult     Assessment & Plan:  1. Recurrent moderate major depressive disorder with anxiety [facility]) Patient has significant anxiety. Will increase to max dose of sertraline as below. If continues to not improve will need to follow up with psych. Denies SI. Open to counseling. Patient and/or legal guardian verbally consented to [facility] services about presenting concerns and psychiatric consultation as appropriate.  The services will be billed as appropriate for the patient. - sertraline (ZOLOFT) 100 MG tablet; Take 2 tablets (200 mg total) by mouth daily. (NEEDS TO BE SEEN BEFORE NEXT REFILL)  Dispense: 60 tablet; Refill: 0 - Ambulatory referral to Integrated Behavioral Health  2. Situational mixed anxiety and depressive disorder Will increase buspar as below. Patient has significant situational anxiety with single motherhood, care of elderly grandmother, and father of child in prison  - busPIRone (BUSPAR) 5 MG tablet; Take 2 tablets (10 mg total) by mouth 2 (two) times daily.  Dispense: 120 tablet; Refill: 0  3. Tachycardia Tachycardia improved during visit. EKG 07/01/22 with ST and no other significant abnormalities. Labs previously ordered. Patient to complete when fasting.  If nothing significant on labs can follow up with zio.   4. Left upper quadrant abdominal pain Patient is following with Gyn for treatment of endometriosis. She is set to start a new medication once she completes her cycle. Requested records today.    The above assessment and management plan was discussed with the patient. The patient verbalized understanding of and has agreed to the management  plan using shared-decision making. Patient is aware to call the clinic if they develop any new symptoms or if symptoms fail to improve or worsen. Patient is aware when to return to the clinic for a follow-up visit. Patient educated on when it is appropriate to go to the emergency department.   Return in about 6 weeks (around 10/28/2022) for Chronic Condition Follow up.  Neale Burly, DNP-FNP [facility] [address] [place], [place] [address] [phone]

## 2022-09-18 ENCOUNTER — Other Ambulatory Visit: Payer: Self-pay | Admitting: Family Medicine

## 2022-09-18 DIAGNOSIS — L508 Other urticaria: Secondary | ICD-10-CM

## 2022-09-19 ENCOUNTER — Encounter: Payer: Self-pay | Admitting: Family Medicine

## 2022-10-11 ENCOUNTER — Other Ambulatory Visit: Payer: Self-pay | Admitting: Family Medicine

## 2022-10-11 DIAGNOSIS — F4323 Adjustment disorder with mixed anxiety and depressed mood: Secondary | ICD-10-CM

## 2022-10-15 ENCOUNTER — Encounter: Payer: Self-pay | Admitting: Family Medicine

## 2022-10-15 ENCOUNTER — Ambulatory Visit (INDEPENDENT_AMBULATORY_CARE_PROVIDER_SITE_OTHER): Payer: Managed Care, Other (non HMO) | Admitting: Family Medicine

## 2022-10-15 VITALS — BP 111/76 | HR 88 | Temp 98.5°F | Ht 63.0 in | Wt 122.0 lb

## 2022-10-15 DIAGNOSIS — R112 Nausea with vomiting, unspecified: Secondary | ICD-10-CM

## 2022-10-15 DIAGNOSIS — R21 Rash and other nonspecific skin eruption: Secondary | ICD-10-CM | POA: Diagnosis not present

## 2022-10-15 MED ORDER — PREDNISONE 20 MG PO TABS: 40.0000 mg | ORAL_TABLET | Freq: Every day | ORAL | 0 refills | Status: AC

## 2022-10-15 NOTE — Patient Instructions (Signed)
Malachi Bonds, MD [facility]

## 2022-10-15 NOTE — Progress Notes (Signed)
Acute Office Visit  Subjective:  Patient ID: Pamela Gallagher, female    DOB: [birth year], 37 y.o.   MRN: [id number]  Chief Complaint  Patient presents with   Insect Bite    Emesis, diarrhea, headache, itching x 2 days    HPI Patient is in today for emesis, diarrhea and headaches.  [place] States that she noticed a single insect bite on her left leg and was bleeding. Then she woke up with several bites all over her legs and arms. She is is using hydrocortisone cream, benadryl, and she is still taking her hydroxyzine. Started having N/V yesterday 6/25. Denies fever. Endorses fatigue and shakiness. States she cannot sleep because of the itching. She is having cold sweats. Does not believe that it was a tick bite. Abstinence since last period.   ROS As per HPI  Objective:  BP 111/76   Pulse 88   Temp 98.5 F (36.9 C)   Ht 5\' 3"  (1.6 m)   Wt 122 lb (55.3 kg)   SpO2 99%   BMI 21.61 kg/m  LMP 6/14-6/18.    Physical Exam Constitutional:      General: She is awake. She is not in acute distress.    Appearance: Normal appearance. She is well-developed and well-groomed. She is ill-appearing. She is not toxic-appearing or diaphoretic.  Cardiovascular:     Rate and Rhythm: Normal rate.     Pulses: Normal pulses.          Radial pulses are 2+ on the right side and 2+ on the left side.       Posterior tibial pulses are 2+ on the right side and 2+ on the left side.     Heart sounds: Normal heart sounds. No murmur heard.    No gallop.  Pulmonary:     Effort: Pulmonary effort is normal. No respiratory distress.     Breath sounds: Normal breath sounds. No stridor. No wheezing, rhonchi or rales.  Musculoskeletal:     Cervical back: Full passive range of motion without pain and neck supple.     Right lower leg: No edema.     Left lower leg: No edema.  Skin:    General: Skin is warm.     Capillary Refill: Capillary refill takes less than 2 seconds.      Coloration: Skin is pale.     Findings: Rash present.     Comments: Sporadic, Diffuse, erythematous, vesicular lesions along bilateral legs, wrists, and trunk  Neurological:     General: No focal deficit present.     Mental Status: She is oriented to person, place, and time and easily aroused. Mental status is at baseline. She is lethargic.     GCS: GCS eye subscore is 4. GCS verbal subscore is 5. GCS motor subscore is 6.     Motor: No weakness.  Psychiatric:        Attention and Perception: Attention and perception normal.        Mood and Affect: Mood and affect normal.        Speech: Speech normal.        Behavior: Behavior normal. Behavior is cooperative.        Thought Content: Thought content normal. Thought content does not include homicidal or suicidal ideation. Thought content does not include homicidal or suicidal plan.        Cognition and Memory: Cognition and memory normal.  Judgment: Judgment normal.        Assessment & Plan:  1. Rash Will start medication as below. Discussed with patient not to take benadryl with hydroxyzine. Instructed patient to follow up with asthma and allergy and [facility] if she does not have improvement.  - predniSONE (DELTASONE) 20 MG tablet; Take 2 tablets (40 mg total) by mouth daily with breakfast for 5 days.  Dispense: 10 tablet; Refill: 0  2. Nausea and vomiting in adult Discussed rehydration. Reviewed signs of systemic infection. Minimal concern for tick borne disease as patient did not pull off tick or notice bite.    The above assessment and management plan was discussed with the patient. The patient verbalized understanding of and has agreed to the management plan using shared-decision making. Patient is aware to call the clinic if they develop any new symptoms or if symptoms fail to improve or worsen. Patient is aware when to return to the clinic for a follow-up visit. Patient educated on when it is appropriate to go to the emergency  department.   Return if symptoms worsen or fail to improve.  Neale Burly, DNP-FNP [facility] [address] [place], [place] [address] [phone]

## 2022-10-28 ENCOUNTER — Encounter: Payer: Self-pay | Admitting: Family Medicine

## 2022-10-28 ENCOUNTER — Ambulatory Visit: Payer: Managed Care, Other (non HMO) | Admitting: Family Medicine

## 2022-10-28 VITALS — BP 94/68 | HR 97 | Temp 98.7°F | Ht 63.0 in | Wt 125.0 lb

## 2022-10-28 DIAGNOSIS — F331 Major depressive disorder, recurrent, moderate: Secondary | ICD-10-CM | POA: Diagnosis not present

## 2022-10-28 DIAGNOSIS — F419 Anxiety disorder, unspecified: Secondary | ICD-10-CM

## 2022-10-28 DIAGNOSIS — F410 Panic disorder [episodic paroxysmal anxiety] without agoraphobia: Secondary | ICD-10-CM | POA: Diagnosis not present

## 2022-10-28 DIAGNOSIS — L508 Other urticaria: Secondary | ICD-10-CM | POA: Diagnosis not present

## 2022-10-28 MED ORDER — CARBINOXAMINE MALEATE 4 MG/5ML PO SOLN: 20.0000 mL | Freq: Every evening | ORAL | 0 refills | Status: DC

## 2022-10-28 NOTE — Progress Notes (Signed)
Acute Office Visit  Subjective:  Patient ID: Pamela Gallagher, female    DOB: Jun 07, 1985, 37 y.o.   MRN: [id number]  Chief Complaint  Patient presents with   Medical Management of Chronic Issues    Mood disorders    HPI Patient is in today for anxiety and depression  States that she is about the same. She was previously referred to [facility] and is trying to get scheduled. She is still stressed out with work. She is providing rides for her daughter's father and he has moved in.   States that she is still not sleeping well and has been irritable. She is working for full custody for her daughter and that is a stressor for her.   ROS As per HPI  Objective:  BP 94/68   Pulse 97   Temp 98.7 F (37.1 C)   Ht 5\' 3"  (1.6 m)   Wt 125 lb (56.7 kg)   SpO2 97%   BMI 22.14 kg/m   Physical Exam Constitutional:      General: She is awake. She is not in acute distress.    Appearance: Normal appearance. She is well-developed and well-groomed. She is not ill-appearing, toxic-appearing or diaphoretic.  Cardiovascular:     Rate and Rhythm: Normal rate.     Pulses: Normal pulses.          Radial pulses are 2+ on the right side and 2+ on the left side.       Posterior tibial pulses are 2+ on the right side and 2+ on the left side.     Heart sounds: Normal heart sounds. No murmur heard.    No gallop.  Pulmonary:     Effort: Pulmonary effort is normal. No respiratory distress.     Breath sounds: Normal breath sounds. No stridor. No wheezing, rhonchi or rales.  Musculoskeletal:     Cervical back: Full passive range of motion without pain and neck supple.     Right lower leg: No edema.     Left lower leg: No edema.  Skin:    General: Skin is warm.     Capillary Refill: Capillary refill takes less than 2 seconds.  Neurological:     General: No focal deficit present.     Mental Status: She is alert, oriented to person, place, and time and easily aroused. Mental status is at baseline.      GCS: GCS eye subscore is 4. GCS verbal subscore is 5. GCS motor subscore is 6.     Motor: No weakness.  Psychiatric:        Attention and Perception: Attention and perception normal.        Mood and Affect: Mood and affect normal.        Speech: Speech normal.        Behavior: Behavior normal. Behavior is cooperative.        Thought Content: Thought content normal. Thought content does not include homicidal or suicidal ideation. Thought content does not include homicidal or suicidal plan.        Cognition and Memory: Cognition and memory normal.        Judgment: Judgment normal.       10/28/2022    3:11 PM 09/16/2022    4:45 PM 08/14/2022   10:17 AM  Depression screen PHQ 2/9  Decreased Interest 1 1 1   Down, Depressed, Hopeless 1 1 1   PHQ - 2 Score 2 2 2   Altered sleeping 2 1 2  Tired, decreased energy 1 1 2   Change in appetite 1 1 2   Feeling bad or failure about yourself  0 0 0  Trouble concentrating 1 1 2   Moving slowly or fidgety/restless 0 1 0  Suicidal thoughts 0 0   PHQ-9 Score 7 7 10   Difficult doing work/chores Very difficult Very difficult Very difficult      10/28/2022    3:12 PM 09/16/2022    4:48 PM 08/14/2022   10:18 AM 08/21/2021    3:26 PM  GAD 7 : Generalized Anxiety Score  Nervous, Anxious, on Edge 2 3 2 1   Control/stop worrying 2 3 1 1   Worry too much - different things 1 2 1 1   Trouble relaxing 2 2 3  0  Restless 1 2 1  0  Easily annoyed or irritable 2 3 2 1   Afraid - awful might happen 0 0 0 0  Total GAD 7 Score 10 15 10 4   Anxiety Difficulty Very difficult Very difficult Very difficult Somewhat difficult   Assessment & Plan:  1. Recurrent moderate major depressive disorder with anxiety (HCC) Well controlled on current regimen. Does not wish to change dose of medications. Patient to try to schedule with [facility]. Safety contract established. Denies SI.   2. Panic attacks Well controlled on current regimen. Does not wish to change dose of medications.  Patient to try to schedule with [facility]. Safety contract established. Denies SI.   3. Chronic urticaria Will refill as below. Reviewed note from [facility], Gallagher, 05/27/21. Instructed patient to follow up with [facility]ergy [facility] Asthma. Per note, patient was supposed to follow up March 2023.  - Carbinoxamine Maleate 4 MG/5ML SOLN; Take 20 mLs (16 mg total) by mouth at bedtime.  Dispense: 600 mL; Refill: 0  The above assessment and management plan was discussed with the patient. The patient verbalized understanding of and has agreed to the management plan using shared-decision making. Patient is aware to call the clinic if they develop any new symptoms or if symptoms fail to improve or worsen. Patient is aware when to return to the clinic for a follow-up visit. Patient educated on when it is appropriate to go to the emergency department.   Return in about 6 months (around 04/30/2023) for Chronic Condition Follow up.  Neale Burly, DNP-FNP [facility] [address] [place], [place] [address] [phone]

## 2022-10-29 LAB — CMP14+EGFR
ALT: 13 IU/L (ref 0–32)
AST: 18 IU/L (ref 0–40)
Albumin: 4.6 g/dL (ref 3.9–4.9)
Alkaline Phosphatase: 100 IU/L (ref 44–121)
BUN/Creatinine Ratio: 16 (ref 9–23)
BUN: 15 mg/dL (ref 6–20)
Bilirubin Total: 0.4 mg/dL (ref 0.0–1.2)
CO2: 24 mmol/L (ref 20–29)
Calcium: 9.6 mg/dL (ref 8.7–10.2)
Chloride: 98 mmol/L (ref 96–106)
Creatinine, Ser: 0.91 mg/dL (ref 0.57–1.00)
Globulin, Total: 2.7 g/dL (ref 1.5–4.5)
Glucose: 81 mg/dL (ref 70–99)
Potassium: 3.7 mmol/L (ref 3.5–5.2)
Sodium: 137 mmol/L (ref 134–144)
Total Protein: 7.3 g/dL (ref 6.0–8.5)
eGFR: 83 mL/min/{1.73_m2} (ref >59)

## 2022-10-29 LAB — ANEMIA PROFILE B
Basophils Absolute: 0.1 10*3/uL (ref 0.0–0.2)
Basos: 1 %
EOS (ABSOLUTE): 0.3 10*3/uL (ref 0.0–0.4)
Eos: 3 %
Ferritin: 85 ng/mL (ref 15–150)
Folate: 4.7 ng/mL (ref >3.0)
Hematocrit: 45.2 % (ref 34.0–46.6)
Hemoglobin: 15.5 g/dL (ref 11.1–15.9)
Immature Grans (Abs): 0 10*3/uL (ref 0.0–0.1)
Immature Granulocytes: 0 %
Iron Saturation: 27 % (ref 15–55)
Iron: 102 ug/dL (ref 27–159)
Lymphocytes Absolute: 3.4 10*3/uL — ABNORMAL HIGH (ref 0.7–3.1)
Lymphs: 32 %
MCH: 31.1 pg (ref 26.6–33.0)
MCHC: 34.3 g/dL (ref 31.5–35.7)
MCV: 91 fL (ref 79–97)
Monocytes Absolute: 0.7 10*3/uL (ref 0.1–0.9)
Monocytes: 6 %
Neutrophils Absolute: 6.2 10*3/uL (ref 1.4–7.0)
Neutrophils: 58 %
Platelets: 318 10*3/uL (ref 150–450)
RBC: 4.98 x10E6/uL (ref 3.77–5.28)
RDW: 12.2 % (ref 11.7–15.4)
Retic Ct Pct: 1.4 % (ref 0.6–2.6)
Total Iron Binding Capacity: 379 ug/dL (ref 250–450)
UIBC: 277 ug/dL (ref 131–425)
Vitamin B-12: 266 pg/mL (ref 232–1245)
WBC: 10.7 10*3/uL (ref 3.4–10.8)

## 2022-10-29 LAB — LIPID PANEL
Chol/HDL Ratio: 5.4 ratio — ABNORMAL HIGH (ref 0.0–4.4)
Cholesterol, Total: 293 mg/dL — ABNORMAL HIGH (ref 100–199)
HDL: 54 mg/dL (ref >39)
LDL Chol Calc (NIH): 157 mg/dL — ABNORMAL HIGH (ref 0–99)
Triglycerides: 429 mg/dL — ABNORMAL HIGH (ref 0–149)
VLDL Cholesterol Cal: 82 mg/dL — ABNORMAL HIGH (ref 5–40)

## 2022-10-29 LAB — VITAMIN D 25 HYDROXY (VIT D DEFICIENCY, FRACTURES): Vit D, 25-Hydroxy: 23.7 ng/mL — ABNORMAL LOW (ref 30.0–100.0)

## 2022-10-29 LAB — TSH: TSH: 2.04 u[IU]/mL (ref 0.450–4.500)

## 2022-10-29 NOTE — Progress Notes (Signed)
Lymphocytes slightly elevated. Some variation in labs is expected. Vitamin D is slightly low. Recommend taking 1000-2000 international units daily and recheck in 3 months. Cholesterol is elevated. Diet encouraged - increase intake of fresh fruits and vegetables, increase intake of lean proteins. Bake, broil, or grill foods. Avoid fried, greasy, and fatty foods. Avoid fast foods. Increase intake of fiber-rich whole grains. Exercise encouraged - at least 150 minutes per week and advance as tolerated. Can also try red yeast rice and we will recheck in 3 months.

## 2022-11-10 ENCOUNTER — Ambulatory Visit: Payer: Managed Care, Other (non HMO) | Admitting: Professional Counselor

## 2022-11-10 DIAGNOSIS — F331 Major depressive disorder, recurrent, moderate: Secondary | ICD-10-CM

## 2022-11-10 DIAGNOSIS — F419 Anxiety disorder, unspecified: Secondary | ICD-10-CM

## 2022-11-10 NOTE — Patient Instructions (Addendum)
[facility] provided mindfulness resources.   If your symptoms worsen or you have thoughts of suicide/homicide, PLEASE SEEK IMMEDIATE MEDICAL ATTENTION.  You may always call:   National Suicide Hotline: 988 or 651-447-0221 [place] Crisis Line: 862-373-6814 Crisis Recovery in [place]: 650-206-7702     These are available 24 hours a day, 7 days a week.
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
## 2022-11-11 ENCOUNTER — Institutional Professional Consult (permissible substitution): Payer: Managed Care, Other (non HMO) | Admitting: Professional Counselor

## 2022-11-12 ENCOUNTER — Telehealth (INDEPENDENT_AMBULATORY_CARE_PROVIDER_SITE_OTHER): Payer: Self-pay | Admitting: Professional Counselor

## 2022-11-12 DIAGNOSIS — F331 Major depressive disorder, recurrent, moderate: Secondary | ICD-10-CM

## 2022-11-12 DIAGNOSIS — F419 Anxiety disorder, unspecified: Secondary | ICD-10-CM
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
## 2022-11-17 ENCOUNTER — Ambulatory Visit: Payer: Managed Care, Other (non HMO) | Admitting: Professional Counselor

## 2022-11-17 DIAGNOSIS — F419 Anxiety disorder, unspecified: Secondary | ICD-10-CM

## 2022-11-17 DIAGNOSIS — F331 Major depressive disorder, recurrent, moderate: Secondary | ICD-10-CM

## 2022-11-17 NOTE — BH Specialist Note (Signed)
[facility] Follow-up  MRN: [id number] NAME: NASIYA ELVIR Date: 11/17/22  Start time: Start Time: 0315 End time: Stop Time: 0415 Total time: Total Time in Minutes (Visit): 60 Call number: Visit Number: 3- Third Visit  Reason for call today:  The patient is a 37 year old female presenting for a collaborative care follow-up, with the purpose of discussing a recent psychiatric consultation. It was determined that she would benefit from a referral to psychiatry, as she is reaching the maximum dose of Zoloft and continues to experience symptoms of anxiety and depression. A referral has been placed, and the behavioral health counselor also recommended that she see a trauma specialist for long-term therapy. In the meantime, she will remain in collaborative care to bridge services. The patient understands and agrees to this treatment plan.  During today's session, the patient reports that she has not had a good day. Her partner has been very jealous and controlling lately, stemming from insecurity and suspicion that developed after learning she briefly dated someone while he was in prison. He has been looking through her phone and accusing her of being unfaithful, although she insists she has not been. She continues to struggle with his mood swings but notes that her daughter is very happy to have the family together again. The patient reports using mindfulness techniques to stay present for her daughter and mentions having a good day with her, despite her partner's jealousy.  The patient also reports ongoing stress from work and the challenge of maintaining a calm household while managing anxieties related to her partner potentially relapsing. She feels overwhelmed by the various pressures in her life. The behavioral health counselor utilized Cognitive Behavioral Therapy (CBT) to help her challenge and reframe her thoughts and beliefs, encouraging her to respond differently to her current situation. The  counselor provided compassion and education on self-compassion, reassuring her that she is doing a good job prioritizing her daughter. Marriage therapy was recommended as a potential way for the couple to work through their disagreements, and the patient was open to this suggestion. She finds benefit in her sessions with the counselor and feels that they positively impact her day-to-day life. She is also open to longer-term trauma therapy.  PHQ-9 Scores:     11/10/2022    2:17 PM 10/28/2022    3:11 PM 09/16/2022    4:45 PM 08/14/2022   10:17 AM 08/21/2021    3:25 PM  Depression screen PHQ 2/9  Decreased Interest 1 1 1 1 1   Down, Depressed, Hopeless 1 1 1 1 1   PHQ - 2 Score 2 2 2 2 2   Altered sleeping 2 2 1 2 1   Tired, decreased energy 2 1 1 2 1   Change in appetite 1 1 1 2  0  Feeling bad or failure about yourself  0 0 0 0 0  Trouble concentrating 1 1 1 2  0  Moving slowly or fidgety/restless 0 0 1 0 0  Suicidal thoughts 0 0 0  0  PHQ-9 Score 8 7 7 10 4   Difficult doing work/chores Very difficult Very difficult Very difficult Very difficult Somewhat difficult   GAD-7 Scores:     11/10/2022    2:18 PM 10/28/2022    3:12 PM 09/16/2022    4:48 PM 08/14/2022   10:18 AM  GAD 7 : Generalized Anxiety Score  Nervous, Anxious, on Edge 2 2 3 2   Control/stop worrying 1 2 3 1   Worry too much - different things 1 1 2  1  Trouble relaxing 2 2 2 3   Restless 1 1 2 1   Easily annoyed or irritable 2 2 3 2   Afraid - awful might happen 0 0 0 0  Total GAD 7 Score 9 10 15 10   Anxiety Difficulty Very difficult Very difficult Very difficult Very difficult    Stress Current stressors:  Work, current relationship Sleep:  Good Appetite:  Good Coping ability:  Fair Patient taking medications as prescribed:  Yes  Current medications:  Outpatient Encounter Medications as of 11/17/2022  Medication Sig   Budeson-Glycopyrrol-Formoterol (BREZTRI AEROSPHERE) 160-9-4.8 MCG/ACT AERO Inhale 2 puffs into the lungs in the  morning and at bedtime. With Spacer   busPIRone (BUSPAR) 5 MG tablet TAKE 2 TABLETS BY MOUTH 2 TIMES DAILY.   Carbinoxamine Maleate 4 MG/5ML SOLN Take 20 mLs (16 mg total) by mouth at bedtime.   Clindamycin Phosphate (XACIATO) 2 % GEL INSERT 1 APPLICATORFUL EVERY DAY BY VAGINAL ROUTE FOR 1 DAY.   Drospirenone (SLYND) 4 MG TABS TAKE ONE (1) TABLET BY MOUTH DAILY.   famotidine (PEPCID) 20 MG tablet Take 1 tablet (20 mg total) by mouth 2 (two) times daily. One after supper   gabapentin (NEURONTIN) 300 MG capsule TAKE 1 CAPSULE BY MOUTH THREE TIMES A DAY   hydrocortisone 1 % lotion Apply 1 application topically 2 (two) times daily.   hydrOXYzine (ATARAX) 10 MG tablet TAKE 1 TABLET BY MOUTH EVERYDAY AT BEDTIME   levocetirizine (XYZAL) 5 MG tablet Take 1 tablet (5 mg total) by mouth 2 (two) times daily as needed for allergies (Can take an extra dose during flare ups.).   metroNIDAZOLE (METROGEL VAGINAL) 0.75 % vaginal gel Place 1 Applicatorful vaginally at bedtime. After sex or period   ondansetron (ZOFRAN) 4 MG tablet Take 1 tablet (4 mg total) by mouth every 8 (eight) hours as needed for nausea or vomiting.   sertraline (ZOLOFT) 100 MG tablet Take 2 tablets (200 mg total) by mouth daily. (NEEDS TO BE SEEN BEFORE NEXT REFILL)   tinidazole (TINDAMAX) 500 MG tablet Take 4 tablets every day by oral route for 2 days.   tiZANidine (ZANAFLEX) 4 MG tablet TAKE 1-2 TABLETS EVERY EVENING. 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   No facility-administered encounter medications on file as of 11/17/2022.     Self-harm Behaviors Risk Assessment Self-harm risk factors:  Past attempts Patient endorses recent thoughts of harming self:  Denies   Danger to Others Risk Assessment Danger to others risk factors:  None Patient endorses recent thoughts of harming others: Denies     Substance Use Assessment Patient recently consumed alcohol:    Alcohol Use  Disorder Identification Test (AUDIT):     10/05/2012    6:33 PM 07/03/2021   11:10 AM 08/14/2022    9:09 AM 11/10/2022    2:51 PM  Alcohol Use Disorder Test (AUDIT)  1. How often do you have a drink containing alcohol? 1 1 1 1   2. How many drinks containing alcohol do you have on a typical day when you are drinking? 0 0 0 0  3. How often do you have six or more drinks on one occasion? 1 0 0 0  AUDIT-C Score 1 1 1 1     Goals, Interventions and Follow-up Plan Goals: Increase healthy adjustment to current life circumstances Interventions: Mindfulness or Relaxation Training and CBT Cognitive Behavioral Therapy Follow-up Plan: 1 week follow up  Reuel Boom

## 2022-11-20 ENCOUNTER — Other Ambulatory Visit: Payer: Self-pay | Admitting: Family Medicine

## 2022-11-20 DIAGNOSIS — F419 Anxiety disorder, unspecified: Secondary | ICD-10-CM

## 2022-11-25 ENCOUNTER — Ambulatory Visit: Payer: Managed Care, Other (non HMO) | Admitting: Professional Counselor

## 2022-11-25 DIAGNOSIS — F419 Anxiety disorder, unspecified: Secondary | ICD-10-CM

## 2022-11-25 DIAGNOSIS — F331 Major depressive disorder, recurrent, moderate: Secondary | ICD-10-CM

## 2022-11-25 NOTE — BH Specialist Note (Signed)
[facility] Virtual BH Telephone Follow-up  MRN: [id number] NAME: Pamela Gallagher Date: 11/25/22  Start time: Start Time: 0430 End time: Stop Time: 0500 Total time: Total Time in Minutes (Visit): 30 Call number: Visit Number: 4- Fourth Visit  Reason for call today:  The patient, a 37 year old female, presented for a collaborative care follow-up reporting slightly improved symptoms, including less irritability and decreased anxiety. She noted that her overall mood has improved, although she is experiencing some sleep issues. These issues are primarily due to her early work hours, which are influenced by her need to take her husband to his job, requiring her to start her own workday earlier. The patient continues to work through relationship issues, which contribute to her stress. During the session, the behavioral counselor reinforced Cognitive Behavioral Therapy (CBT) techniques and helped the patient process two recent difficult interactions. This involved exploring her perceptions, identifying underlying belief systems, and discussing how to respond differently in the future. The patient expressed appreciation for this approach. Additionally, she mentioned work-related stress but is now more open to staying in her current job due to its stability, despite considering another offer with higher bonuses but lower base pay, which concerns her. On a positive note, she reported having good interactions with her daughter and denied any suicidal ideation, plan, or intent. The plan is to follow up in one week to continue monitoring her symptoms, address ongoing stressors, and reinforce therapeutic techniques.  PHQ-9 Scores:     11/10/2022    2:17 PM 10/28/2022    3:11 PM 09/16/2022    4:45 PM 08/14/2022   10:17 AM 08/21/2021    3:25 PM  Depression screen PHQ 2/9  Decreased Interest 1 1 1 1 1   Down, Depressed, Hopeless 1 1 1 1 1   PHQ - 2 Score 2 2 2 2 2   Altered sleeping 2 2 1 2 1   Tired, decreased energy 2  1 1 2 1   Change in appetite 1 1 1 2  0  Feeling bad or failure about yourself  0 0 0 0 0  Trouble concentrating 1 1 1 2  0  Moving slowly or fidgety/restless 0 0 1 0 0  Suicidal thoughts 0 0 0  0  PHQ-9 Score 8 7 7 10 4   Difficult doing work/chores Very difficult Very difficult Very difficult Very difficult Somewhat difficult   GAD-7 Scores:     11/10/2022    2:18 PM 10/28/2022    3:12 PM 09/16/2022    4:48 PM 08/14/2022   10:18 AM  GAD 7 : Generalized Anxiety Score  Nervous, Anxious, on Edge 2 2 3 2   Control/stop worrying 1 2 3 1   Worry too much - different things 1 1 2 1   Trouble relaxing 2 2 2 3   Restless 1 1 2 1   Easily annoyed or irritable 2 2 3 2   Afraid - awful might happen 0 0 0 0  Total GAD 7 Score 9 10 15 10   Anxiety Difficulty Very difficult Very difficult Very difficult Very difficult    Stress Current stressors:   Sleep:   Appetite:   Coping ability:   Patient taking medications as prescribed:    Current medications:  Outpatient Encounter Medications as of 11/25/2022  Medication Sig   Budeson-Glycopyrrol-Formoterol (BREZTRI AEROSPHERE) 160-9-4.8 MCG/ACT AERO Inhale 2 puffs into the lungs in the morning and at bedtime. With Spacer   busPIRone (BUSPAR) 5 MG tablet TAKE 2 TABLETS BY MOUTH 2 TIMES DAILY.   Carbinoxamine Maleate  4 MG/5ML SOLN Take 20 mLs (16 mg total) by mouth at bedtime.   Clindamycin Phosphate (XACIATO) 2 % GEL INSERT 1 APPLICATORFUL EVERY DAY BY VAGINAL ROUTE FOR 1 DAY.   Drospirenone (SLYND) 4 MG TABS TAKE ONE (1) TABLET BY MOUTH DAILY.   famotidine (PEPCID) 20 MG tablet Take 1 tablet (20 mg total) by mouth 2 (two) times daily. One after supper   gabapentin (NEURONTIN) 300 MG capsule TAKE 1 CAPSULE BY MOUTH THREE TIMES A DAY   hydrocortisone 1 % lotion Apply 1 application topically 2 (two) times daily.   hydrOXYzine (ATARAX) 10 MG tablet TAKE 1 TABLET BY MOUTH EVERYDAY AT BEDTIME   levocetirizine (XYZAL) 5 MG tablet Take 1 tablet (5 mg total) by mouth  2 (two) times daily as needed for allergies (Can take an extra dose during flare ups.).   metroNIDAZOLE (METROGEL VAGINAL) 0.75 % vaginal gel Place 1 Applicatorful vaginally at bedtime. After sex or period   ondansetron (ZOFRAN) 4 MG tablet Take 1 tablet (4 mg total) by mouth every 8 (eight) hours as needed for nausea or vomiting.   sertraline (ZOLOFT) 100 MG tablet Take 2 tablets (200 mg total) by mouth daily. (NEEDS TO BE SEEN BEFORE NEXT REFILL)   tinidazole (TINDAMAX) 500 MG tablet Take 4 tablets every day by oral route for 2 days.   tiZANidine (ZANAFLEX) 4 MG tablet TAKE 1-2 TABLETS EVERY EVENING. 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   No facility-administered encounter medications on file as of 11/25/2022.     Self-harm Behaviors Risk Assessment Self-harm risk factors:  Past attempts Patient endorses recent thoughts of harming self:  Denies    Danger to Others Risk Assessment Danger to others risk factors:  None Patient endorses recent thoughts of harming others:  Denies    Substance Use Assessment Patient recently consumed alcohol:    Alcohol Use Disorder Identification Test (AUDIT):     10/05/2012    6:33 PM 07/03/2021   11:10 AM 08/14/2022    9:09 AM 11/10/2022    2:51 PM  Alcohol Use Disorder Test (AUDIT)  1. How often do you have a drink containing alcohol? 1 1 1 1   2. How many drinks containing alcohol do you have on a typical day when you are drinking? 0 0 0 0  3. How often do you have six or more drinks on one occasion? 1 0 0 0  AUDIT-C Score 1 1 1 1       Goals, Interventions and Follow-up Plan Goals: Decrease anxiety, improve communication in her relationship, and learn effective coping strategies to improve lifestyle. Interventions: CBT Cognitive Behavioral Therapy Follow-up Plan: Follow up in 2 weeks   Reuel Boom

## 2022-11-26 ENCOUNTER — Encounter: Payer: Self-pay | Admitting: *Deleted

## 2022-12-02 ENCOUNTER — Ambulatory Visit: Payer: Managed Care, Other (non HMO) | Admitting: Professional Counselor

## 2022-12-02 DIAGNOSIS — F419 Anxiety disorder, unspecified: Secondary | ICD-10-CM | POA: Diagnosis not present

## 2022-12-02 DIAGNOSIS — F331 Major depressive disorder, recurrent, moderate: Secondary | ICD-10-CM | POA: Diagnosis not present

## 2022-12-02 NOTE — BH Specialist Note (Unsigned)
[place] [facility] Follow-up  MRN: [id number] NAME: Pamela Gallagher Date: 12/02/22  Start time: Start Time: 0400 End time: Stop Time: 0430 Total time: Total Time in Minutes (Visit): 30 Call number: Visit Number: 5-Fifth Visit  Referring Provider: Aleen Campi Patient/Family location: Work [facility], [place] Provider location: Office All persons participating in visit: Patient and [facility] Types of Service: Collaborative care   I connected with patient via Telephone or Engineer, civil (consulting)  (Video is Surveyor, mining) and verified that I am speaking with the correct person using two identifiers. Discussed confidentiality: yes    I discussed the limitations of telemedicine and the availability of in person appointments.  Discussed there is a possibility of technology failure and discussed alternative modes of communication if that failure occurs.   I discussed that engaging in this telemedicine visit, they consent to the provision of behavioral healthcare and the services will be billed under their insurance.    Reason for call today:  The patient is a 37 year old female who presented for a collaborative care follow-up. She reports an overall improvement in her mood. Recently, she went to the beach with her husband and daughter and had a pleasant time, with only one minor conflict with her husband that was resolved. The patient feels that she is handling communication with her husband better than in the past, noting that they are communicating more effectively and that she is successfully setting and maintaining boundaries, which was one of her goals. She also reports feeling more emotionally detached from her husband's actions and responding in a healthier way. Her daughter enjoyed the family time, which brings the patient happiness.  However, the patient continues to struggle with pain, reporting a level of 7 out of 10 in her hips and back, which is contributing to her sleep  problems. She finds it difficult to fall asleep, tosses and turns throughout the night, and has to wake up early for work. She and her husband have discussed getting a second vehicle, which could alleviate the need for her to be dropped off at work early, potentially allowing her to sleep longer and improve her focus at work. Despite these challenges, she reports that work is going well and she is more open to staying in her current job due to the consistency it provides.  The patient's GAD-7 and PHQ-9 scores continue to steadily decrease, reflecting her progress. She has a psychiatric evaluation scheduled to assess whether any medication adjustments are needed and to continue monitoring her symptoms. Overall, she is making progress and benefiting from collaborative care.  PHQ-9 Scores:     12/02/2022    4:08 PM 11/10/2022    2:17 PM 10/28/2022    3:11 PM 09/16/2022    4:45 PM 08/14/2022   10:17 AM  Depression screen PHQ 2/9  Decreased Interest 1 1 1 1 1   Down, Depressed, Hopeless 0 1 1 1 1   PHQ - 2 Score 1 2 2 2 2   Altered sleeping 3 2 2 1 2   Tired, decreased energy 1 2 1 1 2   Change in appetite 0 1 1 1 2   Feeling bad or failure about yourself  0 0 0 0 0  Trouble concentrating 0 1 1 1 2   Moving slowly or fidgety/restless 0 0 0 1 0  Suicidal thoughts 0 0 0 0   PHQ-9 Score 5 8 7 7 10   Difficult doing work/chores Somewhat difficult Very difficult Very difficult Very difficult Very difficult   GAD-7 Scores:  12/02/2022    4:23 PM 11/10/2022    2:18 PM 10/28/2022    3:12 PM 09/16/2022    4:48 PM  GAD 7 : Generalized Anxiety Score  Nervous, Anxious, on Edge 2 2 2 3   Control/stop worrying 1 1 2 3   Worry too much - different things 1 1 1 2   Trouble relaxing 1 2 2 2   Restless 1 1 1 2   Easily annoyed or irritable 1 2 2 3   Afraid - awful might happen 0 0 0 0  Total GAD 7 Score 7 9 10 15   Anxiety Difficulty Somewhat difficult Very difficult Very difficult Very difficult    Stress Current  stressors:  Work, sharing car with husband,  Sleep:  Disrupted Appetite:  Good Coping ability:  Good Patient taking medications as prescribed:  Yes  Current medications:  Outpatient Encounter Medications as of 12/02/2022  Medication Sig   Budeson-Glycopyrrol-Formoterol (BREZTRI AEROSPHERE) 160-9-4.8 MCG/ACT AERO Inhale 2 puffs into the lungs in the morning and at bedtime. With Spacer   busPIRone (BUSPAR) 5 MG tablet TAKE 2 TABLETS BY MOUTH 2 TIMES DAILY.   Carbinoxamine Maleate 4 MG/5ML SOLN Take 20 mLs (16 mg total) by mouth at bedtime.   Clindamycin Phosphate (XACIATO) 2 % GEL INSERT 1 APPLICATORFUL EVERY DAY BY VAGINAL ROUTE FOR 1 DAY.   Drospirenone (SLYND) 4 MG TABS TAKE ONE (1) TABLET BY MOUTH DAILY.   famotidine (PEPCID) 20 MG tablet Take 1 tablet (20 mg total) by mouth 2 (two) times daily. One after supper   gabapentin (NEURONTIN) 300 MG capsule TAKE 1 CAPSULE BY MOUTH THREE TIMES A DAY   hydrocortisone 1 % lotion Apply 1 application topically 2 (two) times daily.   hydrOXYzine (ATARAX) 10 MG tablet TAKE 1 TABLET BY MOUTH EVERYDAY AT BEDTIME   levocetirizine (XYZAL) 5 MG tablet Take 1 tablet (5 mg total) by mouth 2 (two) times daily as needed for allergies (Can take an extra dose during flare ups.).   metroNIDAZOLE (METROGEL VAGINAL) 0.75 % vaginal gel Place 1 Applicatorful vaginally at bedtime. After sex or period   ondansetron (ZOFRAN) 4 MG tablet Take 1 tablet (4 mg total) by mouth every 8 (eight) hours as needed for nausea or vomiting.   sertraline (ZOLOFT) 100 MG tablet Take 2 tablets (200 mg total) by mouth daily. (NEEDS TO BE SEEN BEFORE NEXT REFILL)   tinidazole (TINDAMAX) 500 MG tablet Take 4 tablets every day by oral route for 2 days.   tiZANidine (ZANAFLEX) 4 MG tablet TAKE 1-2 TABLETS EVERY EVENING. 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   No facility-administered encounter medications on file as of 12/02/2022.      Self-harm Behaviors Risk Assessment Self-harm risk factors:  Low Patient endorses recent thoughts of harming self:  Denies   Danger to Others Risk Assessment Danger to others risk factors:  None Patient endorses recent thoughts of harming others:  Denies   Substance Use Assessment Patient recently consumed alcohol:    Alcohol Use Disorder Identification Test (AUDIT):     10/05/2012    6:33 PM 07/03/2021   11:10 AM 08/14/2022    9:09 AM 11/10/2022    2:51 PM  Alcohol Use Disorder Test (AUDIT)  1. How often do you have a drink containing alcohol? 1 1 1 1   2. How many drinks containing alcohol do you have on a typical day when you are drinking? 0 0 0  0  3. How often do you have six or more drinks on one occasion? 1 0 0 0  AUDIT-C Score 1 1 1 1     Goals, Interventions and Follow-up Plan Goals:  Improve communication, get new car, decrease anxiety Interventions: Behavioral Activation and CBT Cognitive Behavioral Therapy Follow-up Plan:  Follow up in 2 weeks   Reuel Boom

## 2022-12-24 ENCOUNTER — Other Ambulatory Visit: Payer: Self-pay | Admitting: Family Medicine

## 2022-12-24 DIAGNOSIS — M797 Fibromyalgia: Secondary | ICD-10-CM

## 2023-01-05 ENCOUNTER — Ambulatory Visit: Payer: Managed Care, Other (non HMO) | Admitting: Professional Counselor

## 2023-01-05 DIAGNOSIS — F419 Anxiety disorder, unspecified: Secondary | ICD-10-CM

## 2023-01-05 DIAGNOSIS — F331 Major depressive disorder, recurrent, moderate: Secondary | ICD-10-CM

## 2023-01-05 NOTE — BH Specialist Note (Signed)
[place] [facility] BH Telephone Follow-up  MRN: [id number] NAME: JADAN ANTIS Date: 01/05/23  Start time: Start Time: 0933 End time: Stop Time: 1014 Total time: Total Time in Minutes (Visit): 41 Call number: Visit Number: 6-Sixth Visit  Reason for call today:  The patient is a 37 year old female who returned for a collaborative care follow-up one month after her last visit. She presents with slightly elevated PHQ-9 and GAD-7 scores but reports that overall, it has been a good month. Her symptoms are being managed well with her current medication regimen. She has been practicing CBT techniques and mindfulness exercises that were discussed in prior sessions, which she finds helpful. The increase in her scores appears to be related primarily to ongoing communication difficulties with her husband. They have struggled to see eye-to-eye on certain issues, but they are actively working on this. A marriage and family therapist referral was provided, and the patient expressed appreciation for the resource. In addition to this, she reports attending church more frequently, which has had a positive impact on her mood. She is focusing on her daughter's well-being, which she finds beneficial, and she continues to receive support from her family. She is still encouraging her husband to seek counseling so they can continue to improve their relationship. Work, sleep, and appetite are all reported as good. The patient identifies the conflict with her husband as her main ongoing challenge. She also received suggestions from her behavioral health counselor and appreciated the guidance, noting that counseling has been beneficial. The patient will follow up in two weeks.  PHQ-9 Scores:     01/05/2023   10:28 AM 12/02/2022    4:08 PM 11/10/2022    2:17 PM 10/28/2022    3:11 PM 09/16/2022    4:45 PM  Depression screen PHQ 2/9  Decreased Interest 2 1 1 1 1   Down, Depressed, Hopeless 2 0 1 1 1   PHQ - 2 Score 4 1 2 2 2    Altered sleeping 1 3 2 2 1   Tired, decreased energy 2 1 2 1 1   Change in appetite 1 0 1 1 1   Feeling bad or failure about yourself  1 0 0 0 0  Trouble concentrating 2 0 1 1 1   Moving slowly or fidgety/restless 1 0 0 0 1  Suicidal thoughts 1 0 0 0 0  PHQ-9 Score 13 5 8 7 7   Difficult doing work/chores Somewhat difficult Somewhat difficult Very difficult Very difficult Very difficult   GAD-7 Scores:     01/05/2023   10:29 AM 12/02/2022    4:23 PM 11/10/2022    2:18 PM 10/28/2022    3:12 PM  GAD 7 : Generalized Anxiety Score  Nervous, Anxious, on Edge 2 2 2 2   Control/stop worrying 2 1 1 2   Worry too much - different things 2 1 1 1   Trouble relaxing 2 1 2 2   Restless 2 1 1 1   Easily annoyed or irritable 1 1 2 2   Afraid - awful might happen 1 0 0 0  Total GAD 7 Score 12 7 9 10   Anxiety Difficulty Somewhat difficult Somewhat difficult Very difficult Very difficult    Stress Current stressors:  Work/relationship Sleep:  Good Appetite:  Good Coping ability:  Good Patient taking medications as prescribed:  Yes  Current medications:  Outpatient Encounter Medications as of 01/05/2023  Medication Sig   Budeson-Glycopyrrol-Formoterol (BREZTRI AEROSPHERE) 160-9-4.8 MCG/ACT AERO Inhale 2 puffs into the lungs in the morning and at bedtime. With  Spacer   busPIRone (BUSPAR) 5 MG tablet TAKE 2 TABLETS BY MOUTH 2 TIMES DAILY.   Carbinoxamine Maleate 4 MG/5ML SOLN Take 20 mLs (16 mg total) by mouth at bedtime.   Clindamycin Phosphate (XACIATO) 2 % GEL INSERT 1 APPLICATORFUL EVERY DAY BY VAGINAL ROUTE FOR 1 DAY.   Drospirenone (SLYND) 4 MG TABS TAKE ONE (1) TABLET BY MOUTH DAILY.   famotidine (PEPCID) 20 MG tablet Take 1 tablet (20 mg total) by mouth 2 (two) times daily. One after supper   gabapentin (NEURONTIN) 300 MG capsule TAKE 1 CAPSULE BY MOUTH THREE TIMES A DAY   hydrocortisone 1 % lotion Apply 1 application topically 2 (two) times daily.   hydrOXYzine (ATARAX) 10 MG tablet TAKE 1 TABLET  BY MOUTH EVERYDAY AT BEDTIME   levocetirizine (XYZAL) 5 MG tablet Take 1 tablet (5 mg total) by mouth 2 (two) times daily as needed for allergies (Can take an extra dose during flare ups.).   metroNIDAZOLE (METROGEL VAGINAL) 0.75 % vaginal gel Place 1 Applicatorful vaginally at bedtime. After sex or period   ondansetron (ZOFRAN) 4 MG tablet Take 1 tablet (4 mg total) by mouth every 8 (eight) hours as needed for nausea or vomiting.   sertraline (ZOLOFT) 100 MG tablet Take 2 tablets (200 mg total) by mouth daily. (NEEDS TO BE SEEN BEFORE NEXT REFILL)   tinidazole (TINDAMAX) 500 MG tablet Take 4 tablets every day by oral route for 2 days.   tiZANidine (ZANAFLEX) 4 MG tablet TAKE 1-2 TABLETS EVERY EVENING. DX M79.7   traMADol (ULTRAM) 50 MG tablet Take 50 mg by mouth 3 (three) times daily as needed.   No facility-administered encounter medications on file as of 01/05/2023.     Self-harm Behaviors Risk Assessment Self-harm risk factors:  Past thoughts Patient endorses recent thoughts of harming self:  Denies   Danger to Others Risk Assessment Danger to others risk factors:  None Patient endorses recent thoughts of harming others:  Denies    Substance Use Assessment Patient recently consumed alcohol:    Alcohol Use Disorder Identification Test (AUDIT):     10/05/2012    6:33 PM 07/03/2021   11:10 AM 08/14/2022    9:09 AM 11/10/2022    2:51 PM  Alcohol Use Disorder Test (AUDIT)  1. How often do you have a drink containing alcohol? 1 1 1 1   2. How many drinks containing alcohol do you have on a typical day when you are drinking? 0 0 0 0  3. How often do you have six or more drinks on one occasion? 1 0 0 0  AUDIT-C Score 1 1 1 1     Goals, Interventions and Follow-up Plan Goals:  Improve communication with husband Interventions: CBT Cognitive Behavioral Therapy Follow-up Plan:  Bi-weekly counseling   Reuel Boom

## 2023-01-10 ENCOUNTER — Other Ambulatory Visit: Payer: Self-pay | Admitting: Family Medicine

## 2023-01-10 DIAGNOSIS — F4323 Adjustment disorder with mixed anxiety and depressed mood: Secondary | ICD-10-CM

## 2023-01-18 ENCOUNTER — Other Ambulatory Visit: Payer: Self-pay | Admitting: Family Medicine

## 2023-01-18 DIAGNOSIS — M797 Fibromyalgia: Secondary | ICD-10-CM

## 2023-01-19 ENCOUNTER — Ambulatory Visit (INDEPENDENT_AMBULATORY_CARE_PROVIDER_SITE_OTHER): Payer: Managed Care, Other (non HMO) | Admitting: Professional Counselor

## 2023-01-19 DIAGNOSIS — F419 Anxiety disorder, unspecified: Secondary | ICD-10-CM

## 2023-01-19 DIAGNOSIS — F331 Major depressive disorder, recurrent, moderate: Secondary | ICD-10-CM

## 2023-01-19 NOTE — BH Specialist Note (Signed)
[place] Virtual BH Telephone Follow-up  MRN: [id number] NAME: Pamela Gallagher Date: 01/19/23  Start time: Start Time: 1000 End time: Stop Time: 1030 Total time: Total Time in Minutes (Visit): 30 Call number: Visit Number: Additional Visit  Reason for call today:  The patient is a 37 year old female who presented for a collaborative care follow-up session via telehealth. The behavioral counselor explained the limitations of telehealth, and the patient understood and agreed to proceed.   She reports experiencing heightened stress due to ongoing marital conflict, car trouble, and general anxiety. The patient shared that disagreements with her husband, particularly regarding parenting styles and lifestyle differences, have been a significant source of tension. She has been trying to set boundaries, emphasizing that she does not want certain lifestyle choices to affect their daughter. Recently, she asked her husband to leave the home, which he did, but she continues to struggle with managing this situation. The patient expressed a desire to keep the family together but feels conflicted as her personal values are important, and she needs more cooperation from her husband.  In addition to the marital issues, the patient reported that her car broke down, making it difficult to get to work, which has further contributed to her stress. During the session, the behavioral counselor and the patient discussed problem-solving strategies and setting short-term, manageable goals. The patient found this approach helpful. She continues to practice cognitive behavioral therapy (CBT) and mindfulness techniques learned in previous sessions, which have been beneficial, though her current circumstances remain highly stressful. Despite the challenges, the patient is hopeful that things will improve. Due to the increased stress, follow-up appointments have been scheduled more frequently, with the next session planned for one  week.  PHQ-9 Scores:     01/05/2023   10:28 AM 12/02/2022    4:08 PM 11/10/2022    2:17 PM 10/28/2022    3:11 PM 09/16/2022    4:45 PM  Depression screen PHQ 2/9  Decreased Interest 2 1 1 1 1   Down, Depressed, Hopeless 2 0 1 1 1   PHQ - 2 Score 4 1 2 2 2   Altered sleeping 1 3 2 2 1   Tired, decreased energy 2 1 2 1 1   Change in appetite 1 0 1 1 1   Feeling bad or failure about yourself  1 0 0 0 0  Trouble concentrating 2 0 1 1 1   Moving slowly or fidgety/restless 1 0 0 0 1  Suicidal thoughts 1 0 0 0 0  PHQ-9 Score 13 5 8 7 7   Difficult doing work/chores Somewhat difficult Somewhat difficult Very difficult Very difficult Very difficult   GAD-7 Scores:     01/05/2023   10:29 AM 12/02/2022    4:23 PM 11/10/2022    2:18 PM 10/28/2022    3:12 PM  GAD 7 : Generalized Anxiety Score  Nervous, Anxious, on Edge 2 2 2 2   Control/stop worrying 2 1 1 2   Worry too much - different things 2 1 1 1   Trouble relaxing 2 1 2 2   Restless 2 1 1 1   Easily annoyed or irritable 1 1 2 2   Afraid - awful might happen 1 0 0 0  Total GAD 7 Score 12 7 9 10   Anxiety Difficulty Somewhat difficult Somewhat difficult Very difficult Very difficult    Stress Current stressors:  Car trouble, marital conflict Sleep:  Fair Appetite:  Fair Coping ability:  Fair Patient taking medications as prescribed:  Yes  Current medications:  Outpatient  Encounter Medications as of 01/19/2023  Medication Sig   Budeson-Glycopyrrol-Formoterol (BREZTRI AEROSPHERE) 160-9-4.8 MCG/ACT AERO Inhale 2 puffs into the lungs in the morning and at bedtime. With Spacer   busPIRone (BUSPAR) 5 MG tablet TAKE 2 TABLETS BY MOUTH TWICE A DAY   Carbinoxamine Maleate 4 MG/5ML SOLN Take 20 mLs (16 mg total) by mouth at bedtime.   Clindamycin Phosphate (XACIATO) 2 % GEL INSERT 1 APPLICATORFUL EVERY DAY BY VAGINAL ROUTE FOR 1 DAY.   Drospirenone (SLYND) 4 MG TABS TAKE ONE (1) TABLET BY MOUTH DAILY.   famotidine (PEPCID) 20 MG tablet Take 1 tablet (20  mg total) by mouth 2 (two) times daily. One after supper   gabapentin (NEURONTIN) 300 MG capsule TAKE 1 CAPSULE BY MOUTH THREE TIMES A DAY   hydrocortisone 1 % lotion Apply 1 application topically 2 (two) times daily.   hydrOXYzine (ATARAX) 10 MG tablet TAKE 1 TABLET BY MOUTH EVERYDAY AT BEDTIME   levocetirizine (XYZAL) 5 MG tablet Take 1 tablet (5 mg total) by mouth 2 (two) times daily as needed for allergies (Can take an extra dose during flare ups.).   metroNIDAZOLE (METROGEL VAGINAL) 0.75 % vaginal gel Place 1 Applicatorful vaginally at bedtime. After sex or period   ondansetron (ZOFRAN) 4 MG tablet Take 1 tablet (4 mg total) by mouth every 8 (eight) hours as needed for nausea or vomiting.   sertraline (ZOLOFT) 100 MG tablet Take 2 tablets (200 mg total) by mouth daily. (NEEDS TO BE SEEN BEFORE NEXT REFILL)   tinidazole (TINDAMAX) 500 MG tablet Take 4 tablets every day by oral route for 2 days.   tiZANidine (ZANAFLEX) 4 MG tablet TAKE 1-2 TABLETS EVERY EVENING. DX M79.7   traMADol (ULTRAM) 50 MG tablet Take 50 mg by mouth 3 (three) times daily as needed.   No facility-administered encounter medications on file as of 01/19/2023.     Self-harm Behaviors Risk Assessment Self-harm risk factors:  Past thoughts Patient endorses recent thoughts of harming self:  Denies   Danger to Others Risk Assessment Danger to others risk factors:  None Patient endorses recent thoughts of harming others:  Denies   Substance Use Assessment Patient recently consumed alcohol:  No  Alcohol Use Disorder Identification Test (AUDIT):     10/05/2012    6:33 PM 07/03/2021   11:10 AM 08/14/2022    9:09 AM 11/10/2022    2:51 PM  Alcohol Use Disorder Test (AUDIT)  1. How often do you have a drink containing alcohol? 1 1 1 1   2. How many drinks containing alcohol do you have on a typical day when you are drinking? 0 0 0 0  3. How often do you have six or more drinks on one occasion? 1 0 0 0  AUDIT-C Score 1 1 1 1      Goals, Interventions and Follow-up Plan Goals:  Navigate marital conflict Interventions: Mindfulness or Relaxation Training and CBT Cognitive Behavioral Therapy Follow-up Plan:  Increase visits to weekly    Reuel Boom

## 2023-01-26 ENCOUNTER — Encounter: Payer: Managed Care, Other (non HMO) | Admitting: Professional Counselor

## 2023-02-16 ENCOUNTER — Ambulatory Visit (INDEPENDENT_AMBULATORY_CARE_PROVIDER_SITE_OTHER): Payer: Managed Care, Other (non HMO) | Admitting: Nurse Practitioner

## 2023-02-16 ENCOUNTER — Encounter: Payer: Self-pay | Admitting: Nurse Practitioner

## 2023-02-16 VITALS — BP 100/60 | HR 85 | Temp 97.6°F | Ht 63.0 in | Wt 125.8 lb

## 2023-02-16 DIAGNOSIS — J029 Acute pharyngitis, unspecified: Secondary | ICD-10-CM | POA: Diagnosis not present

## 2023-02-16 DIAGNOSIS — J0110 Acute frontal sinusitis, unspecified: Secondary | ICD-10-CM

## 2023-02-16 DIAGNOSIS — J3489 Other specified disorders of nose and nasal sinuses: Secondary | ICD-10-CM

## 2023-02-16 DIAGNOSIS — R0981 Nasal congestion: Secondary | ICD-10-CM | POA: Diagnosis not present

## 2023-02-16 DIAGNOSIS — R051 Acute cough: Secondary | ICD-10-CM | POA: Diagnosis not present

## 2023-02-16 LAB — RAPID STREP SCREEN (MED CTR MEBANE ONLY): Strep Gp A Ag, IA W/Reflex: NEGATIVE

## 2023-02-16 LAB — CULTURE, GROUP A STREP

## 2023-02-16 LAB — VERITOR FLU A/B WAIVED
Influenza A: NEGATIVE
Influenza B: NEGATIVE

## 2023-02-16 MED ORDER — FLUTICASONE PROPIONATE 50 MCG/ACT NA SUSP: 2.0000 | Freq: Every day | NASAL | 6 refills | Status: DC

## 2023-02-16 MED ORDER — AZITHROMYCIN 250 MG PO TABS: ORAL_TABLET | ORAL | 0 refills | Status: DC

## 2023-02-16 MED ORDER — BENZONATATE 100 MG PO CAPS: 100.0000 mg | ORAL_CAPSULE | Freq: Three times a day (TID) | ORAL | 0 refills | Status: DC | PRN

## 2023-02-16 NOTE — Progress Notes (Signed)
Acute Office Visit  Subjective:     Patient ID: Pamela Gallagher, female    DOB: [birth year], 37 y.o.   MRN: [id number]  Chief Complaint  Patient presents with   Sore Throat    Symptoms started Thursday night   Cough   Wheezing   Nasal Congestion    Sore Throat  Associated symptoms include congestion and coughing. Pertinent negatives include no headaches or vomiting.  Cough Associated symptoms include wheezing. Pertinent negatives include no chest pain, headaches or rash. There is no history of environmental allergies.  Wheezing  Associated symptoms include coughing. Pertinent negatives include no chest pain, headaches, rash or vomiting.   1. Cough Patient reports a 6 days history of productive cough.  It is worse with  and improved by at night .  reports chest congestion, nasal congestion, rhinorrhea, hemoptysis, shortness of breath, wheeze, chest pain, post tussive emesis, nausea, diarrhea, fever, or chills.  Patient denies sick contacts, Denies  recent travel.  No pulmonary disease including asthma or COPD. No tobacco use.  Patient is not UTD on immunizations.  POC Flue A& B negative, Strep negative  Active Ambulatory Problems    Diagnosis Date Noted   Depression 04/24/2011   Cigarette smoker 05/04/2018   Marijuana use 05/10/2018   Endometriosis 05/12/2018   Cough variant asthma 10/25/2018   Recurrent moderate major depressive disorder with anxiety (HCC) 04/22/2017   Panic attacks 09/23/2017   Chronic urticaria 03/11/2021   Upper respiratory tract infection 06/04/2021   Screening examination for STD (sexually transmitted disease) 03/31/2022   Irregular bleeding 03/31/2022   Urinary urgency 03/31/2022   Acute cystitis with hematuria 03/31/2022   Resolved Ambulatory Problems    Diagnosis Date Noted   Dysuria 01/14/2012   Costochondral chest pain 02/10/2012   Anovulatory (dysfunctional uterine) bleeding 08/23/2012   Benzodiazepine dependence (HCC) 10/07/2012    Supervision of normal first pregnancy 04/30/2018   PROM (premature rupture of membranes) 10/24/2018   Vacuum-assisted vaginal delivery 10/25/2018   Past Medical History:  Diagnosis Date   Abnormal Pap smear 2011   Anxiety and depression    Asthma    Migraines    PONV (postoperative nausea and vomiting)    PTSD (post-traumatic stress disorder)     Review of Systems  HENT:  Positive for congestion and sinus pain.   Eyes:  Negative for pain.  Respiratory:  Positive for cough and wheezing.   Cardiovascular:  Negative for chest pain and leg swelling.  Gastrointestinal:  Negative for blood in stool, constipation, nausea and vomiting.  Skin:  Negative for itching and rash.  Neurological:  Negative for dizziness and headaches.  Endo/Heme/Allergies:  Negative for environmental allergies and polydipsia. Does not bruise/bleed easily.  Psychiatric/Behavioral:  Negative for substance abuse. The patient does not have insomnia.    Negative unless indicated in HPI    Objective:    BP 100/60   Pulse 85   Temp 97.6 F (36.4 C) (Temporal)   Ht 5\' 3"  (1.6 m)   Wt 125 lb 12.8 oz (57.1 kg)   SpO2 97%   BMI 22.28 kg/m  BP Readings from Last 3 Encounters:  02/16/23 100/60  10/28/22 94/68  10/15/22 111/76   Wt Readings from Last 3 Encounters:  02/16/23 125 lb 12.8 oz (57.1 kg)  10/28/22 125 lb (56.7 kg)  10/15/22 122 lb (55.3 kg)      Physical Exam Vitals and nursing note reviewed.  Constitutional:      Appearance: She is well-developed.  HENT:     Head: Normocephalic and atraumatic.     Nose: Congestion and rhinorrhea present.     Right Sinus: Frontal sinus tenderness present. No maxillary sinus tenderness.     Left Sinus: Frontal sinus tenderness present. No maxillary sinus tenderness.  Cardiovascular:     Rate and Rhythm: Normal rate and regular rhythm.  Pulmonary:     Effort: Pulmonary effort is normal.     Breath sounds: Normal breath sounds.  Musculoskeletal:         General: Normal range of motion.     Cervical back: Normal range of motion and neck supple.     Right lower leg: No edema.     Left lower leg: No edema.  Skin:    General: Skin is warm and dry.     Coloration: Skin is not jaundiced.     Findings: No rash.  Neurological:     Mental Status: She is alert and oriented to person, place, and time. Mental status is at baseline.     No results found for any visits on 02/16/23.      Assessment & Plan:  Acute cough -     Novel Coronavirus, NAA ([facility]) -     Veritor Flu A/B Waived -     Benzonatate; Take 1 capsule (100 mg total) by mouth 3 (three) times daily as needed for cough.  Dispense: 20 capsule; Refill: 0  Sore throat -     Novel Coronavirus, NAA ([facility]) -     Veritor Flu A/B Waived -     Rapid Strep Screen ([facility] ONLY) -     Benzonatate; Take 1 capsule (100 mg total) by mouth 3 (three) times daily as needed for cough.  Dispense: 20 capsule; Refill: 0  Nasal congestion with rhinorrhea -     Fluticasone Propionate; Place 2 sprays into both nostrils daily.  Dispense: 16 g; Refill: 6  Acute non-recurrent frontal sinusitis -     Azithromycin; 2 tab today and  1-tab daily until done  Dispense: 6 each; Refill: 0   [facility] was seen today fro sinusitis,   Zpack and Flonase,  Cough: Tessalon pearl TID PRN Increase hydration, rest Return to work note provided  Encourage healthy lifestyle choices, including diet (rich in fruits, vegetables, and lean proteins, and low in salt and simple carbohydrates) and exercise (at least 30 minutes of moderate physical activity daily).     The above assessment and management plan was discussed with the patient. The patient verbalized understanding of and has agreed to the management plan. Patient is aware to call the clinic if they develop any new symptoms or if symptoms persist or worsen. Patient is aware when to return to the clinic for a follow-up visit. Patient educated on when it is  appropriate to go to the emergency department.  Return if symptoms worsen or fail to improve.  Arrie Aran Santa Lighter, DNP [facility] [address] [place], [place] [address] [phone]

## 2023-02-17 ENCOUNTER — Ambulatory Visit (HOSPITAL_COMMUNITY): Payer: 59 | Admitting: Psychiatry

## 2023-02-17 ENCOUNTER — Encounter (HOSPITAL_COMMUNITY): Payer: Self-pay | Admitting: Psychiatry

## 2023-02-17 DIAGNOSIS — F4310 Post-traumatic stress disorder, unspecified: Secondary | ICD-10-CM | POA: Diagnosis not present

## 2023-02-17 DIAGNOSIS — F15982 Other stimulant use, unspecified with stimulant-induced sleep disorder: Secondary | ICD-10-CM | POA: Diagnosis not present

## 2023-02-17 DIAGNOSIS — Z789 Other specified health status: Secondary | ICD-10-CM | POA: Insufficient documentation

## 2023-02-17 DIAGNOSIS — F332 Major depressive disorder, recurrent severe without psychotic features: Secondary | ICD-10-CM

## 2023-02-17 DIAGNOSIS — Z6981 Encounter for mental health services for victim of other abuse: Secondary | ICD-10-CM | POA: Insufficient documentation

## 2023-02-17 DIAGNOSIS — F17290 Nicotine dependence, other tobacco product, uncomplicated: Secondary | ICD-10-CM | POA: Diagnosis not present

## 2023-02-17 DIAGNOSIS — Z8489 Family history of other specified conditions: Secondary | ICD-10-CM

## 2023-02-17 DIAGNOSIS — F411 Generalized anxiety disorder: Secondary | ICD-10-CM | POA: Diagnosis not present

## 2023-02-17 DIAGNOSIS — Z87891 Personal history of nicotine dependence: Secondary | ICD-10-CM

## 2023-02-17 DIAGNOSIS — F410 Panic disorder [episodic paroxysmal anxiety] without agoraphobia: Secondary | ICD-10-CM

## 2023-02-17 HISTORY — DX: Family history of other specified conditions: Z84.89

## 2023-02-17 LAB — NOVEL CORONAVIRUS, NAA: SARS-CoV-2, NAA: NOT DETECTED

## 2023-02-17 NOTE — Patient Instructions (Addendum)
We did not make any medication changes today.  In the meantime, please continue to cut back on caffeine starting with the caffeine closest to bedtime.  Decrease by half a unit (unit is a bottle, can, glass etc.) every 5 days to a goal of 1 unit of caffeine first thing in the morning.  This should avoid some of the caffeine withdrawal headaches but if they do occur you can do sips to get them to go away just do not go back to the old amount.  This should help with your anxiety and insomnia and should improve your appetite as well.  Please go to this website to get the quit Line number and they can send you free resources for smoking cessation: [email or web address].[email or web address]  Just be sure to take off nicotine patches before bedtime.
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
## 2023-02-18 ENCOUNTER — Telehealth: Payer: Self-pay | Admitting: Family Medicine

## 2023-02-18 ENCOUNTER — Other Ambulatory Visit: Payer: Self-pay | Admitting: Family Medicine

## 2023-02-18 ENCOUNTER — Encounter: Payer: Self-pay | Admitting: Nurse Practitioner

## 2023-02-18 DIAGNOSIS — R252 Cramp and spasm: Secondary | ICD-10-CM

## 2023-02-18 DIAGNOSIS — E559 Vitamin D deficiency, unspecified: Secondary | ICD-10-CM

## 2023-02-18 NOTE — Telephone Encounter (Signed)
-----   Message from Neale Burly sent at 02/18/2023  2:47 PM EDT ----- Patient does not have appt until January. Can we call her and ask her to make a lab appt for Anemia Panel, Magnesium, CMP, and Vitamin D? ----- Message ----- From: Elsie Lincoln, MD Sent: 02/17/2023   3:19 PM EDT To: Arrie Senate, FNP  Jerrel Ivory,  Thank you for the referral.  Would you be able to check an iron panel freshly?  She is having some restless legs and I know she had a history of iron deficiency in the past.  Also looks like she never got on a vitamin D supplement before so if you could provide her recommendations for a recheck that would be appreciated.  With her desire to be on as few medications as possible, we did not change any medications today and she will focus on cutting back on caffeine and trying to quit vaping for now.  Thanks, Sam

## 2023-02-18 NOTE — Telephone Encounter (Signed)
-----   Message from Elsie Lincoln sent at 02/17/2023  3:19 PM EDT ----- Jerrel Ivory,  Thank you for the referral.  Would you be able to check an iron panel freshly?  She is having some restless legs and I know she had a history of iron deficiency in the past.  Also looks like she never got on a vitamin D supplement before so if you could provide her recommendations for a recheck that would be appreciated.  With her desire to be on as few medications as possible, we did not change any medications today and she will focus on cutting back on caffeine and trying to quit vaping for now.  Thanks, Sam

## 2023-02-18 NOTE — Telephone Encounter (Signed)
  Incoming Patient Call  02/18/2023  What symptoms do you have? Wheezing, coughing, sweating when coughing and not sleeping at night from coughing. Patient needs work note extended from 02-15-09-31.  How long have you been sick? Last Thursday  Have you been seen for this problem? 10-28 with Dois Davenport  If your provider decides to give you a prescription, which pharmacy would you like for it to be sent to? [facility] in [place].  Patient informed that this information will be sent to the clinical staff for review and that they should receive a follow up call.

## 2023-02-18 NOTE — Telephone Encounter (Signed)
Letter printed for patient to pick up left detailed message

## 2023-02-18 NOTE — Telephone Encounter (Signed)
Pamela Gallagher,   Are you alright to extend the work note?

## 2023-02-18 NOTE — Telephone Encounter (Signed)
Pt will be excused from work beginning 10/28 until...  Left message for pt to return call to let us know.

## 2023-02-18 NOTE — Telephone Encounter (Signed)
02-15-09-30 and go back on 10-31.

## 2023-02-18 NOTE — Telephone Encounter (Signed)
Called patient she is aware will call and make an appointment later to come in orders in.

## 2023-02-19 ENCOUNTER — Telehealth: Payer: Self-pay | Admitting: Family Medicine

## 2023-03-15 ENCOUNTER — Emergency Department (HOSPITAL_COMMUNITY): Payer: Managed Care, Other (non HMO)

## 2023-03-15 ENCOUNTER — Encounter (HOSPITAL_COMMUNITY): Payer: Self-pay | Admitting: *Deleted

## 2023-03-15 ENCOUNTER — Other Ambulatory Visit: Payer: Self-pay

## 2023-03-15 ENCOUNTER — Emergency Department (HOSPITAL_COMMUNITY)
Admission: EM | Admit: 2023-03-15 | Discharge: 2023-03-15 | Disposition: A | Discharge status: Home / Self Care | Payer: Managed Care, Other (non HMO) | Attending: Student | Admitting: Student

## 2023-03-15 DIAGNOSIS — D72829 Elevated white blood cell count, unspecified: Secondary | ICD-10-CM | POA: Diagnosis not present

## 2023-03-15 DIAGNOSIS — N83202 Unspecified ovarian cyst, left side: Secondary | ICD-10-CM | POA: Insufficient documentation

## 2023-03-15 DIAGNOSIS — Z3490 Encounter for supervision of normal pregnancy, unspecified, unspecified trimester: Secondary | ICD-10-CM

## 2023-03-15 DIAGNOSIS — O26891 Other specified pregnancy related conditions, first trimester: Secondary | ICD-10-CM | POA: Diagnosis present

## 2023-03-15 DIAGNOSIS — Z3A01 Less than 8 weeks gestation of pregnancy: Secondary | ICD-10-CM | POA: Diagnosis not present

## 2023-03-15 DIAGNOSIS — O99111 Other diseases of the blood and blood-forming organs and certain disorders involving the immune mechanism complicating pregnancy, first trimester: Secondary | ICD-10-CM | POA: Insufficient documentation

## 2023-03-15 DIAGNOSIS — O208 Other hemorrhage in early pregnancy: Secondary | ICD-10-CM

## 2023-03-15 DIAGNOSIS — O3481 Maternal care for other abnormalities of pelvic organs, first trimester: Secondary | ICD-10-CM | POA: Diagnosis not present

## 2023-03-15 DIAGNOSIS — N83209 Unspecified ovarian cyst, unspecified side: Secondary | ICD-10-CM

## 2023-03-15 HISTORY — DX: Urinary tract infection, site not specified: N39.0

## 2023-03-15 LAB — URINALYSIS, W/ REFLEX TO CULTURE (INFECTION SUSPECTED)
Bacteria, UA: NONE SEEN
Bilirubin Urine: NEGATIVE
Glucose, UA: NEGATIVE mg/dL
Hgb urine dipstick: NEGATIVE
Ketones, ur: 5 mg/dL — AB
Nitrite: NEGATIVE
Protein, ur: 100 mg/dL — AB
RBC / HPF: >50 RBC/hpf (ref 0–5)
Specific Gravity, Urine: 1.031 — ABNORMAL HIGH (ref 1.005–1.030)
pH: 5 (ref 5.0–8.0)

## 2023-03-15 LAB — COMPREHENSIVE METABOLIC PANEL
ALT: 13 U/L (ref 0–44)
AST: 21 U/L (ref 15–41)
Albumin: 4.6 g/dL (ref 3.5–5.0)
Alkaline Phosphatase: 69 U/L (ref 38–126)
Anion gap: 13 (ref 5–15)
BUN: 10 mg/dL (ref 6–20)
CO2: 19 mmol/L — ABNORMAL LOW (ref 22–32)
Calcium: 9.3 mg/dL (ref 8.9–10.3)
Chloride: 103 mmol/L (ref 98–111)
Creatinine, Ser: 0.85 mg/dL (ref 0.44–1.00)
GFR, Estimated: >60 mL/min (ref >60)
Glucose, Bld: 86 mg/dL (ref 70–99)
Potassium: 3.5 mmol/L (ref 3.5–5.1)
Sodium: 135 mmol/L (ref 135–145)
Total Bilirubin: 0.7 mg/dL (ref <1.2)
Total Protein: 7.9 g/dL (ref 6.5–8.1)

## 2023-03-15 LAB — CBC WITH DIFFERENTIAL/PLATELET
Abs Immature Granulocytes: 0.08 10*3/uL — ABNORMAL HIGH (ref 0.00–0.07)
Basophils Absolute: 0.1 10*3/uL (ref 0.0–0.1)
Basophils Relative: 1 %
Eosinophils Absolute: 0.2 10*3/uL (ref 0.0–0.5)
Eosinophils Relative: 1 %
HCT: 47.2 % — ABNORMAL HIGH (ref 36.0–46.0)
Hemoglobin: 16.1 g/dL — ABNORMAL HIGH (ref 12.0–15.0)
Immature Granulocytes: 1 %
Lymphocytes Relative: 17 %
Lymphs Abs: 2.5 10*3/uL (ref 0.7–4.0)
MCH: 30.8 pg (ref 26.0–34.0)
MCHC: 34.1 g/dL (ref 30.0–36.0)
MCV: 90.2 fL (ref 80.0–100.0)
Monocytes Absolute: 0.9 10*3/uL (ref 0.1–1.0)
Monocytes Relative: 6 %
Neutro Abs: 11 10*3/uL — ABNORMAL HIGH (ref 1.7–7.7)
Neutrophils Relative %: 74 %
Platelets: 289 10*3/uL (ref 150–400)
RBC: 5.23 MIL/uL — ABNORMAL HIGH (ref 3.87–5.11)
RDW: 11.9 % (ref 11.5–15.5)
WBC: 14.7 10*3/uL — ABNORMAL HIGH (ref 4.0–10.5)
nRBC: 0 % (ref 0.0–0.2)

## 2023-03-15 LAB — LACTIC ACID, PLASMA
Lactic Acid, Venous: 2.3 mmol/L (ref 0.5–1.9)
Lactic Acid, Venous: 1.1 mmol/L (ref 0.5–1.9)

## 2023-03-15 LAB — HCG, QUANTITATIVE, PREGNANCY: hCG, Beta Chain, Quant, S: 69936 m[IU]/mL — ABNORMAL HIGH (ref <5)

## 2023-03-15 LAB — ABO/RH: ABO/RH(D): O POS

## 2023-03-15 LAB — PROTIME-INR
INR: 1 (ref 0.8–1.2)
Prothrombin Time: 13.3 s (ref 11.4–15.2)

## 2023-03-15 LAB — PREGNANCY, URINE: Preg Test, Ur: POSITIVE — AB

## 2023-03-15 MED ORDER — HYDROMORPHONE HCL 1 MG/ML IJ SOLN: 1.0000 mg | Freq: Once | INTRAMUSCULAR | Status: DC

## 2023-03-15 MED ORDER — ACETAMINOPHEN 500 MG PO TABS
1000.0000 mg | ORAL_TABLET | Freq: Once | ORAL | Status: AC
  Administered 2023-03-15: 1000 mg via ORAL
  Filled 2023-03-15: qty 2

## 2023-03-15 MED ORDER — ONDANSETRON 4 MG PO TBDP
4.0000 mg | ORAL_TABLET | Freq: Once | ORAL | Status: AC
  Administered 2023-03-15: 4 mg via ORAL
  Filled 2023-03-15: qty 1

## 2023-03-15 MED ORDER — HYDROCODONE-ACETAMINOPHEN 5-325 MG PO TABS: 1.0000 | ORAL_TABLET | Freq: Four times a day (QID) | ORAL | 0 refills | Status: DC | PRN

## 2023-03-15 MED ORDER — HYDROMORPHONE HCL 1 MG/ML IJ SOLN
0.5000 mg | Freq: Once | INTRAMUSCULAR | Status: AC
  Administered 2023-03-15: 0.5 mg via INTRAMUSCULAR
  Filled 2023-03-15: qty 0.5

## 2023-03-15 MED ORDER — LACTATED RINGERS IV BOLUS
1000.0000 mL | Freq: Once | INTRAVENOUS | Status: AC
  Administered 2023-03-15: 1000 mL via INTRAVENOUS

## 2023-03-15 NOTE — ED Notes (Signed)
Lab called pts lactic was 2.3 PA aware.

## 2023-03-15 NOTE — ED Provider Notes (Signed)
EMERGENCY DEPARTMENT AT [facility] Provider Note   CSN: [id number] Arrival date & time: 03/15/23  4098     History {Add pertinent medical, surgical, social history, OB history to HPI:1} Chief Complaint  Patient presents with   Dysuria    Pamela Gallagher is a 37 y.o. female.  She has PMH of anxiety, endometriosis, reports history of frequent UTIs.  Presents ER today complaining of severe lower abdominal pain, having burning in vaginal area.  This is feels like prior UTIs but the pain in her lower abdomen is much more severe.  Pain started this morning, states she is expecting her period to start today.  Admits to nausea and vomiting at home secondary to severe pain.  She denies any vaginal discharge.   Dysuria      Home Medications Prior to Admission medications   Medication Sig Start Date End Date Taking? Authorizing Provider  azithromycin (ZITHROMAX Z-PAK) 250 MG tablet 2 tab today and  1-tab daily until done 02/16/23   Martina Sinner, NP  benzonatate (TESSALON PERLES) 100 MG capsule Take 1 capsule (100 mg total) by mouth 3 (three) times daily as needed for cough. 02/16/23   St Vena Austria, NP  Budeson-Glycopyrrol-Formoterol (BREZTRI AEROSPHERE) 160-9-4.8 MCG/ACT AERO Inhale 2 puffs into the lungs in the morning and at bedtime. With Spacer 05/27/21   Alfonse Spruce, MD  busPIRone (BUSPAR) 5 MG tablet TAKE 2 TABLETS BY MOUTH TWICE A DAY 01/12/23   Milian, Aleen Campi, FNP  Carbinoxamine Maleate 4 MG/5ML SOLN Take 20 mLs (16 mg total) by mouth at bedtime. 10/28/22   Milian, Aleen Campi, FNP  Clindamycin Phosphate (XACIATO) 2 % GEL INSERT 1 APPLICATORFUL EVERY DAY BY VAGINAL ROUTE FOR 1 DAY.    [provider]  fluticasone (FLONASE) 50 MCG/ACT nasal spray Place 2 sprays into both nostrils daily. 02/16/23   St Vena Austria, NP  gabapentin (NEURONTIN) 300 MG capsule TAKE 1 CAPSULE BY MOUTH THREE TIMES A DAY 01/19/23    Milian, Aleen Campi, FNP  hydrocortisone 1 % lotion Apply 1 application topically 2 (two) times daily. 03/11/21   Daryll Drown, NP  hydrOXYzine (ATARAX) 10 MG tablet TAKE 1 TABLET BY MOUTH EVERYDAY AT BEDTIME 08/08/21   Alfonse Spruce, MD  levocetirizine (XYZAL) 5 MG tablet Take 1 tablet (5 mg total) by mouth 2 (two) times daily as needed for allergies (Can take an extra dose during flare ups.). 05/27/21   Alfonse Spruce, MD  metroNIDAZOLE (METROGEL VAGINAL) 0.75 % vaginal gel Place 1 Applicatorful vaginally at bedtime. After sex or period 03/31/22   Cyril Mourning A, NP  sertraline (ZOLOFT) 100 MG tablet Take 2 tablets (200 mg total) by mouth daily. (NEEDS TO BE SEEN BEFORE NEXT REFILL) 11/21/22   Milian, Aleen Campi, FNP  tinidazole (TINDAMAX) 500 MG tablet Take 4 tablets every day by oral route for 2 days. 08/27/22   [provider]  tiZANidine (ZANAFLEX) 4 MG tablet TAKE 1-2 TABLETS EVERY EVENING. DX M79.7 12/24/22   Milian, Aleen Campi, FNP  traMADol (ULTRAM) 50 MG tablet Take 50 mg by mouth 3 (three) times daily as needed. 08/11/22   [provider]      Allergies    Clindamycin/lincomycin, Penicillins, and Sulfa antibiotics    Review of Systems   Review of Systems  Genitourinary:  Positive for dysuria.    Physical Exam Updated Vital Signs BP 91/79 (BP Location: Left Arm)   Pulse Marland Kitchen)  106   Temp (!) 97.3 F (36.3 C) (Temporal)   Resp (!) 22   Ht 5\' 3"  (1.6 m)   Wt 56.7 kg   LMP 02/22/2023 (Approximate)   SpO2 100%   BMI 22.14 kg/m  Physical Exam Vitals and nursing note reviewed.  Constitutional:      Appearance: She is well-developed.     Comments: Patient in obvious pain, clutching her lower abdomen and rocking back and forth  HENT:     Head: Normocephalic and atraumatic.     Mouth/Throat:     Mouth: Mucous membranes are moist.  Eyes:     Conjunctiva/sclera: Conjunctivae normal.     Pupils: Pupils are equal, round, and  reactive to light.  Cardiovascular:     Rate and Rhythm: Normal rate and regular rhythm.     Heart sounds: No murmur heard. Pulmonary:     Effort: Pulmonary effort is normal. No respiratory distress.     Breath sounds: Normal breath sounds.  Abdominal:     Palpations: Abdomen is soft.     Tenderness: There is no abdominal tenderness.  Musculoskeletal:        General: No swelling.     Cervical back: Neck supple.  Skin:    General: Skin is warm and dry.     Capillary Refill: Capillary refill takes less than 2 seconds.  Neurological:     General: No focal deficit present.     Mental Status: She is alert and oriented to person, place, and time.  Psychiatric:        Mood and Affect: Mood normal.     ED Results / Procedures / Treatments   Labs (all labs ordered are listed, but only abnormal results are displayed) Labs Reviewed  COMPREHENSIVE METABOLIC PANEL - Abnormal; Notable for the following components:      Result Value   CO2 19 (*)    All other components within normal limits  LACTIC ACID, PLASMA - Abnormal; Notable for the following components:   Lactic Acid, Venous 2.3 (*)    All other components within normal limits  CBC WITH DIFFERENTIAL/PLATELET - Abnormal; Notable for the following components:   WBC 14.7 (*)    RBC 5.23 (*)    Hemoglobin 16.1 (*)    HCT 47.2 (*)    Neutro Abs 11.0 (*)    Abs Immature Granulocytes 0.08 (*)    All other components within normal limits  URINALYSIS, W/ REFLEX TO CULTURE (INFECTION SUSPECTED) - Abnormal; Notable for the following components:   APPearance CLOUDY (*)    Specific Gravity, Urine 1.031 (*)    Ketones, ur 5 (*)    Protein, ur 100 (*)    Leukocytes,Ua LARGE (*)    Non Squamous Epithelial 0-5 (*)    All other components within normal limits  PREGNANCY, URINE - Abnormal; Notable for the following components:   Preg Test, Ur POSITIVE (*)    All other components within normal limits  CULTURE, BLOOD (ROUTINE X 2)  CULTURE,  BLOOD (ROUTINE X 2)  PROTIME-INR  LACTIC ACID, PLASMA  HCG, QUANTITATIVE, PREGNANCY  ABO/RH    EKG None  Radiology No results found.  Procedures Procedures  {Document cardiac monitor, telemetry assessment procedure when appropriate:1}  Medications Ordered in ED Medications  acetaminophen (TYLENOL) tablet 1,000 mg (1,000 mg Oral Given 03/15/23 1758)  lactated ringers bolus 1,000 mL (1,000 mLs Intravenous New Bag/Given 03/15/23 1811)  ondansetron (ZOFRAN-ODT) disintegrating tablet 4 mg (4 mg Oral Given 03/15/23 1758)  HYDROmorphone (DILAUDID) injection 0.5 mg (0.5 mg Intramuscular Given 03/15/23 1758)    ED Course/ Medical Decision Making/ A&P   {   Click here for ABCD2, HEART and other calculatorsREFRESH Note before signing :1}                              Medical Decision Making This patient presents to the ED for concern of lower abdominal pain, possible UTI, this involves an extensive number of treatment options, and is a complaint that carries with it a high risk of complications and morbidity.  The differential diagnosis includes UTI, sepsis, ureterolithiasis, ectopic pregnancy, ovarian torsion, other   Co morbidities that complicate the patient evaluation  Endometriosis   Additional history obtained:  Additional history obtained from EMR External records from outside source obtained and reviewed including prior notes and labs   Lab Tests:  I Ordered, and personally interpreted labs.  The pertinent results include: Pregnancy test is positive, initial lactic acid was 2.1, down to 1.1 on repeat, Rh+, urine has large leukocytes and greater than 50 red blood cells but no white blood cells, no bacteria and contain with squamous of the heel cells.  Quantitative hCG is 69,936, INR is normal, CMP has slightly decreased CO2 but otherwise normal, CBC shows leukocytosis of 14.7   Imaging Studies ordered:  I ordered imaging studies including chest x-ray, ultrasound  pelvis I independently visualized and interpreted imaging which showed x-ray shows no pulmonary edema or infiltrate, ultrasound pelvis shows IUP with small subchorionic hemorrhage and left 2.7 cm ovarian hemorrhagic cyst I agree with the radiologist interpretation     Problem List / ED Course / Critical interventions / Medication management  Pelvic pain-patient thought symptoms were due to UTI due to history of frequent UTIs but was found to be pregnant, has blood in the urine but does not with a UTI.  Given early pregnancy ultrasound ordered and shows 6-week IUP but also shows a hemorrhagic left ovarian cyst which likely cause of her pain, also noted a small subchorionic hemorrhage.  Patient was informed of her results.  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  She had complete resolution of her pelvic pain after a single dose of IM Dilaudid 0.5 mg and a dose of Tylenol.  She is elated to meet SIRS criteria so lactic acid was drawn initially slightly elevated but improved.  She has no source of infection.  Tachycardia likely due to pain and possibly due to volume contraction because of her vomiting.  Tachycardia has resolved, do not feel patient needs admission or antibiotics at this time.  Given strict return precautions.  I have reviewed the patients home medicines and have made adjustments as needed    Test / Admission - Considered:  After considering patient's labs and imaging, I feel she is stable for discharge especially given her resolution of symptoms, she would like to go home at this time.    Amount and/or Complexity of Data Reviewed Labs: ordered. Radiology: ordered.  Risk OTC drugs. Prescription drug management.   ***  {Document critical care time when appropriate:1} {Document review of labs and clinical decision tools ie heart score, Chads2Vasc2 etc:1}  {Document your independent review of  radiology images, and any outside records:1} {Document your discussion with family members, caretakers, and with consultants:1} {Document social determinants of health affecting pt's care:1} {Document your decision making why or  why not admission, treatments were needed:1} Final Clinical Impression(s) / ED Diagnoses Final diagnoses:  None    Rx / DC Orders ED Discharge Orders     None

## 2023-03-15 NOTE — ED Triage Notes (Signed)
Pt woke up with burning with urination, pt restless in chair in triage.  Pt states she has frequent UTI's in the past but not like this.

## 2023-03-15 NOTE — ED Notes (Signed)
Pt is aware we need urine specimen,unable to go at this time.

## 2023-03-15 NOTE — ED Provider Triage Note (Signed)
Emergency Medicine Provider Triage Evaluation Note  Pamela Gallagher , a 37 y.o. female  was evaluated in triage.  Pt complains of suprapubic and vaginal pain since this morning, has history of frequent UTIs but states this is much more severe also having bodyaches and chills..  Review of Systems  Positive: Dysuria, vaginal pain, chills, vomiting  negative: Fever, chest pain  Physical Exam  BP 91/79 (BP Location: Left Arm)   Pulse (!) 106   Temp (!) 97.3 F (36.3 C) (Temporal)   Resp (!) 22   Ht 5\' 3"  (1.6 m)   Wt 56.7 kg   LMP 02/22/2023 (Approximate)   SpO2 100%   BMI 22.14 kg/m  Gen:   Awake, no distress   Resp:  Normal effort  MSK:   Moves extremities without difficulty  Other:    Medical Decision Making  Medically screening exam initiated at 5:20 PM.  Appropriate orders placed.  Pamela Gallagher was informed that the remainder of the evaluation will be completed by another provider, this initial triage assessment does not replace that evaluation, and the importance of remaining in the ED until their evaluation is complete.     Ma Rings, [place] 03/15/23 1720

## 2023-03-20 LAB — CULTURE, BLOOD (ROUTINE X 2)
Culture: NO GROWTH
Culture: NO GROWTH
Special Requests: ADEQUATE
Special Requests: ADEQUATE

## 2023-04-07 ENCOUNTER — Telehealth (HOSPITAL_COMMUNITY): Payer: 59 | Admitting: Psychiatry

## 2023-04-16 ENCOUNTER — Ambulatory Visit: Payer: Managed Care, Other (non HMO) | Admitting: Family Medicine

## 2023-04-16 ENCOUNTER — Encounter: Payer: Self-pay | Admitting: Family Medicine

## 2023-04-16 ENCOUNTER — Ambulatory Visit: Payer: Self-pay | Admitting: Family Medicine

## 2023-04-16 VITALS — BP 92/63 | HR 84 | Temp 97.9°F | Ht 63.0 in | Wt 134.4 lb

## 2023-04-16 DIAGNOSIS — Z3491 Encounter for supervision of normal pregnancy, unspecified, first trimester: Secondary | ICD-10-CM

## 2023-04-16 DIAGNOSIS — R051 Acute cough: Secondary | ICD-10-CM

## 2023-04-16 DIAGNOSIS — J069 Acute upper respiratory infection, unspecified: Secondary | ICD-10-CM

## 2023-04-16 LAB — RAPID STREP SCREEN (MED CTR MEBANE ONLY): Strep Gp A Ag, IA W/Reflex: NEGATIVE

## 2023-04-16 LAB — RSV AG, IMMUNOCHR, WAIVED: RSV Ag, Immunochr, Waived: NEGATIVE

## 2023-04-16 LAB — VERITOR FLU A/B WAIVED
Influenza A: NEGATIVE
Influenza B: NEGATIVE

## 2023-04-16 LAB — CULTURE, GROUP A STREP

## 2023-04-16 NOTE — Telephone Encounter (Signed)
Copied from CRM [id number]. Topic: Clinical - Red Word Triage >> Apr 16, 2023  8:04 AM Ivette P wrote: Red Word that prompted transfer to Nurse Triage: Pregnancy, has cough and soar throat. Body aches.   Chief Complaint: dry cough Symptoms: body aches, ear ache, sore throat Frequency: ongoing for 2 days Pertinent Negatives: Patient denies fever shortness of breath or chest pain Disposition: [] ED /[] Urgent Care (no appt availability in office) / [x] Appointment(In office/virtual)/ []  [facility]/ [] Home Care/ [] Refused Recommended Disposition /[] [facility]/ []  Follow-up with PCP Additional Notes: The patient is [redacted] weeks pregnant and her daughter was diagnosed with RSV this past Friday.  Two days ago the patient started experiencing a dry cough, ear ache, sore throat, and body aches. She reported left side abdominal pain when coughing but stated that she is unsure if this is due to coughing so much or if it is related to her pregnancy as she has a cyst on her left ovary.  She reported coughing constantly and only able to cough up sputum sometimes; she was unable to sleep last night due to coughing.  She was scheduled for a same day appointment for further evaluation.   Reason for Disposition  [1] Continuous (nonstop) coughing interferes with work or school AND [2] no improvement using cough treatment per Care Advice  Answer Assessment - Initial Assessment Questions 1. ONSET: "When did the cough begin?"      2 days ago  2. SEVERITY: "How bad is the cough today?"      Coughing constantly; barely got sleep  3. SPUTUM: "Describe the color of your sputum" (none, dry cough; clear, white, yellow, green)     Greenish yellow  4. HEMOPTYSIS: "Are you coughing up any blood?" If so ask: "How much?" (flecks, streaks, tablespoons, etc.)     No 5. DIFFICULTY BREATHING: "Are you having difficulty breathing?" If Yes, ask: "How bad is it?" (e.g., mild, moderate, severe)    - MILD: No SOB at  rest, mild SOB with walking, speaks normally in sentences, can lie down, no retractions, pulse < 100.    - MODERATE: SOB at rest, SOB with minimal exertion and prefers to sit, cannot lie down flat, speaks in phrases, mild retractions, audible wheezing, pulse 100-120.    - SEVERE: Very SOB at rest, speaks in single words, struggling to breathe, sitting hunched forward, retractions, pulse > 120      No SOB 6. FEVER: "Do you have a fever?" If Yes, ask: "What is your temperature, how was it measured, and when did it start?"     No  7. CARDIAC HISTORY: "Do you have any history of heart disease?" (e.g., heart attack, congestive heart failure)      No 8. LUNG HISTORY: "Do you have any history of lung disease?"  (e.g., pulmonary embolus, asthma, emphysema)     No 10. OTHER SYMPTOMS: "Do you have any other symptoms?" (e.g., runny nose, wheezing, chest pain)       Ears hurting, wheezing, runny nose, body aches  Left side of stomach hurting  11. PREGNANCY: "Is there any chance you are pregnant?" "When was your last menstrual period?"       10 weeks  Protocols used: Cough - Acute Non-Productive-A-AH
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
## 2023-04-17 LAB — NOVEL CORONAVIRUS, NAA: SARS-CoV-2, NAA: NOT DETECTED

## 2023-04-22 NOTE — L&D Delivery Note (Signed)
 Operative Delivery Note At 10:36 AM a viable female was delivered via Vaginal, Vacuum Investment banker, operational).  Presentation: vertex; Position: Right,, Occiput,, Anterior; Station: +2.  Verbal consent: obtained from patient.  Risks and benefits discussed in detail.  Risks include, but are not limited to the risks of anesthesia, bleeding, infection, damage to maternal tissues, fetal cephalhematoma.  There is also the risk of inability to effect vaginal delivery of the head, or shoulder dystocia that cannot be resolved by established maneuvers, leading to the need for emergency cesarean section.  APGAR: 9, 9; weight 7 lb 4.1 oz (3290 g).   Placenta status: , .   Cord:  with the following complications: .  Cord pH: n/a  Anesthesia:  Epiduarl Instruments: Mity Vac Episiotomy: None Lacerations: 2nd degree;Perineal Suture Repair: 2.0 vicryl Est. Blood Loss (mL): 142  Mom to postpartum.  Baby to Couplet care / Skin to Skin.  Jon CINDERELLA Rummer 11/05/2023, 11:47 AM

## 2023-04-24 ENCOUNTER — Telehealth: Payer: Self-pay | Admitting: Professional Counselor

## 2023-04-24 NOTE — Telephone Encounter (Signed)
 Called to schedule a follow up appointment with Esmond Harps. Please schedule if patient returns call.

## 2023-04-28 LAB — OB RESULTS CONSOLE HIV ANTIBODY (ROUTINE TESTING): HIV: NONREACTIVE

## 2023-04-28 LAB — OB RESULTS CONSOLE GC/CHLAMYDIA
Chlamydia: NEGATIVE
Neisseria Gonorrhea: NEGATIVE

## 2023-04-28 LAB — OB RESULTS CONSOLE HEPATITIS B SURFACE ANTIGEN: Hepatitis B Surface Ag: NEGATIVE

## 2023-04-28 LAB — HEPATITIS C ANTIBODY: HCV Ab: NEGATIVE

## 2023-04-30 ENCOUNTER — Ambulatory Visit: Payer: Managed Care, Other (non HMO) | Admitting: Family Medicine

## 2023-05-16 ENCOUNTER — Other Ambulatory Visit: Payer: Self-pay | Admitting: Adult Health

## 2023-05-20 ENCOUNTER — Encounter (HOSPITAL_COMMUNITY): Payer: Self-pay

## 2023-05-20 ENCOUNTER — Inpatient Hospital Stay (HOSPITAL_COMMUNITY)
Admission: AD | Admit: 2023-05-20 | Discharge: 2023-05-20 | Disposition: A | Discharge status: Home / Self Care | Payer: Managed Care, Other (non HMO) | Attending: Obstetrics & Gynecology | Admitting: Obstetrics & Gynecology

## 2023-05-20 DIAGNOSIS — O218 Other vomiting complicating pregnancy: Secondary | ICD-10-CM | POA: Insufficient documentation

## 2023-05-20 DIAGNOSIS — O26892 Other specified pregnancy related conditions, second trimester: Secondary | ICD-10-CM | POA: Diagnosis present

## 2023-05-20 DIAGNOSIS — R309 Painful micturition, unspecified: Secondary | ICD-10-CM | POA: Diagnosis not present

## 2023-05-20 DIAGNOSIS — Z1152 Encounter for screening for COVID-19: Secondary | ICD-10-CM | POA: Insufficient documentation

## 2023-05-20 DIAGNOSIS — O99612 Diseases of the digestive system complicating pregnancy, second trimester: Secondary | ICD-10-CM | POA: Insufficient documentation

## 2023-05-20 DIAGNOSIS — K529 Noninfective gastroenteritis and colitis, unspecified: Secondary | ICD-10-CM | POA: Diagnosis not present

## 2023-05-20 DIAGNOSIS — O26899 Other specified pregnancy related conditions, unspecified trimester: Secondary | ICD-10-CM

## 2023-05-20 DIAGNOSIS — R102 Pelvic and perineal pain: Secondary | ICD-10-CM | POA: Insufficient documentation

## 2023-05-20 DIAGNOSIS — Z3A15 15 weeks gestation of pregnancy: Secondary | ICD-10-CM | POA: Diagnosis not present

## 2023-05-20 LAB — URINALYSIS, ROUTINE W REFLEX MICROSCOPIC
Bilirubin Urine: NEGATIVE
Glucose, UA: NEGATIVE mg/dL
Hgb urine dipstick: NEGATIVE
Ketones, ur: 20 mg/dL — AB
Leukocytes,Ua: NEGATIVE
Nitrite: NEGATIVE
Protein, ur: NEGATIVE mg/dL
Specific Gravity, Urine: 1.014 (ref 1.005–1.030)
pH: 7 (ref 5.0–8.0)

## 2023-05-20 LAB — RESP PANEL BY RT-PCR (RSV, FLU A&B, COVID)  RVPGX2
Influenza A by PCR: NEGATIVE
Influenza B by PCR: NEGATIVE
Resp Syncytial Virus by PCR: NEGATIVE
SARS Coronavirus 2 by RT PCR: NEGATIVE

## 2023-05-20 MED ORDER — LACTATED RINGERS IV BOLUS
1000.0000 mL | Freq: Once | INTRAVENOUS | Status: AC
  Administered 2023-05-20: 1000 mL via INTRAVENOUS

## 2023-05-20 MED ORDER — ONDANSETRON 4 MG PO TBDP: 4.0000 mg | ORAL_TABLET | Freq: Four times a day (QID) | ORAL | 0 refills | Status: DC | PRN

## 2023-05-20 MED ORDER — ACETAMINOPHEN 500 MG PO TABS
1000.0000 mg | ORAL_TABLET | Freq: Once | ORAL | Status: AC
  Administered 2023-05-20: 1000 mg via ORAL
  Filled 2023-05-20: qty 2

## 2023-05-20 MED ORDER — ONDANSETRON 4 MG PO TBDP
4.0000 mg | ORAL_TABLET | Freq: Once | ORAL | Status: AC
  Administered 2023-05-20: 4 mg via ORAL
  Filled 2023-05-20: qty 1

## 2023-05-20 MED ORDER — LACTATED RINGERS IV BOLUS: 1000.0000 mL | Freq: Once | INTRAVENOUS | Status: DC

## 2023-05-20 MED ORDER — CYCLOBENZAPRINE HCL 10 MG PO TABS: 10.0000 mg | ORAL_TABLET | Freq: Three times a day (TID) | ORAL | 2 refills | Status: DC | PRN

## 2023-05-20 NOTE — MAU Note (Addendum)
...  Pamela Gallagher is a 38 y.o. at [redacted]w[redacted]d here in MAU reporting: N/V for the past two days as well as bilateral lower abdominal pain that is sharp with walking and burning with urination. She reports not being able to keep much of anything down. She reports dizziness. Denies VB.   Next OB appointment on 2/6.  Onset of complaint: Two days Pain score: 6/10 lower abdomen  Vitals:   05/20/23 1626 05/20/23 1629  BP: (!) 97/59 101/65  Pulse: (!) 103 (!) 107  Resp:    Temp:    SpO2:       FHT: 151 doppler Lab orders placed from triage: UA, Resp Panel, Orthostatic VS
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
## 2023-06-05 ENCOUNTER — Ambulatory Visit: Payer: Managed Care, Other (non HMO) | Admitting: Family Medicine

## 2023-10-14 LAB — OB RESULTS CONSOLE GBS: GBS: NEGATIVE

## 2023-11-02 ENCOUNTER — Other Ambulatory Visit: Payer: Self-pay

## 2023-11-02 ENCOUNTER — Encounter (HOSPITAL_COMMUNITY): Payer: Self-pay | Admitting: Obstetrics and Gynecology

## 2023-11-02 ENCOUNTER — Inpatient Hospital Stay (HOSPITAL_COMMUNITY)
Admission: AD | Admit: 2023-11-02 | Discharge: 2023-11-02 | Disposition: A | Discharge status: Home / Self Care | Attending: Obstetrics and Gynecology | Admitting: Obstetrics and Gynecology

## 2023-11-02 DIAGNOSIS — O26893 Other specified pregnancy related conditions, third trimester: Secondary | ICD-10-CM | POA: Insufficient documentation

## 2023-11-02 DIAGNOSIS — M545 Low back pain, unspecified: Secondary | ICD-10-CM | POA: Insufficient documentation

## 2023-11-02 DIAGNOSIS — Z3A39 39 weeks gestation of pregnancy: Secondary | ICD-10-CM | POA: Diagnosis not present

## 2023-11-02 DIAGNOSIS — O09523 Supervision of elderly multigravida, third trimester: Secondary | ICD-10-CM | POA: Diagnosis not present

## 2023-11-02 DIAGNOSIS — O471 False labor at or after 37 completed weeks of gestation: Secondary | ICD-10-CM | POA: Insufficient documentation

## 2023-11-02 DIAGNOSIS — R519 Headache, unspecified: Secondary | ICD-10-CM

## 2023-11-02 DIAGNOSIS — M549 Dorsalgia, unspecified: Secondary | ICD-10-CM

## 2023-11-02 LAB — URINALYSIS, ROUTINE W REFLEX MICROSCOPIC
Bilirubin Urine: NEGATIVE
Glucose, UA: NEGATIVE mg/dL
Hgb urine dipstick: NEGATIVE
Ketones, ur: NEGATIVE mg/dL
Leukocytes,Ua: NEGATIVE
Nitrite: NEGATIVE
Protein, ur: NEGATIVE mg/dL
Specific Gravity, Urine: 1.002 — ABNORMAL LOW (ref 1.005–1.030)
pH: 7 (ref 5.0–8.0)

## 2023-11-02 MED ORDER — ACETAMINOPHEN-CAFFEINE 500-65 MG PO TABS
2.0000 | ORAL_TABLET | Freq: Once | ORAL | Status: AC
  Administered 2023-11-02: 2 via ORAL
  Filled 2023-11-02: qty 2

## 2023-11-02 MED ORDER — ONDANSETRON 4 MG PO TBDP
4.0000 mg | ORAL_TABLET | Freq: Once | ORAL | Status: AC
  Administered 2023-11-02: 4 mg via ORAL
  Filled 2023-11-02: qty 1

## 2023-11-02 MED ORDER — CYCLOBENZAPRINE HCL 5 MG PO TABS
10.0000 mg | ORAL_TABLET | Freq: Once | ORAL | Status: AC
  Administered 2023-11-02: 10 mg via ORAL
  Filled 2023-11-02: qty 2

## 2023-11-02 NOTE — MAU Note (Signed)
 Pamela Gallagher is a 38 y.o. at [redacted]w[redacted]d here in MAU reporting: has been having irregular contractions for two weeks but four days ago started having unbearable back pain. States I feel like my back is breaking. Hasn't been able to get into the OB office to be seen so came here. Also complains of migraine and nausea.    Pain score: 8/10 Vitals:   11/02/23 1425  BP: 117/83  Pulse: 82  Resp: 18  Temp: 98 F (36.7 C)  SpO2: 100%     FHT: 120  Lab orders placed from triage: UA

## 2023-11-02 NOTE — Discharge Instructions (Signed)
 [employer]  This circuit takes at least 90 minutes to complete so clear your schedule and make mental preparations so you can relax in your environment. The second step requires a lot of pillows so gather them up before beginning Before starting, you should empty your bladder! Have a nice drink nearby, and make sure it has a straw! If you are having contractions, this circuit should be done through contractions, try not to change positions between steps Before you begin...  "I named this 'circuit' after my friend Deneen Harts, who shared and discussed it with me when I was working with a client whose labor seemed to be stalled out and no longer progressing... This circuit is useful to help get the baby lined up, ideally, in the "Left Occiput Anterior" (LOA) Position, both before labor begins and when some corrections need to be done during labor. Prenatally, this position set can help to rotate a baby. As a natural method of induction, this can help get things going if baby just needed a gentle nudge of position to set things off. To the best of my knowledge, this group of positions will not "hurt" a baby that is already lined up correctly." - Sharlyn Bologna   Step One: Open-knee Chest Stay in this position for 30 minutes, start in cat/cow, then drop your chest as low as you can to the bed or the floor and your bottom as high as you can. Knees should be fairly wide apart, and the angle between the torso/thighs should be wider than 90 degrees. Wiggle around, prop with lots of pillows and use this time to get totally relaxed. This position allows the baby to scoot out of the pelvis a bit and gives them room to rotate, shift their head position, etc. If the pregnant person finds it helpful, careful positioning with a rebozo under the belly, with gentle tension from a support person behind can help maintain this position for the full 30 minutes.  Step [id number] Left Side  Lying Roll to your left side, bringing your top leg as high as possible and keeping your bottom leg straight. Roll forward as much as possible, again using a lot of pillows. Sink into the bed and relax some more. If you fall asleep, that's totally okay and you can stay there! If not, stay here for at least another half an hour. Try and get your top right leg up towards your head and get as rolled over onto your belly as much as possible. If you repeat the circuit during labor, try alternating left and right sides. We know the photo the left is actually right side... just flip the image in your head.  Step Three: Moving and Lunges Lunge, walk stairs facing sideways, 2 at a time, (have a spotter downstairs of you!), take a walk outside with one foot on the curb and the other on the street, sit on a birth ball and hula- anything that's upright and putting your pelvis in open, asymmetrical positions. Spend at least 30 minutes doing this one as well to give your baby a chance to move down. If you are lunging or stair or curb walking, you should lunge/walk/go up stairs in the direction that feels better to you. The key with the lunge is that the toes of the higher leg and mom's belly button should be at right angles. Do not lunge over your knee, that closes the pelvis.     Megan Hamilton Miles: Tourist information centre manager - [email or web address] Jasmine December  Muza, CD, BDT (DONA), LCCE, FACCE: Supporting Content - [email or web address] Rulon Eisenmenger: Photography - [email or web address] Luther Hearing CD/CDT [facility]): Print and Webmaster - [email or web address] [employer] [email or web address]
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
## 2023-11-05 ENCOUNTER — Inpatient Hospital Stay (HOSPITAL_COMMUNITY)
Admission: AD | Admit: 2023-11-05 | Discharge: 2023-11-07 | DRG: 807 | Disposition: A | Discharge status: Home / Self Care | Payer: Self-pay | Attending: Obstetrics and Gynecology | Admitting: Obstetrics and Gynecology

## 2023-11-05 ENCOUNTER — Other Ambulatory Visit: Payer: Self-pay

## 2023-11-05 ENCOUNTER — Inpatient Hospital Stay (HOSPITAL_COMMUNITY): Admitting: Anesthesiology

## 2023-11-05 ENCOUNTER — Encounter (HOSPITAL_COMMUNITY): Payer: Self-pay | Admitting: Obstetrics and Gynecology

## 2023-11-05 DIAGNOSIS — Z3A39 39 weeks gestation of pregnancy: Secondary | ICD-10-CM | POA: Diagnosis not present

## 2023-11-05 DIAGNOSIS — Z8249 Family history of ischemic heart disease and other diseases of the circulatory system: Secondary | ICD-10-CM

## 2023-11-05 DIAGNOSIS — O479 False labor, unspecified: Principal | ICD-10-CM | POA: Diagnosis present

## 2023-11-05 DIAGNOSIS — O26893 Other specified pregnancy related conditions, third trimester: Secondary | ICD-10-CM | POA: Diagnosis present

## 2023-11-05 DIAGNOSIS — K219 Gastro-esophageal reflux disease without esophagitis: Secondary | ICD-10-CM | POA: Diagnosis present

## 2023-11-05 DIAGNOSIS — O99334 Smoking (tobacco) complicating childbirth: Secondary | ICD-10-CM | POA: Diagnosis present

## 2023-11-05 DIAGNOSIS — O09529 Supervision of elderly multigravida, unspecified trimester: Secondary | ICD-10-CM

## 2023-11-05 DIAGNOSIS — F17210 Nicotine dependence, cigarettes, uncomplicated: Secondary | ICD-10-CM | POA: Diagnosis present

## 2023-11-05 DIAGNOSIS — O9962 Diseases of the digestive system complicating childbirth: Secondary | ICD-10-CM | POA: Diagnosis present

## 2023-11-05 LAB — CBC
HCT: 41.9 % (ref 36.0–46.0)
Hemoglobin: 14.3 g/dL (ref 12.0–15.0)
MCH: 30.2 pg (ref 26.0–34.0)
MCHC: 34.1 g/dL (ref 30.0–36.0)
MCV: 88.6 fL (ref 80.0–100.0)
Platelets: 328 K/uL (ref 150–400)
RBC: 4.73 MIL/uL (ref 3.87–5.11)
RDW: 12.6 % (ref 11.5–15.5)
WBC: 15.3 K/uL — ABNORMAL HIGH (ref 4.0–10.5)
nRBC: 0 % (ref 0.0–0.2)

## 2023-11-05 LAB — TYPE AND SCREEN
ABO/RH(D): O POS
Antibody Screen: NEGATIVE

## 2023-11-05 LAB — HIV ANTIBODY (ROUTINE TESTING W REFLEX): HIV Screen 4th Generation wRfx: NONREACTIVE

## 2023-11-05 LAB — RPR: RPR Ser Ql: NONREACTIVE

## 2023-11-05 MED ORDER — SENNOSIDES-DOCUSATE SODIUM 8.6-50 MG PO TABS
2.0000 | ORAL_TABLET | Freq: Every day | ORAL | Status: DC
  Administered 2023-11-06 – 2023-11-07 (×2): 2 via ORAL
  Filled 2023-11-05 (×3): qty 2

## 2023-11-05 MED ORDER — ZOLPIDEM TARTRATE 5 MG PO TABS: 5.0000 mg | ORAL_TABLET | Freq: Every evening | ORAL | Status: DC | PRN

## 2023-11-05 MED ORDER — TERBUTALINE SULFATE 1 MG/ML IJ SOLN
INTRAMUSCULAR | Status: AC
  Administered 2023-11-05: 0.25 mg via SUBCUTANEOUS
  Filled 2023-11-05: qty 1

## 2023-11-05 MED ORDER — IBUPROFEN 600 MG PO TABS
600.0000 mg | ORAL_TABLET | Freq: Four times a day (QID) | ORAL | Status: DC
  Administered 2023-11-05 – 2023-11-07 (×7): 600 mg via ORAL
  Filled 2023-11-05 (×8): qty 1

## 2023-11-05 MED ORDER — OXYCODONE-ACETAMINOPHEN 5-325 MG PO TABS: 1.0000 | ORAL_TABLET | ORAL | Status: DC | PRN

## 2023-11-05 MED ORDER — LACTATED RINGERS IV SOLN
500.0000 mL | INTRAVENOUS | Status: DC | PRN
  Administered 2023-11-05: 1000 mL via INTRAVENOUS

## 2023-11-05 MED ORDER — PRENATAL MULTIVITAMIN CH
1.0000 | ORAL_TABLET | Freq: Every day | ORAL | Status: DC
  Administered 2023-11-05 – 2023-11-07 (×3): 1 via ORAL
  Filled 2023-11-05 (×3): qty 1

## 2023-11-05 MED ORDER — FENTANYL-BUPIVACAINE-NACL 0.5-0.125-0.9 MG/250ML-% EP SOLN
EPIDURAL | Status: AC
  Filled 2023-11-05: qty 250

## 2023-11-05 MED ORDER — DIPHENHYDRAMINE HCL 25 MG PO CAPS: 25.0000 mg | ORAL_CAPSULE | Freq: Four times a day (QID) | ORAL | Status: DC | PRN

## 2023-11-05 MED ORDER — ACETAMINOPHEN 325 MG PO TABS: 650.0000 mg | ORAL_TABLET | ORAL | Status: DC | PRN

## 2023-11-05 MED ORDER — BENZOCAINE-MENTHOL 20-0.5 % EX AERO
1.0000 | INHALATION_SPRAY | CUTANEOUS | Status: DC | PRN
  Administered 2023-11-05 – 2023-11-07 (×2): 1 via TOPICAL
  Filled 2023-11-05 (×2): qty 56

## 2023-11-05 MED ORDER — SOD CITRATE-CITRIC ACID 500-334 MG/5ML PO SOLN: 30.0000 mL | ORAL | Status: DC | PRN

## 2023-11-05 MED ORDER — OXYCODONE HCL 5 MG PO TABS
10.0000 mg | ORAL_TABLET | ORAL | Status: DC | PRN
  Administered 2023-11-06 – 2023-11-07 (×4): 10 mg via ORAL
  Filled 2023-11-05 (×5): qty 2

## 2023-11-05 MED ORDER — FENTANYL CITRATE (PF) 100 MCG/2ML IJ SOLN
100.0000 ug | INTRAMUSCULAR | Status: DC | PRN
  Administered 2023-11-05: 100 ug via INTRAVENOUS
  Filled 2023-11-05: qty 2

## 2023-11-05 MED ORDER — FENTANYL-BUPIVACAINE-NACL 0.5-0.125-0.9 MG/250ML-% EP SOLN
EPIDURAL | Status: DC | PRN
  Administered 2023-11-05: 12 mL/h via EPIDURAL

## 2023-11-05 MED ORDER — ONDANSETRON HCL 4 MG/2ML IJ SOLN: 4.0000 mg | Freq: Four times a day (QID) | INTRAMUSCULAR | Status: DC | PRN

## 2023-11-05 MED ORDER — LACTATED RINGERS IV SOLN: INTRAVENOUS | Status: DC

## 2023-11-05 MED ORDER — ONDANSETRON HCL 4 MG/2ML IJ SOLN: 4.0000 mg | INTRAMUSCULAR | Status: DC | PRN

## 2023-11-05 MED ORDER — TERBUTALINE SULFATE 1 MG/ML IJ SOLN: 0.2500 mg | Freq: Once | INTRAMUSCULAR | Status: AC

## 2023-11-05 MED ORDER — ACETAMINOPHEN 325 MG PO TABS
650.0000 mg | ORAL_TABLET | ORAL | Status: DC | PRN
  Administered 2023-11-06 (×3): 650 mg via ORAL
  Filled 2023-11-05 (×3): qty 2

## 2023-11-05 MED ORDER — OXYTOCIN BOLUS FROM INFUSION
333.0000 mL | Freq: Once | INTRAVENOUS | Status: AC
  Administered 2023-11-05: 333 mL via INTRAVENOUS

## 2023-11-05 MED ORDER — LIDOCAINE HCL (PF) 1 % IJ SOLN
INTRAMUSCULAR | Status: DC | PRN
  Administered 2023-11-05 (×2): 5 mL via EPIDURAL

## 2023-11-05 MED ORDER — COCONUT OIL OIL: 1.0000 | TOPICAL_OIL | Status: DC | PRN

## 2023-11-05 MED ORDER — OXYCODONE HCL 5 MG PO TABS
5.0000 mg | ORAL_TABLET | ORAL | Status: DC | PRN
  Administered 2023-11-06 (×2): 5 mg via ORAL
  Filled 2023-11-05: qty 1

## 2023-11-05 MED ORDER — TETANUS-DIPHTH-ACELL PERTUSSIS 5-2.5-18.5 LF-MCG/0.5 IM SUSY: 0.5000 mL | PREFILLED_SYRINGE | Freq: Once | INTRAMUSCULAR | Status: DC

## 2023-11-05 MED ORDER — OXYCODONE-ACETAMINOPHEN 5-325 MG PO TABS: 2.0000 | ORAL_TABLET | ORAL | Status: DC | PRN

## 2023-11-05 MED ORDER — SIMETHICONE 80 MG PO CHEW
80.0000 mg | CHEWABLE_TABLET | ORAL | Status: DC | PRN
  Administered 2023-11-06: 80 mg via ORAL
  Filled 2023-11-05: qty 1

## 2023-11-05 MED ORDER — OXYTOCIN-SODIUM CHLORIDE 30-0.9 UT/500ML-% IV SOLN
2.5000 [IU]/h | INTRAVENOUS | Status: DC
  Filled 2023-11-05: qty 500

## 2023-11-05 MED ORDER — SODIUM CHLORIDE 0.9 % IV SOLN
12.5000 mg | Freq: Once | INTRAVENOUS | Status: DC
  Filled 2023-11-05: qty 0.5

## 2023-11-05 MED ORDER — LIDOCAINE HCL (PF) 1 % IJ SOLN: 30.0000 mL | INTRAMUSCULAR | Status: DC | PRN

## 2023-11-05 MED ORDER — WITCH HAZEL-GLYCERIN EX PADS: 1.0000 | MEDICATED_PAD | CUTANEOUS | Status: DC | PRN

## 2023-11-05 MED ORDER — DIBUCAINE (PERIANAL) 1 % EX OINT
1.0000 | TOPICAL_OINTMENT | CUTANEOUS | Status: DC | PRN
Start: 2023-11-05 — End: 2023-11-07
  Administered 2023-11-05: 1 via RECTAL
  Filled 2023-11-05: qty 28

## 2023-11-05 MED ORDER — ONDANSETRON HCL 4 MG PO TABS
4.0000 mg | ORAL_TABLET | ORAL | Status: DC | PRN
  Administered 2023-11-06: 4 mg via ORAL
  Filled 2023-11-05: qty 1
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
## 2023-11-05 NOTE — Anesthesia Postprocedure Evaluation (Signed)
 Anesthesia Post Note  Patient: Pamela Gallagher  Procedure(s) Performed: AN AD HOC LABOR EPIDURAL     Patient location during evaluation: Mother Baby Anesthesia Type: Epidural Level of consciousness: awake and alert Pain management: pain level controlled Vital Signs Assessment: post-procedure vital signs reviewed and stable Respiratory status: spontaneous breathing, nonlabored ventilation and respiratory function stable Cardiovascular status: stable Postop Assessment: no headache, no backache and epidural receding Anesthetic complications: no   No notable events documented.  Last Vitals:  Vitals:   11/05/23 1317 11/05/23 1717  BP: (!) 122/58 132/83  Pulse: 93 89  Resp: 16 16  Temp: 37.2 C 37.1 C  SpO2: 97% 98%    Last Pain:  Vitals:   11/05/23 1839  TempSrc:   PainSc: 5    Pain Goal:                   [name]

## 2023-11-05 NOTE — Anesthesia Procedure Notes (Signed)
 Epidural Patient location during procedure: OB Start time: 11/05/2023 6:48 AM End time: 11/05/2023 6:57 AM  Staffing Anesthesiologist: Jerrye Sharper, MD Performed: anesthesiologist   Preanesthetic Checklist Completed: patient identified, IV checked, site marked, risks and benefits discussed, surgical consent, monitors and equipment checked, pre-op evaluation and timeout performed  Epidural Patient position: sitting Prep: DuraPrep and site prepped and draped Patient monitoring: continuous pulse ox and blood pressure Approach: midline Location: L3-L4 Injection technique: LOR air  Needle:  Needle type: Tuohy  Needle gauge: 17 G Needle length: 9 cm and 9 Needle insertion depth: 5 cm Catheter type: closed end flexible Catheter size: 19 Gauge Catheter at skin depth: 10 cm Test dose: negative and Other  Assessment Events: blood not aspirated, no cerebrospinal fluid, injection not painful, no injection resistance, no paresthesia and negative IV test  Additional Notes Patient identified. Risks and benefits discussed including failed block, incomplete  Pain control, post dural puncture headache, nerve damage, paralysis, blood pressure Changes, nausea, vomiting, reactions to medications-both toxic and allergic and post Partum back pain. All questions were answered. Patient expressed understanding and wished to proceed. Sterile technique was used throughout procedure. Epidural site was Dressed with sterile barrier dressing. No paresthesias, signs of intravascular injection Or signs of intrathecal spread were encountered.  Patient was more comfortable after the epidural was dosed. Please see RN's note for documentation of vital signs and FHR which are stable.

## 2023-11-05 NOTE — Lactation Note (Signed)
 This note was copied from a baby's chart. Lactation Consultation Note  Patient Name: Pamela Gallagher Today's Date: 11/05/2023 Age:39 hours Reason for consult: Initial assessment  P2, Mother agreeable to assistance with latching. Attempted at latching, baby had a few sucks then went back to sleep.   Suggest keeping baby close to breast, watching for feeding cues. Lactation to follow up on MBU.  Maternal Data Does the patient have breastfeeding experience prior to this delivery?: Yes How long did the patient breastfeed?: 1.5 weeks  Feeding Mother's Current Feeding Choice: Breast Milk  Interventions Interventions: Assisted with latch;Skin to skin;Education Consult Status Consult Status: Follow-up from L&D   Shannon Levorn Lemme  RN, IBCLC 11/05/2023, 11:15 AM

## 2023-11-05 NOTE — MAU Note (Signed)
 Pamela Gallagher is a 38 y.o. at [redacted]w[redacted]d here in MAU reporting:    Pt states she has been having ctxs every 5 min.  No lof, no bleeding, +FM.  10/10 pain ctxs .  CCOB.  Gbs neg.  Desires epidural.   [id number]

## 2023-11-05 NOTE — MAU Provider Note (Signed)
 Patient is a 38 y.o. G2P1 at [redacted]w[redacted]d who presents with painful contractions, screaming with each one.  RNs have tried several times to call Dr Armond for admission and got no answer Dr Jayne consulted and recommends we admit patient for now and will turn over care later.

## 2023-11-05 NOTE — Anesthesia Preprocedure Evaluation (Signed)
 Anesthesia Evaluation  Patient identified by MRN, date of birth, ID band Patient awake    Reviewed: Allergy & Precautions, NPO status , Patient's Chart, lab work & pertinent test results  History of Anesthesia Complications (+) PONV and history of anesthetic complications  Airway Mallampati: II  TM Distance: >3 FB     Dental no notable dental hx. (+) Dental Advisory Given   Pulmonary asthma , Current Smoker and Patient abstained from smoking.   Pulmonary exam normal        Cardiovascular negative cardio ROS Normal cardiovascular exam     Neuro/Psych  Headaches PSYCHIATRIC DISORDERS Anxiety Depression       GI/Hepatic Neg liver ROS,GERD  ,,  Endo/Other  negative endocrine ROS    Renal/GU negative Renal ROS  negative genitourinary   Musculoskeletal negative musculoskeletal ROS (+)    Abdominal   Peds  Hematology negative hematology ROS (+)   Anesthesia Other Findings   Reproductive/Obstetrics (+) Pregnancy AMA                              Anesthesia Physical Anesthesia Plan  ASA: 2  Anesthesia Plan: Epidural   Post-op Pain Management:    Induction: Intravenous  PONV Risk Score and Plan: Treatment may vary due to age or medical condition  Airway Management Planned: Natural Airway  Additional Equipment: Fetal Monitoring and None  Intra-op Plan:   Post-operative Plan:   Informed Consent: I have reviewed the patients History and Physical, chart, labs and discussed the procedure including the risks, benefits and alternatives for the proposed anesthesia with the patient or authorized representative who has indicated his/her understanding and acceptance.       Plan Discussed with: Anesthesiologist  Anesthesia Plan Comments:          Anesthesia Quick Evaluation

## 2023-11-06 LAB — CBC
HCT: 34.6 % — ABNORMAL LOW (ref 36.0–46.0)
Hemoglobin: 11.7 g/dL — ABNORMAL LOW (ref 12.0–15.0)
MCH: 31 pg (ref 26.0–34.0)
MCHC: 33.8 g/dL (ref 30.0–36.0)
MCV: 91.5 fL (ref 80.0–100.0)
Platelets: 272 K/uL (ref 150–400)
RBC: 3.78 MIL/uL — ABNORMAL LOW (ref 3.87–5.11)
RDW: 12.8 % (ref 11.5–15.5)
WBC: 15.2 K/uL — ABNORMAL HIGH (ref 4.0–10.5)
nRBC: 0 % (ref 0.0–0.2)

## 2023-11-06 MED ORDER — CALCIUM CARBONATE ANTACID 500 MG PO CHEW
1.0000 | CHEWABLE_TABLET | Freq: Two times a day (BID) | ORAL | Status: DC
  Administered 2023-11-06: 200 mg via ORAL
  Filled 2023-11-06 (×2): qty 1

## 2023-11-06 MED ORDER — FAMOTIDINE 20 MG PO TABS
20.0000 mg | ORAL_TABLET | Freq: Two times a day (BID) | ORAL | Status: DC
  Administered 2023-11-07: 20 mg via ORAL
  Filled 2023-11-06 (×2): qty 1
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
## 2023-11-06 NOTE — Progress Notes (Signed)
 PPD# 1 VAVD w/ 2nd degree;Perineal  Information for the patient's newborn:  Cleona, Doubleday [[id number]]  female  Baby Name Concha Circumcision done   S:   Reports feeling sore Tolerating PO fluid and solids No nausea or vomiting Bleeding is light Pain controlled with acetaminophen  and ibuprofen  (OTC) Up ad lib / ambulatory / voiding w/o difficulty Feeding: formula    O:   VS: BP 102/73 (BP Location: Left Arm)   Pulse 91   Temp 98.4 F (36.9 C) (Oral)   Resp 18   Ht 5' 3 (1.6 m)   Wt 63.5 kg   LMP 02/22/2023 (Approximate)   SpO2 99%   Breastfeeding Unknown   BMI 24.80 kg/m   LABS:  Recent Labs    11/05/23 0605 11/06/23 0428  WBC 15.3* 15.2*  HGB 14.3 11.7*  PLT 328 272   Blood type: --/--/O POS (07/17 0604) Rubella:                        I&O: Intake/Output      07/17 0701 07/18 0700 07/18 0701 07/19 0700   I.V. (mL/kg) 751.8 (11.8)    Other 0    IV Piggyback 0    Total Intake(mL/kg) 751.8 (11.8)    Urine (mL/kg/hr) 440 (0.3)    Blood 142    Total Output 582    Net +169.8           Physical Exam: Alert and oriented X3 Lungs: Clear and unlabored Heart: regular rate and rhythm / no mumurs Abdomen: soft, non-tender, non-distended  Fundus: firm, non-tender, U-2 Perineum: healing Lochia: appropriate Extremities: trace edema, negative for calf pain, tenderness, or cords    A:  PPD # 1  Normal exam  P:  Routine postpartum orders Contraception: IUD or nexplanon Anticipate D/C on 09/07/23 Plan reviewed w/ Dr. Henry Mercer KATHEE Arnell, DNP, CNM 11/06/2023, 4:47 PM

## 2023-11-07 ENCOUNTER — Encounter (HOSPITAL_COMMUNITY): Payer: Self-pay | Admitting: Obstetrics and Gynecology

## 2023-11-07 DIAGNOSIS — O09529 Supervision of elderly multigravida, unspecified trimester: Secondary | ICD-10-CM

## 2023-11-07 LAB — BIRTH TISSUE RECOVERY COLLECTION (PLACENTA DONATION)

## 2023-11-07 MED ORDER — ACETAMINOPHEN 325 MG PO TABS: 650.0000 mg | ORAL_TABLET | ORAL | Status: DC | PRN

## 2023-11-07 MED ORDER — IBUPROFEN 600 MG PO TABS: 600.0000 mg | ORAL_TABLET | Freq: Four times a day (QID) | ORAL | 0 refills | Status: DC

## 2023-11-07 NOTE — Lactation Note (Signed)
 This note was copied from a baby's chart. Lactation Consultation Note  Patient Name: Pamela Gallagher Unijb'd Date: 11/07/2023 Age:39 hours Reason for consult: Follow-up assessment;Maternal discharge;Term  P2, 39 wks, @ 48 hrs of life. Mom originally no to [facility], okay with visit today- hand pump requested. Per mom this baby not taking to breast- sleeps. Discussed very normal to have been sleepy on first day. Discussed steps of latching. Encouraged mom to keep working on big mouth latch with baby and use EBM or coconut oil after each feed. Discussed cluster feeding overnight/ early morning brings in our milk supply, shared expectations of milk coming in. Highlighted risk of engorgement. Discussed hand pump/express to soften breasts, motrin  as anti-inflammatory, and ice packs for 10-20 minutes post feed/pumping if still over-full is the best treatments for inflamed/engorged breasts.  Maternal Data Does the patient have breastfeeding experience prior to this delivery?: Yes How long did the patient breastfeed?: Tramatic birth experience, and breast fed colostrum only- nipple damage  Feeding Mother's Current Feeding Choice: Breast Milk and Formula   Lactation Tools Discussed/Used Tools: Flanges;Pump Breast pump type: Manual Pump Education: Milk Storage;Setup, frequency, and cleaning  Interventions Interventions: Breast feeding basics reviewed;Hand express;Breast compression;Expressed milk;Coconut oil;Hand pump;DEBP;Education;LC Services brochure;CDC milk storage guidelines  Discharge Discharge Education: Engorgement and breast care Pump: Manual;DEBP;Personal (Has pump from last experience, hand pump provided with flange variety, highlighted sizing changes with breast changes)  Consult Status Consult Status: Complete Date: 11/07/23 Follow-up type: In-patient    Willy Pinkerton 11/07/2023, 11:22 AM

## 2023-11-07 NOTE — Discharge Summary (Signed)
 Postpartum Discharge Summary  Date of Service updated 11/07/23     Patient Name: Pamela Gallagher DOB: [birth year] MRN: [id number]  Date of admission: 11/05/2023 Delivery date:11/05/2023 Delivering provider: HENRY SLOUGH Date of discharge: 11/07/2023  Admitting diagnosis: Uterine contractions during pregnancy [O47.9] Intrauterine pregnancy: [redacted]w[redacted]d     Secondary diagnosis:  Principal Problem:   Normal labor and delivery Active Problems:   AMA (advanced maternal age) multigravida 35+   Vacuum-assisted vaginal delivery   Postpartum care following vaginal delivery  Additional problems: none    Discharge diagnosis: Term Pregnancy Delivered                                              Post partum procedures:none Augmentation: AROM Complications: None  Hospital course: Onset of Labor With Vaginal Delivery      38 y.o. yo [id number] at [redacted]w[redacted]d was admitted in Active Labor on 11/05/2023. Labor was precipitous and the course was uncomplicated  Membrane Rupture Time/Date: 7:20 AM,11/05/2023  Delivery Method:Vaginal, Vacuum (Extractor) Operative Delivery:Device used:MityVac Indication: Fetal indications Episiotomy: None Lacerations:  2nd degree;Perineal Patient had an uncomplicated postpartum course.  She is ambulating, tolerating a regular diet, passing flatus, and urinating well. Patient is discharged home in stable condition on 11/07/23.  Newborn Data: Birth date:11/05/2023 Birth time:10:36 AM Gender:Female Living status:Living Apgars:9 ,9  Weight:3290 g  Magnesium  Sulfate received: No BMZ received: No Rhophylac:N/A MMR:N/A RSV Vaccine received: No Transfusion:No Immunizations administered: Immunization History  Administered Date(s) Administered   Influenza Split 02/24/2018   Influenza, Seasonal, Injecte, Preservative Fre 02/20/2017, 02/24/2018   Influenza,inj,Quad PF,6+ Mos 02/20/2017, 02/24/2018   Influenza,inj,quad, With Preservative 02/24/2018   Influenza-Unspecified  02/19/2018   Tdap 10/13/2018    Physical exam  Vitals:   11/06/23 0530 11/06/23 1508 11/06/23 2020 11/07/23 0522  BP: 113/77 102/73 136/85 (!) 111/56  Pulse: 77 91 84 86  Resp: 18 18 18 18   Temp: 97.6 F (36.4 C) 98.4 F (36.9 C) 97.8 F (36.6 C) 98 F (36.7 C)  TempSrc: Oral Oral Oral Oral  SpO2: 100% 99% 100% 97%  Weight:      Height:       General: alert, cooperative, and no distress Lochia: appropriate Uterine Fundus: firm Incision: N/A DVT Evaluation: No evidence of DVT seen on physical exam. No cords or calf tenderness. No significant calf/ankle edema. Labs: Lab Results  Component Value Date   WBC 15.2 (H) 11/06/2023   HGB 11.7 (L) 11/06/2023   HCT 34.6 (L) 11/06/2023   MCV 91.5 11/06/2023   PLT 272 11/06/2023      Latest Ref Rng & Units 03/15/2023    5:19 PM  CMP  Glucose 70 - 99 mg/dL 86   BUN 6 - 20 mg/dL 10   Creatinine 9.55 - 1.00 mg/dL 9.14   Sodium 864 - 854 mmol/L 135   Potassium 3.5 - 5.1 mmol/L 3.5   Chloride 98 - 111 mmol/L 103   CO2 22 - 32 mmol/L 19   Calcium  8.9 - 10.3 mg/dL 9.3   Total Protein 6.5 - 8.1 g/dL 7.9   Total Bilirubin <8.7 mg/dL 0.7   Alkaline Phos 38 - 126 U/L 69   AST 15 - 41 U/L 21   ALT 0 - 44 U/L 13    Edinburgh Score:    11/05/2023    5:17 PM  Edinburgh Postnatal Depression  Scale Screening Tool  I have been able to laugh and see the funny side of things. 0  I have looked forward with enjoyment to things. 0  I have blamed myself unnecessarily when things went wrong. 0  I have been anxious or worried for no good reason. 1  I have felt scared or panicky for no good reason. 0  Things have been getting on top of me. 0  I have been so unhappy that I have had difficulty sleeping. 0  I have felt sad or miserable. 1  I have been so unhappy that I have been crying. 0  The thought of harming myself has occurred to me. 0  Edinburgh Postnatal Depression Scale Total 2      After visit meds:  Allergies as of 11/07/2023        Reactions   Clindamycin/lincomycin Shortness Of Breath   Penicillins Anaphylaxis   Sulfa Antibiotics Anaphylaxis        Medication List     STOP taking these medications    sertraline  100 MG tablet Commonly known as: ZOLOFT        TAKE these medications    acetaminophen  325 MG tablet Commonly known as: Tylenol  Take 2 tablets (650 mg total) by mouth every 4 (four) hours as needed (for pain scale < 4).   cyclobenzaprine  10 MG tablet Commonly known as: FLEXERIL  Take 1 tablet (10 mg total) by mouth 3 (three) times daily as needed for muscle spasms.   ibuprofen  600 MG tablet Commonly known as: ADVIL  Take 1 tablet (600 mg total) by mouth every 6 (six) hours.   ondansetron  4 MG disintegrating tablet Commonly known as: ZOFRAN -ODT Take 1 tablet (4 mg total) by mouth every 6 (six) hours as needed for nausea.   prenatal multivitamin Tabs tablet Take 1 tablet by mouth daily at 12 noon.         Discharge home in stable condition Infant Feeding: Breast for colostrum only then transition to formula Infant Disposition:home with mother Discharge instruction: per After Visit Summary and Postpartum booklet. Activity: Advance as tolerated. Pelvic rest for 6 weeks.  Diet: routine diet Anticipated Birth Control: Unsure Postpartum Appointment:6 weeks Additional Postpartum F/U: none Future Appointments:No future appointments. Follow up Visit:  Follow-up Information     [facility]. Schedule an appointment as soon as possible for a visit in 6 week(s).   Specialty: Obstetrics and Gynecology Contact information: [address] [place]  [address] [phone]                    11/07/2023 Mercer KATHEE Peal, CNM
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
## 2023-11-12 ENCOUNTER — Ambulatory Visit: Admitting: Nurse Practitioner

## 2023-11-12 ENCOUNTER — Encounter: Payer: Self-pay | Admitting: Nurse Practitioner

## 2023-11-12 VITALS — BP 120/76 | HR 97 | Temp 97.7°F | Ht 63.0 in | Wt 127.8 lb

## 2023-11-12 DIAGNOSIS — F411 Generalized anxiety disorder: Secondary | ICD-10-CM

## 2023-11-12 DIAGNOSIS — F332 Major depressive disorder, recurrent severe without psychotic features: Secondary | ICD-10-CM | POA: Diagnosis not present

## 2023-11-12 DIAGNOSIS — R52 Pain, unspecified: Secondary | ICD-10-CM | POA: Insufficient documentation

## 2023-11-12 DIAGNOSIS — M5441 Lumbago with sciatica, right side: Secondary | ICD-10-CM | POA: Diagnosis not present

## 2023-11-12 DIAGNOSIS — M5442 Lumbago with sciatica, left side: Secondary | ICD-10-CM

## 2023-11-12 DIAGNOSIS — F410 Panic disorder [episodic paroxysmal anxiety] without agoraphobia: Secondary | ICD-10-CM | POA: Diagnosis not present

## 2023-11-12 DIAGNOSIS — G8929 Other chronic pain: Secondary | ICD-10-CM

## 2023-11-12 DIAGNOSIS — D649 Anemia, unspecified: Secondary | ICD-10-CM | POA: Diagnosis not present

## 2023-11-12 MED ORDER — TIZANIDINE HCL 2 MG PO CAPS: 2.0000 mg | ORAL_CAPSULE | Freq: Three times a day (TID) | ORAL | 0 refills | Status: DC | PRN

## 2023-11-12 MED ORDER — PRENATAL MULTIVITAMIN CH: 1.0000 | ORAL_TABLET | Freq: Every day | ORAL | 0 refills | Status: AC

## 2023-11-12 MED ORDER — PRENATAL MULTIVITAMIN CH: 1.0000 | ORAL_TABLET | Freq: Every day | ORAL | 0 refills | Status: DC

## 2023-11-12 MED ORDER — HYDROXYZINE PAMOATE 25 MG PO CAPS: 25.0000 mg | ORAL_CAPSULE | Freq: Three times a day (TID) | ORAL | 0 refills | Status: DC | PRN

## 2023-11-12 MED ORDER — SERTRALINE HCL 25 MG PO TABS: 25.0000 mg | ORAL_TABLET | Freq: Every day | ORAL | 1 refills | Status: DC

## 2023-11-13 LAB — LIPID PANEL
Chol/HDL Ratio: 5 ratio — ABNORMAL HIGH (ref 0.0–4.4)
Cholesterol, Total: 294 mg/dL — ABNORMAL HIGH (ref 100–199)
HDL: 59 mg/dL (ref >39)
LDL Chol Calc (NIH): 166 mg/dL — ABNORMAL HIGH (ref 0–99)
Triglycerides: 361 mg/dL — ABNORMAL HIGH (ref 0–149)
VLDL Cholesterol Cal: 69 mg/dL — ABNORMAL HIGH (ref 5–40)

## 2023-11-13 LAB — VITAMIN D 25 HYDROXY (VIT D DEFICIENCY, FRACTURES): Vit D, 25-Hydroxy: 34.7 ng/mL (ref 30.0–100.0)

## 2023-11-13 LAB — THYROID PANEL WITH TSH
Free Thyroxine Index: 2.3 (ref 1.2–4.9)
T3 Uptake Ratio: 17 % — ABNORMAL LOW (ref 24–39)
T4, Total: 13.6 ug/dL — ABNORMAL HIGH (ref 4.5–12.0)
TSH: 1.37 u[IU]/mL (ref 0.450–4.500)

## 2023-11-16 ENCOUNTER — Ambulatory Visit: Payer: Self-pay | Admitting: Nurse Practitioner

## 2023-11-16 ENCOUNTER — Other Ambulatory Visit (HOSPITAL_COMMUNITY): Payer: Self-pay

## 2023-11-16 ENCOUNTER — Telehealth: Payer: Self-pay

## 2023-11-16 DIAGNOSIS — R946 Abnormal results of thyroid function studies: Secondary | ICD-10-CM | POA: Insufficient documentation

## 2023-11-16 NOTE — Telephone Encounter (Signed)
 Pharmacy Patient Advocate Encounter   Received notification from Onbase that prior authorization for tiZANidine  HCl 2MG  capsules  is required/requested.   Insurance verification completed.   The patient is insured through [facility] .   Per test claim:  Tizanidine  tablets is preferred by the insurance.  If suggested medication is appropriate, Please send in a new RX and discontinue this one. If not, please advise as to why it's not appropriate so that we may request a Prior Authorization. Please note, some preferred medications may still require a PA.  If the suggested medications have not been trialed and there are no contraindications to their use, the PA will not be submitted, as it will not be approved.

## 2023-11-17 ENCOUNTER — Other Ambulatory Visit: Payer: Self-pay | Admitting: Nurse Practitioner

## 2023-11-17 ENCOUNTER — Ambulatory Visit (INDEPENDENT_AMBULATORY_CARE_PROVIDER_SITE_OTHER): Admitting: Professional Counselor

## 2023-11-17 DIAGNOSIS — G8929 Other chronic pain: Secondary | ICD-10-CM

## 2023-11-17 DIAGNOSIS — F410 Panic disorder [episodic paroxysmal anxiety] without agoraphobia: Secondary | ICD-10-CM

## 2023-11-17 DIAGNOSIS — F411 Generalized anxiety disorder: Secondary | ICD-10-CM

## 2023-11-17 MED ORDER — TIZANIDINE HCL 4 MG PO TABS: 4.0000 mg | ORAL_TABLET | Freq: Four times a day (QID) | ORAL | 0 refills | Status: DC | PRN
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
## 2023-11-17 NOTE — Telephone Encounter (Signed)
 Left detailed message per signed DPR. Encouraged call back or send us  a Mychart message if there are any questions.

## 2023-11-20 NOTE — Patient Instructions (Signed)
 Pamela Gallagher, Pamela Gallagher,?NCC, [facility]   Address: [address] [place], [place] [address]   Phone: [phone]   Services: Individual/couples therapy, mental wellness and behavioral coaching    Website: [employer] in [place], [place] -  [email or web address]

## 2023-11-24 ENCOUNTER — Telehealth (HOSPITAL_COMMUNITY): Payer: Self-pay

## 2023-11-24 NOTE — Telephone Encounter (Signed)
 11/24/2023 1239  Name: Pamela Gallagher MRN: [id number] DOB: [birth year]  Reason for Call:  Transition of Care Hospital Discharge Call  Contact Status: Patient Contact Status: Message  Language assistant needed:          Follow-Up Questions:    [place] Postnatal Depression Scale:  In the Past 7 Days:    PHQ2-9 Depression Scale:     Discharge Follow-up:    Post-discharge interventions: NA  Signature  Rosaline Deretha PEAK

## 2023-12-01 ENCOUNTER — Ambulatory Visit: Admitting: Professional Counselor

## 2023-12-01 DIAGNOSIS — F411 Generalized anxiety disorder: Secondary | ICD-10-CM

## 2023-12-01 DIAGNOSIS — F410 Panic disorder [episodic paroxysmal anxiety] without agoraphobia: Secondary | ICD-10-CM

## 2023-12-01 NOTE — BH Specialist Note (Signed)
 [place] Follow-up  MRN: [id number] NAME: NAYDENE KAMROWSKI Date: 12/01/23  Start time: Start Time: 0200 End time: Stop Time: 0245 Total time: Total Time in Minutes (Visit): 45 Call number: Visit Number: Additional Visit  Reason for call today:  The patient is a 38 year old female presenting for a collaborative care follow-up. She arrives in a pleasant mood and reports feeling more stable since resuming her medication approximately one month ago. She shares that she may be starting a new job and is feeling optimistic overall.  She continues to experience stressors related to her relationship and the demands of parenting both a new baby and a five-year-old. The patient has been working on improving communication with her husband, practicing assertiveness, and encouraging him to share more equally in parenting and household responsibilities. She feels she is expressing her needs more clearly and setting firmer boundaries.  We discussed the possibility of long-term therapy to address unresolved trauma; however, for now, the focus remains on monitoring her medication response and providing brief interventions. The plan is to continue with a few more collaborative care sessions before transitioning her to traditional therapy.SABRA   PHQ-9 Scores:     11/17/2023    1:12 PM 11/12/2023    2:09 PM 02/17/2023    2:40 PM 02/16/2023   12:38 PM 01/05/2023   10:28 AM  Depression screen PHQ 2/9  Decreased Interest 1 1 3 1 2   Down, Depressed, Hopeless 1 1 3 1 2   PHQ - 2 Score 2 2 6 2 4   Altered sleeping 1 1 3 2 1   Tired, decreased energy 1 1 3 2 2   Change in appetite 1 1 3 1 1   Feeling bad or failure about yourself  0 0 3 0 1  Trouble concentrating 1 1 3 1 2   Moving slowly or fidgety/restless 0 0 2 1 1   Suicidal thoughts 0 0 0 0 1  PHQ-9 Score 6 6 23 9 13   Difficult doing work/chores Somewhat difficult Very difficult Very difficult Very difficult Somewhat difficult   GAD-7 Scores:     11/17/2023     1:14 PM 11/12/2023    2:09 PM 02/16/2023   12:38 PM 01/05/2023   10:29 AM  GAD 7 : Generalized Anxiety Score  Nervous, Anxious, on Edge 2 1 1 2   Control/stop worrying 1 2 1 2   Worry too much - different things 1 1 1 2   Trouble relaxing 1 1 2 2   Restless 0 0 0 2  Easily annoyed or irritable 1 0 1 1  Afraid - awful might happen 0 0 0 1  Total GAD 7 Score 6 5 6 12   Anxiety Difficulty Somewhat difficult Very difficult Very difficult Somewhat difficult    Stress Current stressors:  starting back work, new baby, relationship Sleep:  Good Appetite:  Good Coping ability:  Good Patient taking medications as prescribed:  yes   Current medications:  Outpatient Encounter Medications as of 12/01/2023  Medication Sig   hydrOXYzine  (VISTARIL ) 25 MG capsule Take 1 capsule (25 mg total) by mouth every 8 (eight) hours as needed.   Prenatal Vit-Fe Fumarate-FA (PRENATAL MULTIVITAMIN) TABS tablet Take 1 tablet by mouth daily.   sertraline  (ZOLOFT ) 25 MG tablet Take 1 tablet (25 mg total) by mouth daily.   tiZANidine  (ZANAFLEX ) 4 MG tablet Take 1 tablet (4 mg total) by mouth every 6 (six) hours as needed for muscle spasms.   No facility-administered encounter medications on file as of 12/01/2023.  Self-harm Behaviors Risk Assessment Self-harm risk factors:  None Patient endorses recent thoughts of harming self:  Denies  [place] Suicide Severity Rating Scale:  Flowsheet Row Integrated Behavioral Health from 11/17/2023 in [facility] Admission (Discharged) from 11/05/2023 in [place] 5S Mother Baby Unit Admission (Discharged) from 11/02/2023 in [facility] 1S Maternity Assessment Unit  C-SSRS RISK CATEGORY No Risk No Risk No Risk     Danger to Others Risk Assessment Danger to others risk factors:  None Patient endorses recent thoughts of harming others:  Denies   Substance Use Assessment Patient recently consumed alcohol:  no  Alcohol Use Disorder Identification Test  (AUDIT):     10/05/2012    6:33 PM 07/03/2021   11:10 AM 08/14/2022    9:09 AM 11/10/2022    2:51 PM 11/12/2023    2:33 PM  Alcohol Use Disorder Test (AUDIT)  1. How often do you have a drink containing alcohol? 1 1 1 1  0  2. How many drinks containing alcohol do you have on a typical day when you are drinking? 0 0 0 0 0  3. How often do you have six or more drinks on one occasion? 1 0 0 0 0  AUDIT-C Score 1  1 1 1  0     Data saved with a previous flowsheet row definition    Goals, Interventions and Follow-up Plan Goals: Increase healthy adjustment to current life circumstances Interventions: CBT Cognitive Behavioral Therapy and Medication Monitoring Follow-up Plan: Refer to [facility] Outpatient Therapy  Summary:   Redell JINNY Corn

## 2023-12-04 ENCOUNTER — Other Ambulatory Visit: Payer: Self-pay | Admitting: Nurse Practitioner

## 2023-12-04 DIAGNOSIS — F411 Generalized anxiety disorder: Secondary | ICD-10-CM

## 2023-12-04 DIAGNOSIS — F332 Major depressive disorder, recurrent severe without psychotic features: Secondary | ICD-10-CM

## 2023-12-14 ENCOUNTER — Telehealth (INDEPENDENT_AMBULATORY_CARE_PROVIDER_SITE_OTHER): Payer: Self-pay | Admitting: Professional Counselor

## 2023-12-14 DIAGNOSIS — F410 Panic disorder [episodic paroxysmal anxiety] without agoraphobia: Secondary | ICD-10-CM | POA: Diagnosis not present

## 2023-12-14 DIAGNOSIS — F411 Generalized anxiety disorder: Secondary | ICD-10-CM
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
## 2023-12-15 DIAGNOSIS — Z8759 Personal history of other complications of pregnancy, childbirth and the puerperium: Secondary | ICD-10-CM | POA: Insufficient documentation

## 2023-12-28 ENCOUNTER — Telehealth (INDEPENDENT_AMBULATORY_CARE_PROVIDER_SITE_OTHER): Admitting: Nurse Practitioner

## 2023-12-28 ENCOUNTER — Encounter: Payer: Self-pay | Admitting: Nurse Practitioner

## 2023-12-28 DIAGNOSIS — F410 Panic disorder [episodic paroxysmal anxiety] without agoraphobia: Secondary | ICD-10-CM

## 2023-12-28 DIAGNOSIS — F411 Generalized anxiety disorder: Secondary | ICD-10-CM

## 2023-12-28 DIAGNOSIS — F332 Major depressive disorder, recurrent severe without psychotic features: Secondary | ICD-10-CM | POA: Diagnosis not present

## 2023-12-28 MED ORDER — SERTRALINE HCL 50 MG PO TABS: 50.0000 mg | ORAL_TABLET | Freq: Every day | ORAL | 0 refills | Status: AC
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
## 2024-01-05 ENCOUNTER — Encounter: Payer: Self-pay | Admitting: Nurse Practitioner

## 2024-01-05 ENCOUNTER — Ambulatory Visit: Admitting: Professional Counselor

## 2024-01-11 ENCOUNTER — Emergency Department (HOSPITAL_COMMUNITY)
Admission: EM | Admit: 2024-01-11 | Discharge: 2024-01-11 | Disposition: A | Discharge status: Home / Self Care | Source: Ambulatory Visit | Attending: Emergency Medicine | Admitting: Emergency Medicine

## 2024-01-11 ENCOUNTER — Emergency Department (HOSPITAL_COMMUNITY)

## 2024-01-11 ENCOUNTER — Other Ambulatory Visit: Payer: Self-pay

## 2024-01-11 ENCOUNTER — Encounter (HOSPITAL_COMMUNITY): Payer: Self-pay | Admitting: Emergency Medicine

## 2024-01-11 DIAGNOSIS — R102 Pelvic and perineal pain: Secondary | ICD-10-CM | POA: Diagnosis not present

## 2024-01-11 DIAGNOSIS — R55 Syncope and collapse: Secondary | ICD-10-CM | POA: Insufficient documentation

## 2024-01-11 DIAGNOSIS — N939 Abnormal uterine and vaginal bleeding, unspecified: Secondary | ICD-10-CM | POA: Insufficient documentation

## 2024-01-11 LAB — TYPE AND SCREEN
ABO/RH(D): O POS
Antibody Screen: NEGATIVE

## 2024-01-11 LAB — COMPREHENSIVE METABOLIC PANEL WITH GFR
ALT: 12 U/L (ref 0–44)
AST: 20 U/L (ref 15–41)
Albumin: 4.4 g/dL (ref 3.5–5.0)
Alkaline Phosphatase: 77 U/L (ref 38–126)
Anion gap: 13 (ref 5–15)
BUN: 10 mg/dL (ref 6–20)
CO2: 23 mmol/L (ref 22–32)
Calcium: 9.4 mg/dL (ref 8.9–10.3)
Chloride: 102 mmol/L (ref 98–111)
Creatinine, Ser: 0.96 mg/dL (ref 0.44–1.00)
GFR, Estimated: >60 mL/min (ref >60)
Glucose, Bld: 84 mg/dL (ref 70–99)
Potassium: 3.7 mmol/L (ref 3.5–5.1)
Sodium: 138 mmol/L (ref 135–145)
Total Bilirubin: 0.9 mg/dL (ref 0.0–1.2)
Total Protein: 8.3 g/dL — ABNORMAL HIGH (ref 6.5–8.1)

## 2024-01-11 LAB — CBC WITH DIFFERENTIAL/PLATELET
Abs Immature Granulocytes: 0.02 K/uL (ref 0.00–0.07)
Basophils Absolute: 0.1 K/uL (ref 0.0–0.1)
Basophils Relative: 1 %
Eosinophils Absolute: 0.2 K/uL (ref 0.0–0.5)
Eosinophils Relative: 3 %
HCT: 47.3 % — ABNORMAL HIGH (ref 36.0–46.0)
Hemoglobin: 15.9 g/dL — ABNORMAL HIGH (ref 12.0–15.0)
Immature Granulocytes: 0 %
Lymphocytes Relative: 39 %
Lymphs Abs: 2.6 K/uL (ref 0.7–4.0)
MCH: 30.6 pg (ref 26.0–34.0)
MCHC: 33.6 g/dL (ref 30.0–36.0)
MCV: 91 fL (ref 80.0–100.0)
Monocytes Absolute: 0.4 K/uL (ref 0.1–1.0)
Monocytes Relative: 6 %
Neutro Abs: 3.5 K/uL (ref 1.7–7.7)
Neutrophils Relative %: 51 %
Platelets: 379 K/uL (ref 150–400)
RBC: 5.2 MIL/uL — ABNORMAL HIGH (ref 3.87–5.11)
RDW: 13.2 % (ref 11.5–15.5)
WBC: 6.8 K/uL (ref 4.0–10.5)
nRBC: 0 % (ref 0.0–0.2)

## 2024-01-11 LAB — URINALYSIS, ROUTINE W REFLEX MICROSCOPIC
Bilirubin Urine: NEGATIVE
Glucose, UA: NEGATIVE mg/dL
Ketones, ur: NEGATIVE mg/dL
Leukocytes,Ua: NEGATIVE
Nitrite: NEGATIVE
Protein, ur: NEGATIVE mg/dL
Specific Gravity, Urine: 1.005 (ref 1.005–1.030)
pH: 7 (ref 5.0–8.0)

## 2024-01-11 LAB — PREGNANCY, URINE: Preg Test, Ur: NEGATIVE

## 2024-01-11 LAB — HCG, SERUM, QUALITATIVE: Preg, Serum: NEGATIVE

## 2024-01-11 MED ORDER — HYDROCODONE-ACETAMINOPHEN 5-325 MG PO TABS: 2.0000 | ORAL_TABLET | Freq: Four times a day (QID) | ORAL | 0 refills | Status: DC | PRN

## 2024-01-11 MED ORDER — ONDANSETRON 4 MG PO TBDP: 4.0000 mg | ORAL_TABLET | Freq: Three times a day (TID) | ORAL | 0 refills | Status: AC | PRN

## 2024-01-11 MED ORDER — LACTATED RINGERS IV BOLUS
1000.0000 mL | Freq: Once | INTRAVENOUS | Status: AC
  Administered 2024-01-11: 1000 mL via INTRAVENOUS

## 2024-01-11 MED ORDER — HYDROCODONE-ACETAMINOPHEN 5-325 MG PO TABS
1.0000 | ORAL_TABLET | Freq: Once | ORAL | Status: AC
  Administered 2024-01-11: 1 via ORAL
  Filled 2024-01-11: qty 1

## 2024-01-11 NOTE — Discharge Instructions (Signed)
 As discussed, your evaluation today has been largely reassuring.  But, it is important that you monitor your condition carefully, and do not hesitate to return to the ED if you develop new, or concerning changes in your condition. ? ?Otherwise, please follow-up with your physician for appropriate ongoing care. ? ?

## 2024-01-11 NOTE — ED Provider Notes (Signed)
 [place] EMERGENCY DEPARTMENT AT [facility] Provider Note   CSN: [id number] Arrival date & time: 01/11/24  1348     Patient presents with: Vaginal Bleeding   Pamela Gallagher is a 38 y.o. female.   HPI Adult female presents with concern of vaginal bleeding and syncope. She notes that since delivery of healthy infant 2 months ago she has had ongoing bleeding.  There was a small interval of approximately 1 week where she did not have bleeding, but essentially she has had vaginal bleeding for almost 2 months. She has seen her physician, has tried multiple medications, and is currently taking oral anticontraceptive.  Today, at home, she had ongoing bleeding, felt lightheaded had an episode of syncope with fall onto carpet.  No focal weakness in any extremity, though she does describe generalized weakness. With ongoing bleeding, syncope she spoke with her physician and was sent here for evaluation.    Prior to Admission medications   Medication Sig Start Date End Date Taking? Authorizing Provider  HYDROcodone -acetaminophen  (NORCO/VICODIN) 5-325 MG tablet Take 2 tablets by mouth every 6 (six) hours as needed. 01/11/24  Yes Garrick Charleston, MD  ondansetron  (ZOFRAN -ODT) 4 MG disintegrating tablet Take 1 tablet (4 mg total) by mouth every 8 (eight) hours as needed for nausea or vomiting. 01/11/24  Yes Garrick Charleston, MD  hydrOXYzine  (VISTARIL ) 25 MG capsule TAKE 1 CAPSULE (25 MG TOTAL) BY MOUTH EVERY 8 (EIGHT) HOURS AS NEEDED. 12/04/23   [facility] Sebastian Pool, NP  Prenatal Vit-Fe Fumarate-FA (PRENATAL MULTIVITAMIN) TABS tablet Take 1 tablet by mouth daily. 11/12/23   [facility] Sebastian Pool, NP  sertraline  (ZOLOFT ) 50 MG tablet Take 1 tablet (50 mg total) by mouth daily. 12/28/23   [facility] Sebastian Pool, NP  tiZANidine  (ZANAFLEX ) 4 MG tablet Take 1 tablet (4 mg total) by mouth every 6 (six) hours as needed for muscle spasms. 11/17/23   [facility] Sebastian Pool, NP     Allergies: Clindamycin/lincomycin, Penicillins, and Sulfa antibiotics    Review of Systems  Updated Vital Signs BP (!) 113/93   Pulse 75   Temp 98.2 F (36.8 C) (Oral)   Resp 20   Ht 1.6 m (5' 3)   Wt 58.1 kg   LMP 02/22/2023 (Approximate)   SpO2 97%   Breastfeeding No   BMI 22.67 kg/m   Physical Exam Vitals and nursing note reviewed.  Constitutional:      General: She is not in acute distress.    Appearance: She is well-developed.  HENT:     Head: Normocephalic and atraumatic.  Eyes:     Conjunctiva/sclera: Conjunctivae normal.  Cardiovascular:     Rate and Rhythm: Normal rate and regular rhythm.  Pulmonary:     Effort: Pulmonary effort is normal. No respiratory distress.     Breath sounds: Normal breath sounds. No stridor.  Abdominal:     General: There is no distension.     Tenderness: There is no abdominal tenderness. There is no guarding.     Comments: Patient describes pain, though she has a nontender exam  Skin:    General: Skin is warm and dry.  Neurological:     Mental Status: She is alert and oriented to person, place, and time.     Cranial Nerves: No cranial nerve deficit.  Psychiatric:        Mood and Affect: Mood normal.     (all labs ordered are listed, but only abnormal results are displayed) Labs Reviewed  CBC WITH DIFFERENTIAL/PLATELET - Abnormal; Notable for the following components:      Result Value   RBC 5.20 (*)    Hemoglobin 15.9 (*)    HCT 47.3 (*)    All other components within normal limits  URINALYSIS, ROUTINE W REFLEX MICROSCOPIC - Abnormal; Notable for the following components:   Color, Urine STRAW (*)    Hgb urine dipstick LARGE (*)    Bacteria, UA RARE (*)    All other components within normal limits  COMPREHENSIVE METABOLIC PANEL WITH GFR - Abnormal; Notable for the following components:   Total Protein 8.3 (*)    All other components within normal limits  PREGNANCY, URINE  HCG, SERUM, QUALITATIVE  TYPE AND SCREEN     EKG: None  Radiology: US  PELVIC COMPLETE WITH TRANSVAGINAL Result Date: 01/11/2024 CLINICAL DATA:  Pelvic pain. Postpartum bleeding after delivery on 11/05/2023. LMP 02/22/2023. EXAM: TRANSABDOMINAL AND TRANSVAGINAL ULTRASOUND OF PELVIS TECHNIQUE: Both transabdominal and transvaginal ultrasound examinations of the pelvis were performed. Transabdominal technique was performed for global imaging of the pelvis including uterus, ovaries, adnexal regions, and pelvic cul-de-sac. It was necessary to proceed with endovaginal exam following the transabdominal exam to visualize the ovaries and endometrium. COMPARISON:  Obstetrical ultrasound 03/15/2023. CT abdomen and pelvis 06/27/2022 FINDINGS: Uterus Measurements: 10.9 x 5.3 x 6.2 cm = volume: 185 mL. Uterus is anteverted. No focal lesion identified. Small nabothian cysts in the cervix. Endometrium Thickness: 5 mm. Trace free fluid demonstrated in the endometrium. No focal lesions identified. Right ovary Measurements: 2.6 x 3.6 x 2.1 cm = volume: 10 mL. Normal appearance. No mass or focal abnormality. Flow is demonstrated on color flow Doppler imaging. Left ovary Measurements: 3.4 x 1.4 x 1.9 cm = volume: 4.6 mL. Left ovary is only visualized on transabdominal imaging but appears normal. No adnexal masses are demonstrated. Flow is demonstrated on color flow Doppler imaging. Other findings Small amount of free fluid in the pelvis.  No loculated collections. IMPRESSION: 1. Normal ultrasound appearance of the uterus and ovaries. No abnormal adnexal masses. 2. Normal endometrial stripe thickness with trace fluid in the endometrium, nonspecific. 3. Small amount of free fluid in the pelvis is most likely physiologic. Electronically Signed   By: Elsie Gravely M.D.   On: 01/11/2024 16:02     .Pelvic exam  Date/Time: 01/11/2024 4:39 PM  Performed by: Garrick Charleston, MD Authorized by: Garrick Charleston, MD  Consent: Verbal consent obtained Risks and benefits:  risks, benefits and alternatives were discussed Consent given by: patient Patient understanding: patient states understanding of the procedure being performed Patient consent: the patient's understanding of the procedure matches consent given Procedure consent: procedure consent matches procedure scheduled Required items: required blood products, implants, devices, and special equipment available Patient identity confirmed: verbally with patient Time out: Immediately prior to procedure a time out was called to verify the correct patient, procedure, equipment, support staff and site/side marked as required. Preparation: Patient was prepped and draped in the usual sterile fashion. Local anesthesia used: no  Anesthesia: Local anesthesia used: no  Sedation: Patient sedated: no  Patient tolerance: patient tolerated the procedure well with no immediate complications Comments: With nurse tech present pelvic exam performed.  Patient with slightly dilated cervix, no obvious mucosal changes, no obvious mucosal bleeding.  There is trace blood in the cervical os, no substantial reaccumulation.      Medications Ordered in the ED  HYDROcodone -acetaminophen  (NORCO/VICODIN) 5-325 MG per tablet 1 tablet (has no administration in time range)  lactated ringers  bolus 1,000 mL (0 mLs Intravenous Stopped 01/11/24 1439)                                    Medical Decision Making Postpartum female, now 2 months with ongoing bleeding since that time with new development of syncope.  Concern for retained products of conception versus other intra-abdominal process, including endometriosis, as the patient notes she has a history of this.  With concern for syncope, less likely cardiogenic possibility given consideration of anemia.  Patient had labs ultrasound monitoring. Cardiac 80 sinus normal pulse ox 98% room air normal  Amount and/or Complexity of Data Reviewed External Data Reviewed: notes. Labs:  ordered. Decision-making details documented in ED Course. Radiology: ordered and independent interpretation performed. Decision-making details documented in ED Course.  Risk Prescription drug management.   4:44 PM Patient in no distress, hemodynamically unremarkable, final results not available, ultrasound reviewed, unremarkable, labs with reassuring hemoglobin value, she has had no decompensation for hours of monitoring here.  I have discussed the patient's case with her OB team via telephone and they will reach out to the patient for close outpatient follow-up.  Patient does request analgesics, antiemetics on discharge, this was accommodated.     Final diagnoses:  Vaginal bleeding    ED Discharge Orders          Ordered    HYDROcodone -acetaminophen  (NORCO/VICODIN) 5-325 MG tablet  Every 6 hours PRN        01/11/24 1643    ondansetron  (ZOFRAN -ODT) 4 MG disintegrating tablet  Every 8 hours PRN        01/11/24 1643               Garrick Charleston, MD 01/11/24 1644

## 2024-01-11 NOTE — ED Notes (Signed)
 EDP at [facility]

## 2024-01-11 NOTE — ED Triage Notes (Signed)
 Pt sent from her ob for vaginal bleeding. Pt has soaked through 2 packs of heavy pads in the last 48 hours. Pt also had a syncopal episode this am. Denies hitting her head. Does not take blood thinners. Pt sates she continues to bleed out blood clots. Pt had delivered a baby in July and has had issues with post partum hemorrage  ince.

## 2024-01-12 ENCOUNTER — Other Ambulatory Visit: Payer: Self-pay | Admitting: Nurse Practitioner

## 2024-01-12 DIAGNOSIS — F410 Panic disorder [episodic paroxysmal anxiety] without agoraphobia: Secondary | ICD-10-CM
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
## 2024-01-25 ENCOUNTER — Ambulatory Visit: Admitting: Nurse Practitioner

## 2024-01-28 ENCOUNTER — Other Ambulatory Visit (HOSPITAL_COMMUNITY): Payer: Self-pay

## 2024-03-07 ENCOUNTER — Telehealth: Payer: Self-pay

## 2024-03-07 ENCOUNTER — Ambulatory Visit: Admitting: Nurse Practitioner

## 2024-03-07 ENCOUNTER — Encounter: Payer: Self-pay | Admitting: Nurse Practitioner

## 2024-03-07 VITALS — BP 116/76 | HR 92 | Temp 97.3°F | Ht 63.0 in | Wt 132.0 lb

## 2024-03-07 DIAGNOSIS — J4521 Mild intermittent asthma with (acute) exacerbation: Secondary | ICD-10-CM | POA: Diagnosis not present

## 2024-03-07 DIAGNOSIS — J209 Acute bronchitis, unspecified: Secondary | ICD-10-CM | POA: Diagnosis not present

## 2024-03-07 DIAGNOSIS — J069 Acute upper respiratory infection, unspecified: Secondary | ICD-10-CM

## 2024-03-07 MED ORDER — GUAIFENESIN 400 MG PO TABS
400.0000 mg | ORAL_TABLET | Freq: Four times a day (QID) | ORAL | 0 refills | Status: AC | PRN
Start: 2024-03-07 — End: ?

## 2024-03-07 MED ORDER — DOXYCYCLINE MONOHYDRATE 100 MG PO TABS: 100.0000 mg | ORAL_TABLET | Freq: Two times a day (BID) | ORAL | 0 refills | Status: DC

## 2024-03-07 MED ORDER — PREDNISONE 10 MG (21) PO TBPK: ORAL_TABLET | ORAL | 0 refills | Status: AC

## 2024-03-07 MED ORDER — AZELASTINE HCL 0.1 % NA SOLN: 1.0000 | Freq: Two times a day (BID) | NASAL | 5 refills | Status: AC

## 2024-03-07 NOTE — Telephone Encounter (Signed)
 Copied from CRM #[id number]. Topic: General - Other >> Mar 07, 2024  2:55 PM Sophia H wrote: Reason for CRM: Patient is needing doctors note, requesting it be sent through my chart. TY - seen in office today and please ok to return Wednesday once medication is fully in her system. TY

## 2024-03-07 NOTE — Telephone Encounter (Signed)
 Pt informed via mychart

## 2024-03-07 NOTE — Progress Notes (Signed)
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 cough or sneeze. Touching something that has the germ on it (is contaminated) and then touching your mouth, nose, or eyes. What increases the risk? You are more likely to get a URI if: You are very young or very old. You have close contact with others, such as at work, school, or a health care facility. You smoke. You have long-term (chronic) heart or lung disease. You have a weakened disease-fighting system (immune system). You have nasal allergies or asthma. You have a lot of stress. You have poor nutrition. What are the signs or symptoms? Runny or stuffy (congested) nose. Cough. Sneezing. Sore throat. Headache. Feeling tired (fatigue). Fever. Not wanting to eat as much as usual. Pain in your forehead, behind your eyes, and over your cheekbones (sinus pain). Muscle aches. Redness or irritation of the  eyes. Pressure in the ears or face. How is this treated? URIs usually get better on their own within 7-10 days. Medicines cannot cure URIs, but your doctor may recommend certain medicines to help relieve symptoms, such as: Over-the-counter cold medicines. Medicines to reduce coughing (cough suppressants). Coughing is a type of defense against infection that helps to clear the nose, throat, windpipe, and lungs (respiratory system). Take these medicines only as told by your doctor. Medicines to lower your fever. Follow these instructions at home: Activity Rest as needed. If you have a fever, stay home from work or school until your fever is gone, or until your doctor says you may return to work or school. You should stay home until you cannot spread the infection anymore (you are not contagious). Your doctor may have you wear a face mask so you have less risk of spreading the infection. Relieving symptoms Rinse your mouth often with salt water. To  make salt water, dissolve -1 tsp (3-6 g) of salt in 1 cup (237 mL) of warm water. Use a cool-mist humidifier to add moisture to the air. This can help you breathe more easily. Eating and drinking  Drink enough fluid to keep your pee (urine) pale yellow. Eat soups and other clear broths. General instructions  Take over-the-counter and prescription medicines only as told by your doctor. Do not smoke or use any products that contain nicotine  or tobacco. If you need help quitting, ask your doctor. Avoid being where people are smoking (avoid secondhand smoke). Stay up to date on all your shots (immunizations), and get the flu shot every year. Keep all follow-up visits. How to prevent the spread of infection to others  Wash your hands with soap and water for at least 20 seconds. If you cannot use soap and water, use hand sanitizer. Avoid touching your mouth, face, eyes, or nose. Cough or sneeze into a tissue or your sleeve or elbow. Do not cough or  sneeze into your hand or into the air. Contact a doctor if: You are getting worse, not better. You have any of these: A fever or chills. Brown or red mucus in your nose. Yellow or brown fluid (discharge)coming from your nose. Pain in your face, especially when you bend forward. Swollen neck glands. Pain when you swallow. White areas in the back of your throat. Get help right away if: You have shortness of breath that gets worse. You have very bad or constant: Headache. Ear pain. Pain in your forehead, behind your eyes, and over your cheekbones (sinus pain). Chest pain. You have long-lasting (chronic) lung disease along with any of these: Making high-pitched whistling sounds when you breathe, most often when you breathe out (wheezing). Long-lasting cough (more than 14 days). Coughing up blood. A change in your usual mucus. You have a stiff neck. You have changes in your: Vision. Hearing. Thinking. Mood. These symptoms may be an emergency. Get help right away. Call 911. Do not wait to see if the symptoms will go away. Do not drive yourself to the hospital. Summary An upper respiratory infection (URI) is caused by a germ (virus). The most common type of URI is often called the common cold. URIs usually get better within 7-10 days. Take over-the-counter and prescription medicines only as told by your doctor. This information is not intended to replace advice given to you by your health care provider. Make sure you discuss any questions you have with your health care provider. Document Revised: 11/07/2020 Document Reviewed: 11/07/2020 Elsevier Patient Education  2024 [employer]  The above assessment and management plan was discussed with the patient. The patient verbalized understanding of and has agreed to the management plan. Patient is aware to call the clinic if they develop any new symptoms or if symptoms persist or worsen. Patient is aware when to return to the clinic for  a follow-up visit. Patient educated on when it is appropriate to go to the emergency department.    Firas Guardado [place] Thompson, DNP [facility] [address] [place], [place] [address] [phone]

## 2024-03-08 ENCOUNTER — Telehealth: Payer: Self-pay | Admitting: Pharmacy Technician

## 2024-03-08 ENCOUNTER — Other Ambulatory Visit (HOSPITAL_COMMUNITY): Payer: Self-pay

## 2024-03-08 ENCOUNTER — Other Ambulatory Visit: Payer: Self-pay | Admitting: Nurse Practitioner

## 2024-03-08 DIAGNOSIS — J209 Acute bronchitis, unspecified: Secondary | ICD-10-CM

## 2024-03-08 DIAGNOSIS — J069 Acute upper respiratory infection, unspecified: Secondary | ICD-10-CM

## 2024-03-08 MED ORDER — DOXYCYCLINE HYCLATE 100 MG PO CAPS: 100.0000 mg | ORAL_CAPSULE | Freq: Two times a day (BID) | ORAL | 0 refills | Status: AC

## 2024-03-08 NOTE — Telephone Encounter (Signed)
 Pharmacy Patient Advocate Encounter   Received notification from Onbase that prior authorization for Doxycycline  Monohydrate 100MG  tablets is required/requested.   Insurance verification completed.   The patient is insured through [employer].   Per test claim:  Below is a list of what is preferred by the insurance.  If suggested medication is appropriate, Please send in a new RX and discontinue this one. If not, please advise as to why it's not appropriate so that we may request a Prior Authorization. Please note, some preferred medications may still require a PA.  If the suggested medications have not been trialed and there are no contraindications to their use, the PA will not be submitted, as it will not be approved.  **meds on the left preferred, meds on right not preferred**

## 2024-03-08 NOTE — Telephone Encounter (Signed)
 Sent mychart message to patient, making her aware of this.

## 2024-03-08 NOTE — Telephone Encounter (Signed)
 Tried calling patient to make her aware that alternative Rx was sent in to the pharmacy but no answer and voicemail full.

## 2024-03-08 NOTE — Progress Notes (Signed)
 Insurance will  not cover Adoxa; Doxy cap sent to her pharmacy

## 2024-03-09 ENCOUNTER — Other Ambulatory Visit: Payer: Self-pay | Admitting: Nurse Practitioner

## 2024-03-09 DIAGNOSIS — J209 Acute bronchitis, unspecified: Secondary | ICD-10-CM

## 2024-03-09 MED ORDER — ALBUTEROL SULFATE HFA 108 (90 BASE) MCG/ACT IN AERS: 2.0000 | INHALATION_SPRAY | Freq: Four times a day (QID) | RESPIRATORY_TRACT | 2 refills | Status: AC | PRN

## 2024-05-02 ENCOUNTER — Emergency Department (HOSPITAL_COMMUNITY)
Admission: EM | Admit: 2024-05-02 | Discharge: 2024-05-02 | Disposition: A | Discharge status: Home / Self Care | Source: Ambulatory Visit | Attending: Emergency Medicine | Admitting: Emergency Medicine

## 2024-05-02 ENCOUNTER — Other Ambulatory Visit: Payer: Self-pay | Admitting: Nurse Practitioner

## 2024-05-02 ENCOUNTER — Encounter (HOSPITAL_COMMUNITY): Payer: Self-pay

## 2024-05-02 DIAGNOSIS — R102 Pelvic and perineal pain unspecified side: Secondary | ICD-10-CM

## 2024-05-02 DIAGNOSIS — N920 Excessive and frequent menstruation with regular cycle: Secondary | ICD-10-CM | POA: Diagnosis not present

## 2024-05-02 DIAGNOSIS — R1030 Lower abdominal pain, unspecified: Secondary | ICD-10-CM | POA: Diagnosis present

## 2024-05-02 DIAGNOSIS — G8929 Other chronic pain: Secondary | ICD-10-CM

## 2024-05-02 LAB — CBC WITH DIFFERENTIAL/PLATELET
Abs Immature Granulocytes: 0.02 K/uL (ref 0.00–0.07)
Basophils Absolute: 0.1 K/uL (ref 0.0–0.1)
Basophils Relative: 1 %
Eosinophils Absolute: 0.4 K/uL (ref 0.0–0.5)
Eosinophils Relative: 5 %
HCT: 47 % — ABNORMAL HIGH (ref 36.0–46.0)
Hemoglobin: 15.6 g/dL — ABNORMAL HIGH (ref 12.0–15.0)
Immature Granulocytes: 0 %
Lymphocytes Relative: 32 %
Lymphs Abs: 2.3 K/uL (ref 0.7–4.0)
MCH: 30.2 pg (ref 26.0–34.0)
MCHC: 33.2 g/dL (ref 30.0–36.0)
MCV: 90.9 fL (ref 80.0–100.0)
Monocytes Absolute: 0.4 K/uL (ref 0.1–1.0)
Monocytes Relative: 5 %
Neutro Abs: 4 K/uL (ref 1.7–7.7)
Neutrophils Relative %: 57 %
Platelets: 302 K/uL (ref 150–400)
RBC: 5.17 MIL/uL — ABNORMAL HIGH (ref 3.87–5.11)
RDW: 13.2 % (ref 11.5–15.5)
WBC: 7.1 K/uL (ref 4.0–10.5)
nRBC: 0 % (ref 0.0–0.2)

## 2024-05-02 LAB — COMPREHENSIVE METABOLIC PANEL WITH GFR
ALT: 7 U/L (ref 0–44)
AST: 19 U/L (ref 15–41)
Albumin: 4.5 g/dL (ref 3.5–5.0)
Alkaline Phosphatase: 74 U/L (ref 38–126)
Anion gap: 13 (ref 5–15)
BUN: 11 mg/dL (ref 6–20)
CO2: 24 mmol/L (ref 22–32)
Calcium: 9.1 mg/dL (ref 8.9–10.3)
Chloride: 105 mmol/L (ref 98–111)
Creatinine, Ser: 1 mg/dL (ref 0.44–1.00)
GFR, Estimated: >60 mL/min
Glucose, Bld: 80 mg/dL (ref 70–99)
Potassium: 4.3 mmol/L (ref 3.5–5.1)
Sodium: 141 mmol/L (ref 135–145)
Total Bilirubin: 0.3 mg/dL (ref 0.0–1.2)
Total Protein: 7.3 g/dL (ref 6.5–8.1)

## 2024-05-02 LAB — URINALYSIS, ROUTINE W REFLEX MICROSCOPIC
Bacteria, UA: NONE SEEN
Bilirubin Urine: NEGATIVE
Glucose, UA: NEGATIVE mg/dL
Ketones, ur: NEGATIVE mg/dL
Nitrite: NEGATIVE
Protein, ur: NEGATIVE mg/dL
Specific Gravity, Urine: 1.012 (ref 1.005–1.030)
pH: 6 (ref 5.0–8.0)

## 2024-05-02 LAB — POC URINE PREG, ED: Preg Test, Ur: NEGATIVE

## 2024-05-02 LAB — LIPASE, BLOOD: Lipase: 49 U/L (ref 11–51)

## 2024-05-02 MED ORDER — ONDANSETRON HCL 4 MG/2ML IJ SOLN
4.0000 mg | Freq: Once | INTRAMUSCULAR | Status: AC
  Administered 2024-05-02: 4 mg via INTRAVENOUS
  Filled 2024-05-02: qty 2

## 2024-05-02 MED ORDER — SODIUM CHLORIDE 0.9 % IV BOLUS
1000.0000 mL | Freq: Once | INTRAVENOUS | Status: AC
  Administered 2024-05-02: 1000 mL via INTRAVENOUS

## 2024-05-02 MED ORDER — MORPHINE SULFATE (PF) 4 MG/ML IV SOLN
4.0000 mg | Freq: Once | INTRAVENOUS | Status: AC
  Administered 2024-05-02: 4 mg via INTRAVENOUS
  Filled 2024-05-02: qty 1

## 2024-05-02 MED ORDER — HYDROCODONE-ACETAMINOPHEN 5-325 MG PO TABS: 1.0000 | ORAL_TABLET | Freq: Four times a day (QID) | ORAL | 0 refills | Status: AC | PRN

## 2024-05-02 NOTE — ED Triage Notes (Signed)
 Pt c/o lower abdominal cramping and low back pain x4 days ago.  Pain score 8/10.  Hx of chronic low back pain.  Pt reports she had a baby in July 2025 and was told her cervix never fully closed.   Pt reports she started her menstrual cycle x2 days ago and is having heavy bleeding.  Pt reports she is on a birth control pill.

## 2024-05-02 NOTE — Discharge Instructions (Signed)
 You were seen in the emergency department for severe lower abdominal pain and back pain and heavy vaginal bleeding.  Your blood work showed your hemoglobin (red blood cells) to be stable.  Please use ibuprofen  3 times a day with food in your stomach.  We are prescribing you a short course of pain medicine to use as needed.  Follow-up with your gynecology team.  Return if any worsening or concerning symptoms

## 2024-05-02 NOTE — ED Provider Notes (Signed)
 " [place] EMERGENCY DEPARTMENT AT [facility] Provider Note   CSN: [id number] Arrival date & time: 05/02/24  1247     Patient presents with: Abdominal Pain and Back Pain   Pamela Gallagher is a 39 y.o. female.  She has a history of endometriosis and gets severe pain with her menstrual cycle.  She started a period 2 days ago and the bleeding has been heavy associated with clots.  She had a syncopal event today while getting off the couch.  Has severe lower back pain and lower abdominal pain.  Has been nauseous due to the pain and unable to eat.  Feeling very lightheaded.  She has not taken anything for her symptoms.  She called her OB who recommended she come here for further evaluation because they could not give her an appointment for another month.   The history is provided by the patient.  Abdominal Pain Pain location:  Suprapubic Pain quality: aching   Pain radiates to:  Back Pain severity:  Severe Onset quality:  Gradual Duration:  3 days Timing:  Constant Progression:  Worsening Chronicity:  Recurrent Relieved by:  None tried Associated symptoms: nausea and vaginal bleeding   Associated symptoms: no constipation, no cough, no diarrhea, no dysuria, no fever, no hematemesis, no hematochezia, no hematuria, no shortness of breath and no vomiting   Back Pain Associated symptoms: abdominal pain   Associated symptoms: no dysuria and no fever        Prior to Admission medications  Medication Sig Start Date End Date Taking? Authorizing Provider  albuterol  (VENTOLIN  HFA) 108 (90 Base) MCG/ACT inhaler Inhale 2 puffs into the lungs every 6 (six) hours as needed for wheezing or shortness of breath. 03/09/24   [facility] Pool, NP  azelastine  (ASTELIN ) 0.1 % nasal spray Place 1 spray into both nostrils 2 (two) times daily. Use in each nostril as directed 03/07/24   Deitra Morton Sebastian Pool, NP  doxycycline  (VIBRAMYCIN ) 100 MG capsule Take 1 capsule (100 mg total) by  mouth 2 (two) times daily. 03/08/24   [facility] Sebastian Pool, NP  guaifenesin  (HUMIBID E) 400 MG TABS tablet Take 1 tablet (400 mg total) by mouth every 6 (six) hours as needed. 03/07/24   [facility] Sebastian Pool, NP  hydrOXYzine  (VISTARIL ) 25 MG capsule TAKE 1 CAPSULE (25 MG TOTAL) BY MOUTH EVERY 8 (EIGHT) HOURS AS NEEDED. 01/12/24   [facility] Sebastian Pool, NP  ondansetron  (ZOFRAN -ODT) 4 MG disintegrating tablet Take 1 tablet (4 mg total) by mouth every 8 (eight) hours as needed for nausea or vomiting. 01/11/24   Garrick Charleston, MD  predniSONE  (STERAPRED UNI-PAK 21 TAB) 10 MG (21) TBPK tablet As directed x 6 days 03/07/24   Deitra Morton Sebastian Pool, NP  Prenatal Vit-Fe Fumarate-FA (PRENATAL MULTIVITAMIN) TABS tablet Take 1 tablet by mouth daily. 11/12/23   [facility] Morton Sebastian Pool, NP  sertraline  (ZOLOFT ) 50 MG tablet Take 1 tablet (50 mg total) by mouth daily. 12/28/23   [facility] Morton Sebastian Pool, NP  tiZANidine  (ZANAFLEX ) 4 MG tablet Take 1 tablet (4 mg total) by mouth every 6 (six) hours as needed for muscle spasms. 11/17/23   [facility] Sebastian Pool, NP    Allergies: Clindamycin/lincomycin, Penicillins, and Sulfa antibiotics    Review of Systems  Constitutional:  Negative for fever.  Respiratory:  Negative for cough and shortness of breath.   Gastrointestinal:  Positive for abdominal pain and nausea. Negative for constipation, diarrhea, hematemesis, hematochezia and  vomiting.  Genitourinary:  Positive for vaginal bleeding. Negative for dysuria and hematuria.  Musculoskeletal:  Positive for back pain.    Updated Vital Signs BP 128/88 (BP Location: Right Arm)   Pulse 95   Temp (!) 97.5 F (36.4 C) (Oral)   Resp 20   Ht 5' 3 (1.6 m)   Wt 59.9 kg   LMP 04/30/2024 (Exact Date)   SpO2 100%   BMI 23.38 kg/m   Physical Exam Vitals and nursing note reviewed.  Constitutional:      General: She is not in acute distress.    Appearance: Normal appearance. She is well-developed.   HENT:     Head: Normocephalic and atraumatic.  Eyes:     Conjunctiva/sclera: Conjunctivae normal.  Cardiovascular:     Rate and Rhythm: Normal rate and regular rhythm.     Heart sounds: No murmur heard. Pulmonary:     Effort: Pulmonary effort is normal. No respiratory distress.     Breath sounds: Normal breath sounds. No stridor. No wheezing.  Abdominal:     Palpations: Abdomen is soft.     Tenderness: There is abdominal tenderness in the suprapubic area. There is no guarding or rebound.  Musculoskeletal:        General: No tenderness or deformity. Normal range of motion.     Cervical back: Neck supple.  Skin:    General: Skin is warm and dry.  Neurological:     General: No focal deficit present.     Mental Status: She is alert.     GCS: GCS eye subscore is 4. GCS verbal subscore is 5. GCS motor subscore is 6.     (all labs ordered are listed, but only abnormal results are displayed) Labs Reviewed  URINALYSIS, ROUTINE W REFLEX MICROSCOPIC - Abnormal; Notable for the following components:      Result Value   APPearance HAZY (*)    Hgb urine dipstick LARGE (*)    Leukocytes,Ua TRACE (*)    All other components within normal limits  CBC WITH DIFFERENTIAL/PLATELET - Abnormal; Notable for the following components:   RBC 5.17 (*)    Hemoglobin 15.6 (*)    HCT 47.0 (*)    All other components within normal limits  LIPASE, BLOOD  COMPREHENSIVE METABOLIC PANEL WITH GFR  POC URINE PREG, ED    EKG: None  Radiology: No results found.   Procedures   Medications Ordered in the ED  morphine  (PF) 4 MG/ML injection 4 mg (4 mg Intravenous Given 05/02/24 1340)  sodium chloride  0.9 % bolus 1,000 mL (0 mLs Intravenous Stopped 05/02/24 1441)  ondansetron  (ZOFRAN ) injection 4 mg (4 mg Intravenous Given 05/02/24 1340)    Clinical Course as of 05/02/24 1715  Mon May 02, 2024  1427 Patient's lab work including her hemoglobin are unremarkable.  She is feeling much better now.  I do  not think she needs an ultrasound as she just had one a few months ago that was unremarkable for similar symptoms.  She is comfortable plan for discharge and outpatient follow-up with her GYN. [MB]    Clinical Course User Index [MB] Towana Ozell BROCKS, MD                                 Medical Decision Making Amount and/or Complexity of Data Reviewed Labs: ordered.  Risk Prescription drug management.   This patient complains of lower abdominal and back pain,  heavy vaginal bleeding; this involves an extensive number of treatment Options and is a complaint that carries with it a high risk of complications and morbidity. The differential includes menorrhagia, dysfunctional bleeding, pregnancy, ectopic, endometriosis  I ordered, reviewed and interpreted labs, which included CBC with stable hemoglobin, chemistries unremarkable, urinalysis without clear signs of infection, pregnancy negative I ordered medication IV fluids and pain medicine nausea medicine and reviewed PMP when indicated. Previous records obtained and reviewed in epic including similar ED visit in September with negative pelvic ultrasound Social determinants considered, tobacco use housing insecurity physically inactive Critical Interventions: None  After the interventions stated above, I reevaluated the patient and found patient to be feeling much better and well-appearing with benign exam Admission and further testing considered, she is comfortable plan for discharge.  Will cover her short term with some pain medicine.  Recommended NSAIDs.  Close follow-up with gynecology.  Return instructions discussed.      Final diagnoses:  Pelvic pain in female  Menorrhagia with regular cycle    ED Discharge Orders          Ordered    HYDROcodone -acetaminophen  (NORCO/VICODIN) 5-325 MG tablet  Every 6 hours PRN        05/02/24 1429               Towana Ozell BROCKS, MD 05/02/24 1717  "
